# Patient Record
Sex: Female | Born: 1950 | Race: White | Hispanic: No | Marital: Married
Health system: Midwestern US, Community
[De-identification: ages and names within clinical notes are randomized; demographics above are authoritative.]

## PROBLEM LIST (undated history)

## (undated) DIAGNOSIS — N390 Urinary tract infection, site not specified: Secondary | ICD-10-CM

## (undated) DIAGNOSIS — I10 Essential (primary) hypertension: Secondary | ICD-10-CM

## (undated) DIAGNOSIS — E119 Type 2 diabetes mellitus without complications: Secondary | ICD-10-CM

## (undated) DIAGNOSIS — E1121 Type 2 diabetes mellitus with diabetic nephropathy: Secondary | ICD-10-CM

## (undated) DIAGNOSIS — F419 Anxiety disorder, unspecified: Secondary | ICD-10-CM

## (undated) DIAGNOSIS — E785 Hyperlipidemia, unspecified: Secondary | ICD-10-CM

## (undated) DIAGNOSIS — Z1211 Encounter for screening for malignant neoplasm of colon: Secondary | ICD-10-CM

## (undated) DIAGNOSIS — E079 Disorder of thyroid, unspecified: Secondary | ICD-10-CM

## (undated) DIAGNOSIS — H9203 Otalgia, bilateral: Secondary | ICD-10-CM

## (undated) LAB — HM MAMMOGRAPHY

## (undated) MED ORDER — HYDROCODONE 10 MG-CHLORPHENIRAMINE 8 MG/5 ML ORAL SUSP EXTEND.REL 12HR
10-8 mg/5 mL | Freq: Two times a day (BID) | ORAL | Status: DC | PRN
Start: ? — End: 2012-12-27

## (undated) MED ORDER — AMOXICILLIN CLAVULANATE 875 MG-125 MG TAB
875-125 mg | ORAL_TABLET | Freq: Two times a day (BID) | ORAL | Status: AC
Start: ? — End: 2013-01-11

## (undated) MED ORDER — HYDROCHLOROTHIAZIDE 25 MG TAB
25 mg | ORAL_TABLET | ORAL | Status: DC
Start: ? — End: 2013-11-10

## (undated) MED ORDER — CIPROFLOXACIN 500 MG TAB
500 mg | ORAL_TABLET | Freq: Two times a day (BID) | ORAL | Status: DC
Start: ? — End: 2013-01-01

## (undated) MED ORDER — METFORMIN SR 500 MG 24 HR TABLET
500 mg | ORAL_TABLET | Freq: Two times a day (BID) | ORAL | Status: DC
Start: ? — End: 2012-10-14

## (undated) MED ORDER — AZITHROMYCIN 250 MG TAB
250 mg | ORAL_TABLET | ORAL | Status: DC
Start: ? — End: 2012-10-21

## (undated) MED ORDER — LEVOTHYROXINE 50 MCG TAB
50 mcg | ORAL_TABLET | ORAL | Status: DC
Start: ? — End: 2013-11-10

## (undated) MED ORDER — ATORVASTATIN 40 MG TAB
40 mg | ORAL_TABLET | Freq: Every day | ORAL | Status: DC
Start: ? — End: 2012-10-21

## (undated) MED ORDER — AMLODIPINE 10 MG TAB
10 mg | ORAL_TABLET | ORAL | Status: DC
Start: ? — End: 2013-11-10

## (undated) MED ORDER — FLUOXETINE 20 MG CAP
20 mg | ORAL_CAPSULE | Freq: Two times a day (BID) | ORAL | Status: DC
Start: ? — End: 2013-11-10

## (undated) MED ORDER — AMLODIPINE 10 MG TAB
10 mg | ORAL_TABLET | Freq: Every day | ORAL | Status: DC
Start: ? — End: 2012-12-01

## (undated) MED ORDER — ALBUTEROL SULFATE HFA 90 MCG/ACTUATION AEROSOL INHALER
90 mcg/actuation | RESPIRATORY_TRACT | Status: DC | PRN
Start: ? — End: 2014-12-21

## (undated) MED ORDER — NITROFURANTOIN MACROCRYSTAL 100 MG CAP
100 mg | ORAL_CAPSULE | Freq: Every evening | ORAL | Status: DC
Start: ? — End: 2013-01-01

## (undated) MED ORDER — METHYLPREDNISOLONE 4 MG TABS IN A DOSE PACK
4 mg | PACK | ORAL | Status: DC
Start: ? — End: 2012-10-27

## (undated) MED ORDER — CEFTRIAXONE 1 GRAM SOLUTION FOR INJECTION
1 gram | Freq: Once | INTRAMUSCULAR | Status: AC
Start: ? — End: 2012-10-27

## (undated) MED ORDER — FLUTICASONE 50 MCG/ACTUATION NASAL SPRAY, SUSP
50 mcg/actuation | NASAL | Status: DC
Start: ? — End: 2014-12-21

## (undated) MED ORDER — LOVASTATIN 40 MG TAB
40 mg | ORAL_TABLET | Freq: Every evening | ORAL | Status: DC
Start: ? — End: 2012-10-07

## (undated) MED ORDER — LEVOFLOXACIN 750 MG TAB
750 mg | ORAL_TABLET | Freq: Every day | ORAL | Status: DC
Start: ? — End: 2012-10-31

## (undated) MED ORDER — HYDROCHLOROTHIAZIDE 25 MG TAB
25 mg | ORAL_TABLET | Freq: Every day | ORAL | Status: DC
Start: ? — End: 2012-12-01

## (undated) MED ORDER — METOPROLOL SUCCINATE SR 50 MG 24 HR TAB
50 mg | ORAL_TABLET | ORAL | Status: DC
Start: ? — End: 2013-11-10

## (undated) MED ORDER — LEVOFLOXACIN 750 MG TAB
750 mg | ORAL_TABLET | Freq: Every day | ORAL | Status: AC
Start: ? — End: 2012-10-26

## (undated) MED ORDER — PHENAZOPYRIDINE 200 MG TAB
200 mg | ORAL_TABLET | Freq: Three times a day (TID) | ORAL | Status: AC | PRN
Start: ? — End: 2013-01-04

## (undated) MED ORDER — METOPROLOL SUCCINATE SR 50 MG 24 HR TAB
50 mg | ORAL_TABLET | Freq: Every day | ORAL | Status: DC
Start: ? — End: 2012-12-01

## (undated) MED ORDER — PHENAZOPYRIDINE 200 MG TAB
200 mg | ORAL_TABLET | Freq: Three times a day (TID) | ORAL | Status: AC | PRN
Start: ? — End: 2012-12-30

## (undated) MED ORDER — METFORMIN SR 500 MG 24 HR TABLET
500 mg | ORAL_TABLET | Freq: Two times a day (BID) | ORAL | Status: DC
Start: ? — End: 2013-11-10

## (undated) MED ORDER — LEVOTHYROXINE 50 MCG TAB
50 mcg | ORAL_TABLET | Freq: Every day | ORAL | Status: DC
Start: ? — End: 2012-12-01

---

## 2012-09-24 NOTE — Telephone Encounter (Signed)
One year since visit, needs fasting labs.

## 2012-09-25 NOTE — Telephone Encounter (Signed)
Spoke with patient.

## 2012-10-06 LAB — AMB POC COMPLETE CBC,AUTOMATED ENTER
ABS. GRANS (POC): 4.5 10*3/uL (ref 2.2–4.8)
ABS. LYMPHS (POC): 2.6 10*3/uL (ref 1.3–2.9)
ABS. MONOS (POC): 0.5 10*3/uL (ref 0.3–0.8)
GRANULOCYTES (POC): 59.4 % (ref 43–65)
HCT (POC): 41.7 % (ref 37–47)
HGB (POC): 14.4 g/dL (ref 12–16)
LYMPHOCYTES (POC): 34.4 % (ref 20.5–45.5)
MCH (POC): 31.5 pg (ref 28–32)
MCHC (POC): 34.4 g/dL (ref 32–37)
MCV (POC): 91.5 fL (ref 81–99)
MONOCYTES (POC): 6.2 % (ref 5.5–11.7)
MPV (POC): 7.3 fL (ref 7–10.4)
PLATELET (POC): 232 10*3/uL (ref 150–450)
RBC (POC): 4.56 10*6/uL (ref 4.2–5.4)
RDW (POC): 12.4 % (ref 11.5–15.5)
WBC (POC): 7.5 10*3/uL (ref 4.5–10.5)

## 2012-10-06 NOTE — Progress Notes (Signed)
HISTORY OF PRESENT ILLNESS  Madison Peters is a 62 y.o. female.  HPIj Here for her annual exam. No chest pain, dyspnea, palpitations, edema, hypoglycemia. Compliant with meds, no side effects.    ROS as above  Past Medical History   Diagnosis Date   ??? Emphysema    ??? Hypertension    ??? Anxiety    ??? Thyroid disease      hypothryoidism   ??? Diabetes    ??? Headache    ??? Dyspepsia    ??? IGT (impaired glucose tolerance)    ??? Lipoma      Left calf   ??? Kidney stones      No current outpatient prescriptions on file prior to visit.     No current facility-administered medications on file prior to visit.     Allergies   Allergen Reactions   ??? Ace Inhibitors Cough   ??? Erythromycin Unknown (comments)   ??? Pcn (Penicillins) Unknown (comments)   ??? Sulfa (Sulfonamide Antibiotics) Nausea and Vomiting     BP 120/80   Pulse 64   Temp(Src) 99.1 ??F (37.3 ??C) (Oral)   Resp 12   Ht 5\' 5"  (1.651 m)   Wt 180 lb (81.647 kg)   BMI 29.95 kg/m2    Physical Exam   Vitals reviewed.  Constitutional: She appears well-developed and well-nourished. No distress.   HENT:   Head: Normocephalic and atraumatic.   Right Ear: External ear normal.   Left Ear: External ear normal.   Mouth/Throat: Oropharynx is clear and moist.   Nose clear mucus   Neck: No thyromegaly present.   Cardiovascular: Normal rate, regular rhythm, normal heart sounds and intact distal pulses.  Exam reveals no gallop.    Pulmonary/Chest: Effort normal and breath sounds normal. She has no wheezes. She has no rales.   Abdominal: Soft. Bowel sounds are normal.   Lymphadenopathy:     She has no cervical adenopathy.       ASSESSMENT and PLAN  Encounter Diagnoses   Name Primary?   ??? Diabetes Yes   ??? Thyroid disease    ??? Emphysema    ??? Hypertension    ??? Other and unspecified hyperlipidemia      Orders Placed This Encounter   ??? LIPID PANEL   ??? METABOLIC PANEL, COMPREHENSIVE   ??? HEMOGLOBIN A1C   ??? THYROID PANEL, FREE T4/TSH   ??? POC CBC w AUTO DIFF  (16109)   ??? lovastatin (MEVACOR) 40 mg tablet      Sig: Take 1 Tab by mouth nightly.     Dispense:  30 Tab     Refill:  11   ??? levothyroxine (SYNTHROID) 50 mcg tablet     Sig: Take 1 Tab by mouth Daily (before breakfast).     Dispense:  30 Tab     Refill:  11   ??? amLODIPine (NORVASC) 10 mg tablet     Sig: Take 1 Tab by mouth daily.     Dispense:  30 Tab     Refill:  11   ??? metoprolol succinate (TOPROL XL) 50 mg XL tablet     Sig: Take 1 Tab by mouth daily.     Dispense:  30 Tab     Refill:  11   ??? FLUoxetine (PROZAC) 20 mg capsule     Sig: Take 1 Cap by mouth two (2) times a day.     Dispense:  60 Cap     Refill:  11   ???  hydrochlorothiazide (HYDRODIURIL) 25 mg tablet     Sig: Take 1 Tab by mouth daily.     Dispense:  30 Tab     Refill:  11

## 2012-10-07 LAB — METABOLIC PANEL, COMPREHENSIVE
A-G Ratio: 2.5 (ref 1.1–2.5)
ALT (SGPT): 13 IU/L (ref 0–32)
AST (SGOT): 15 IU/L (ref 0–40)
Albumin: 4.9 g/dL — ABNORMAL HIGH (ref 3.6–4.8)
Alk. phosphatase: 65 IU/L (ref 39–117)
BUN/Creatinine ratio: 31 — ABNORMAL HIGH (ref 11–26)
BUN: 20 mg/dL (ref 8–27)
Bilirubin, total: 0.3 mg/dL (ref 0.0–1.2)
CO2: 18 mmol/L — ABNORMAL LOW (ref 19–28)
Calcium: 9.6 mg/dL (ref 8.6–10.2)
Chloride: 101 mmol/L (ref 97–108)
Creatinine: 0.64 mg/dL (ref 0.57–1.00)
GFR est AA: 112 mL/min/{1.73_m2} (ref >59)
GFR est non-AA: 97 mL/min/{1.73_m2} (ref >59)
GLOBULIN, TOTAL: 2 g/dL (ref 1.5–4.5)
Glucose: 219 mg/dL — ABNORMAL HIGH (ref 65–99)
Potassium: 4.1 mmol/L (ref 3.5–5.2)
Protein, total: 6.9 g/dL (ref 6.0–8.5)
Sodium: 139 mmol/L (ref 134–144)

## 2012-10-07 LAB — LIPID PANEL
Cholesterol, total: 265 mg/dL — ABNORMAL HIGH (ref 100–199)
HDL Cholesterol: 23 mg/dL — ABNORMAL LOW (ref >39)
Triglyceride: 1039 mg/dL — CR (ref 0–149)

## 2012-10-07 LAB — CVD REPORT: PDF IMAGE: 0

## 2012-10-07 LAB — HEMOGLOBIN A1C WITH EAG: Hemoglobin A1c: 8.9 % — ABNORMAL HIGH (ref 4.8–5.6)

## 2012-10-07 LAB — TSH AND FREE T4
T4, Free: 1.18 ng/dL (ref 0.82–1.77)
TSH: 1.85 u[IU]/mL (ref 0.450–4.500)

## 2012-10-07 NOTE — Telephone Encounter (Signed)
Sugars running high, will need to start metformin BID. Chol and Trig. VERY high, needs to switch from mevacor to atorvastatin and recheck in 3 months

## 2012-10-07 NOTE — Telephone Encounter (Signed)
LMFCB.

## 2012-10-08 NOTE — Telephone Encounter (Signed)
Left message for call back

## 2012-10-09 NOTE — Telephone Encounter (Signed)
Patient notified about lab results and needs appt to discuss medications and abnormal labs

## 2012-10-11 NOTE — Telephone Encounter (Signed)
Notified pt of chol results

## 2012-10-11 NOTE — Telephone Encounter (Signed)
CHol and triglycerides are very high, take the atorvastatin and recheck in 3 months

## 2012-10-14 NOTE — Progress Notes (Signed)
HISTORY OF PRESENT ILLNESS  Madison Peters is a 62 y.o. female.  HPI Several days of productive cough with green sputum, sinus congestion fever to 102, chills, and diarrhea. Here to discuss her test results.    ROS as Sheila Oats  Past Medical History   Diagnosis Date   ??? Emphysema    ??? Hypertension    ??? Anxiety    ??? Thyroid disease      hypothryoidism   ??? Diabetes    ??? Headache    ??? Dyspepsia    ??? IGT (impaired glucose tolerance)    ??? Lipoma      Left calf   ??? Kidney stones      Current Outpatient Prescriptions on File Prior to Visit   Medication Sig Dispense Refill   ??? levothyroxine (SYNTHROID) 50 mcg tablet Take 1 Tab by mouth Daily (before breakfast).  30 Tab  11   ??? amLODIPine (NORVASC) 10 mg tablet Take 1 Tab by mouth daily.  30 Tab  11   ??? metoprolol succinate (TOPROL XL) 50 mg XL tablet Take 1 Tab by mouth daily.  30 Tab  11   ??? FLUoxetine (PROZAC) 20 mg capsule Take 1 Cap by mouth two (2) times a day.  60 Cap  11   ??? hydrochlorothiazide (HYDRODIURIL) 25 mg tablet Take 1 Tab by mouth daily.  30 Tab  11     No current facility-administered medications on file prior to visit.     Allergies   Allergen Reactions   ??? Ace Inhibitors Cough   ??? Erythromycin Unknown (comments)   ??? Pcn (Penicillins) Unknown (comments)   ??? Sulfa (Sulfonamide Antibiotics) Nausea and Vomiting     BP 130/80   Pulse 82   Temp(Src) 97.8 ??F (36.6 ??C) (Oral)   Ht 5\' 5"  (1.651 m)   Wt 183 lb (83.008 kg)   BMI 30.45 kg/m2    Physical Exam   Constitutional: She appears well-developed and well-nourished. No distress.   Neck: No thyromegaly present.   Cardiovascular: Normal rate, regular rhythm and normal heart sounds.    Pulmonary/Chest: Effort normal. She has decreased breath sounds in the left middle field and the left lower field. She has wheezes in the left middle field and the left lower field.   Lymphadenopathy:     She has no cervical adenopathy.       ASSESSMENT and PLAN  Encounter Diagnoses   Name Primary?   ??? Pneumonia, organism unspecified  Yes   ??? Emphysema    ??? Diabetes    ??? Other and unspecified hyperlipidemia      Orders Placed This Encounter   ??? azithromycin (ZITHROMAX) 250 mg tablet     Sig: Take 2 tablets today, then take 1 tablet daily     Dispense:  6 Tab     Refill:  0   ??? atorvastatin (LIPITOR) 40 mg tablet     Sig: Take 1 Tab by mouth daily.     Dispense:  30 Tab     Refill:  11   ??? metFORMIN ER (GLUCOPHAGE XR) 500 mg tablet     Sig: Take 1 Tab by mouth two (2) times a day.     Dispense:  60 Tab     Refill:  11   Robitussin AC   6 oz   1 RF

## 2012-10-14 NOTE — Patient Instructions (Addendum)
MyChart Activation    Thank you for requesting access to MyChart. Please follow the instructions below to securely access and download your online medical record. MyChart allows you to send messages to your doctor, view your test results, renew your prescriptions, schedule appointments, and more.    How Do I Sign Up?    1. In your internet browser, go to [email or web address]  2. Click on the First Time User? Click Here link in the Sign In box. You will be redirect to the New Member Sign Up page.  3. Enter your MyChart Access Code exactly as it appears below. You will not need to use this code after you???ve completed the sign-up process. If you do not sign up before the expiration date, you must request a new code.    MyChart Access Code: [email or web address]  Expires: 01/12/2013  4:05 PM (This is the date your MyChart access code will expire)    4. Enter the last four digits of your Social Security Number (xxxx) and Date of Birth (mm/dd/yyyy) as indicated and click Submit. You will be taken to the next sign-up page.  5. Create a MyChart ID. This will be your MyChart login ID and cannot be changed, so think of one that is secure and easy to remember.  6. Create a MyChart password. You can change your password at any time.  7. Enter your Password Reset Question and Answer. This can be used at a later time if you forget your password.   8. Enter your e-mail address. You will receive e-mail notification when new information is available in MyChart.  9. Click Sign Up. You can now view and download portions of your medical record.  10. Click the Download Summary menu link to download a portable copy of your medical information.    Additional Information    If you have questions, please visit the Frequently Asked Questions section of the MyChart website at [email or web address] Remember, MyChart is NOT to be used for urgent needs. For medical emergencies, dial 911.

## 2012-10-21 LAB — AMB POC COMPLETE CBC,AUTOMATED ENTER
ABS. GRANS (POC): 7.1 10*3/uL — AB (ref 2.2–4.8)
ABS. LYMPHS (POC): 2.9 10*3/uL (ref 1.3–2.9)
ABS. MONOS (POC): 0.4 10*3/uL (ref 0.3–0.8)
GRANULOCYTES (POC): 68.5 % — AB (ref 43–65)
HCT (POC): 38.4 % (ref 37–47)
HGB (POC): 12.7 g/dL (ref 12–16)
LYMPHOCYTES (POC): 27.6 % (ref 20.5–45.5)
MCH (POC): 30.6 pg (ref 28–32)
MCHC (POC): 33 g/dL (ref 32–37)
MCV (POC): 92.7 fL (ref 81–99)
MONOCYTES (POC): 3.9 % — AB (ref 5.5–11.7)
MPV (POC): 7.1 fL (ref 7–10.4)
PLATELET (POC): 338 10*3/uL (ref 150–450)
RBC (POC): 4.14 10*6/uL — AB (ref 4.2–5.4)
RDW (POC): 12.5 % (ref 11.5–15.5)
WBC (POC): 10.4 10*3/uL (ref 4.5–10.5)

## 2012-10-21 NOTE — Patient Instructions (Addendum)
MyChart Activation    Thank you for requesting access to MyChart. Please follow the instructions below to securely access and download your online medical record. MyChart allows you to send messages to your doctor, view your test results, renew your prescriptions, schedule appointments, and more.    How Do I Sign Up?    1. In your internet browser, go to [email or web address]  2. Click on the First Time User? Click Here link in the Sign In box. You will be redirect to the New Member Sign Up page.  3. Enter your MyChart Access Code exactly as it appears below. You will not need to use this code after you???ve completed the sign-up process. If you do not sign up before the expiration date, you must request a new code.    MyChart Access Code: [email or web address]  Expires: 01/12/2013  4:05 PM (This is the date your MyChart access code will expire)    4. Enter the last four digits of your Social Security Number (xxxx) and Date of Birth (mm/dd/yyyy) as indicated and click Submit. You will be taken to the next sign-up page.  5. Create a MyChart ID. This will be your MyChart login ID and cannot be changed, so think of one that is secure and easy to remember.  6. Create a MyChart password. You can change your password at any time.  7. Enter your Password Reset Question and Answer. This can be used at a later time if you forget your password.   8. Enter your e-mail address. You will receive e-mail notification when new information is available in MyChart.  9. Click Sign Up. You can now view and download portions of your medical record.  10. Click the Download Summary menu link to download a portable copy of your medical information.    Additional Information    If you have questions, please visit the Frequently Asked Questions section of the MyChart website at [email or web address] Remember, MyChart is NOT to be used for urgent needs. For medical emergencies, dial 911.

## 2012-10-21 NOTE — Progress Notes (Signed)
HISTORY OF PRESENT ILLNESS  Madison Peters is a 62 y.o. female.  Chief Complaint   Patient presents with   ??? Pneumonia     has finished Z-pack and she is not better   ??? Diabetes     has not started metformin yet   ??? Cholesterol Problem     has been taking lovastatin and was changed to lipitor; wanted to know if she should change       HPI  Saw Dr. Grier Rocher, diagnosed her with Pneumonia, got Zpak and cough med Robitussin AC, but it burned her throat and she stopped it  Pt prefers Tussionex  Still with same symptoms, cough productive with dark phlegm, now a little lighter, yellow  Current smoker although has cut down    DM  Had BW recently, is changing her diet and will start Metformin    Hyperlipidemia  Did not take Lovastatin regularly in the past    Review of Systems   Constitutional: Positive for fever (101).   HENT: Positive for ear pain, congestion, sore throat and ear discharge.    Respiratory: Positive for cough, sputum production and wheezing. Negative for shortness of breath.    Gastrointestinal: Negative for nausea, vomiting and diarrhea.   Neurological: Negative for headaches.     Past Medical History   Diagnosis Date   ??? Emphysema    ??? Hypertension    ??? Anxiety    ??? Thyroid disease      hypothryoidism   ??? Diabetes    ??? Headache    ??? Dyspepsia    ??? IGT (impaired glucose tolerance)    ??? Lipoma      Left calf   ??? Kidney stones      Current Outpatient Prescriptions   Medication Sig Dispense Refill   ??? lovastatin (MEVACOR) 40 mg tablet Take 40 mg by mouth nightly.       ??? chlorpheniramine-HYDROcodone (TUSSIONEX) 8-10 mg/5 mL suspension Take 5 mL by mouth every twelve (12) hours as needed for Cough.  100 mL  0   ??? levofloxacin (LEVAQUIN) 750 mg tablet Take 1 Tab by mouth daily for 5 days.  5 Tab  0   ??? methylPREDNISolone (MEDROL DOSEPACK) 4 mg tablet Per dose pack instructions  1 Package  0   ??? albuterol (PROVENTIL HFA, VENTOLIN HFA) 90 mcg/actuation inhaler Take 1 Puff by inhalation every four (4) hours as  needed for Wheezing.  1 Inhaler  0   ??? metFORMIN ER (GLUCOPHAGE XR) 500 mg tablet Take 1 Tab by mouth two (2) times a day.  60 Tab  11   ??? levothyroxine (SYNTHROID) 50 mcg tablet Take 1 Tab by mouth Daily (before breakfast).  30 Tab  11   ??? amLODIPine (NORVASC) 10 mg tablet Take 1 Tab by mouth daily.  30 Tab  11   ??? metoprolol succinate (TOPROL XL) 50 mg XL tablet Take 1 Tab by mouth daily.  30 Tab  11   ??? FLUoxetine (PROZAC) 20 mg capsule Take 1 Cap by mouth two (2) times a day.  60 Cap  11   ??? hydrochlorothiazide (HYDRODIURIL) 25 mg tablet Take 1 Tab by mouth daily.  30 Tab  11     Allergies   Allergen Reactions   ??? Ace Inhibitors Cough   ??? Erythromycin Unknown (comments)   ??? Pcn (Penicillins) Unknown (comments)   ??? Sulfa (Sulfonamide Antibiotics) Nausea and Vomiting     BP 130/80   Pulse 80  Temp(Src) 98.9 ??F (37.2 ??C) (Oral)   Resp 20   Ht 5\' 5"  (1.651 m)   Wt 180 lb (81.647 kg)   BMI 29.95 kg/m2   SpO2 90%    Physical Exam   Constitutional: She is oriented to person, place, and time. She appears well-developed and well-nourished. No distress.   HENT:   Head: Normocephalic and atraumatic.   Nose: Nose normal.   Mouth/Throat: Oropharynx is clear and moist. No oropharyngeal exudate.   Cerumen bilat     Eyes: Conjunctivae and EOM are normal.   Cardiovascular: Normal rate, regular rhythm and normal heart sounds.    Pulmonary/Chest: Effort normal. She has wheezes. She has no rales.   Lymphadenopathy:     She has no cervical adenopathy.   Neurological: She is alert and oriented to person, place, and time.   Skin: Skin is warm and dry.   Psychiatric: She has a normal mood and affect.     Recent Results (from the past 12 hour(s))   AMB POC COMPLETE CBC,AUTOMATED ENTER    Collection Time     10/21/12 11:15 AM       Result Value Range    WBC (POC) 10.4  4.5 - 10.5 10^3/ul    RBC (POC) 4.14 (*) 4.2 - 5.4 10^6/ul    HGB (POC) 12.7  12 - 16 g/dL    HCT (POC) 16.1  37 - 47 %    MCV (POC) 92.7  81 - 99 fL    MCH (POC) 30.6   28 - 32 pg    MCHC (POC) 33.0  32 - 37 g/dL    RDW (POC) 09.6  04.5 - 15.5 %    PLATELET (POC) 338  150 - 450 10^3/ul    MPV (POC) 7.1  7 - 10.4 fL    LYMPHOCYTES (POC) 27.6  20.5 - 45.5 %    ABS. LYMPHS (POC) 2.9  1.3 - 2.9 10^3/ul    MONOCYTES (POC) 3.9 (*) 5.5 - 11.7 %    ABS. MONOS (POC) 0.4  0.3 - 0.8 10^3/ul    GRANULOCYTES (POC) 68.5 (*) 43 - 65 %    ABS. GRANS (POC) 7.1 (*) 2.2 - 4.8 10^3/ul       ASSESSMENT and PLAN  1. Pneumonia  AMB POC COMPLETE CBC,AUTOMATED ENTER, COLLECTION CAPILLARY BLOOD SPECIMEN, chlorpheniramine-HYDROcodone (TUSSIONEX) 8-10 mg/5 mL suspension, levofloxacin (LEVAQUIN) 750 mg tablet   2. RAD (reactive airway disease)  INHAL RX, AIRWAY OBST/DX SPUTUM INDUCT, methylPREDNISolone (MEDROL DOSEPACK) 4 mg tablet, albuterol (PROVENTIL HFA, VENTOLIN HFA) 90 mcg/actuation inhaler   3. Impacted cerumen of both ears  REMOVE IMPACTED EAR WAX   4. Diabetes  Start Metformin and cont with healthy diet and low Conc Sweets   5. Hyperlipidemia  Start Lipitor since more effective   6. Tobacco abuse  Urged to quit

## 2012-10-25 NOTE — Telephone Encounter (Signed)
Will need f/u visit to see if pneumonia cleared and to adjust diabetes medication

## 2012-10-27 LAB — AMB POC COMPLETE CBC,AUTOMATED ENTER
ABS. GRANS (POC): 7.6 10*3/uL — AB (ref 2.2–4.8)
ABS. LYMPHS (POC): 3.5 10*3/uL — AB (ref 1.3–2.9)
ABS. MONOS (POC): 0.4 10*3/uL (ref 0.3–0.8)
GRANULOCYTES (POC): 66 % — AB (ref 43–65)
HCT (POC): 35.9 % — AB (ref 37–47)
HGB (POC): 12 g/dL (ref 12–16)
LYMPHOCYTES (POC): 30.1 % (ref 20.5–45.5)
MCH (POC): 30.7 pg (ref 28–32)
MCHC (POC): 33.5 g/dL (ref 32–37)
MCV (POC): 91.5 fL (ref 81–99)
MONOCYTES (POC): 3.9 % — AB (ref 5.5–11.7)
MPV (POC): 7 fL (ref 7–10.4)
PLATELET (POC): 239 10*3/uL (ref 150–450)
RBC (POC): 3.92 10*6/uL — AB (ref 4.2–5.4)
RDW (POC): 12.5 % (ref 11.5–15.5)
WBC (POC): 11.5 10*3/uL — AB (ref 4.5–10.5)

## 2012-10-27 NOTE — Progress Notes (Signed)
Rocephin 1gram IM rt glute patient remained in office x ten min to observe for any reactions

## 2012-10-27 NOTE — Progress Notes (Signed)
HISTORY OF PRESENT ILLNESS  Madison Peters is a 62 y.o. female.  Chief Complaint   Patient presents with   ??? Pneumonia     f/u       HPI  Here for F/U of Pneumonia  Had Zpak and then Levaquin that she finished 2 d ago  Finished steroids yesterday  Felling better  Coughing less and no SOB now  Cutting down on smoking  Has not had CXR    Review of Systems   Constitutional: Positive for fever.   HENT: Negative for congestion.    Respiratory: Positive for cough (but better) and sputum production. Negative for shortness of breath and wheezing.    Gastrointestinal: Negative for nausea, vomiting and diarrhea.     Past Medical History   Diagnosis Date   ??? Emphysema    ??? Hypertension    ??? Anxiety    ??? Thyroid disease      hypothryoidism   ??? Diabetes    ??? Headache    ??? Dyspepsia    ??? IGT (impaired glucose tolerance)    ??? Lipoma      Left calf   ??? Kidney stones      Current Outpatient Prescriptions   Medication Sig Dispense Refill   ??? cefTRIAXone (ROCEPHIN) 1 gram injection 1 g by IntraMUSCular route once for 1 dose.  1 Vial  0   ??? levofloxacin (LEVAQUIN) 750 mg tablet Take 1 Tab by mouth daily for 5 days.  5 Tab  0   ??? chlorpheniramine-HYDROcodone (TUSSIONEX) 8-10 mg/5 mL suspension Take 5 mL by mouth every twelve (12) hours as needed for Cough.  100 mL  0   ??? albuterol (PROVENTIL HFA, VENTOLIN HFA) 90 mcg/actuation inhaler Take 1 Puff by inhalation every four (4) hours as needed for Wheezing.  1 Inhaler  0   ??? levothyroxine (SYNTHROID) 50 mcg tablet Take 1 Tab by mouth Daily (before breakfast).  30 Tab  11   ??? amLODIPine (NORVASC) 10 mg tablet Take 1 Tab by mouth daily.  30 Tab  11   ??? metoprolol succinate (TOPROL XL) 50 mg XL tablet Take 1 Tab by mouth daily.  30 Tab  11   ??? FLUoxetine (PROZAC) 20 mg capsule Take 1 Cap by mouth two (2) times a day.  60 Cap  11   ??? hydrochlorothiazide (HYDRODIURIL) 25 mg tablet Take 1 Tab by mouth daily.  30 Tab  11   ??? lovastatin (MEVACOR) 40 mg tablet Take 40 mg by mouth nightly.       ???  [EXPIRED] levofloxacin (LEVAQUIN) 750 mg tablet Take 1 Tab by mouth daily for 5 days.  5 Tab  0   ??? metFORMIN ER (GLUCOPHAGE XR) 500 mg tablet Take 1 Tab by mouth two (2) times a day.  60 Tab  11     Allergies   Allergen Reactions   ??? Ace Inhibitors Cough   ??? Erythromycin Unknown (comments)   ??? Pcn (Penicillins) Unknown (comments)   ??? Sulfa (Sulfonamide Antibiotics) Nausea and Vomiting     BP 110/72   Pulse 72   Temp(Src) 99.5 ??F (37.5 ??C) (Oral)   Resp 16   Ht 5\' 5"  (1.651 m)   Wt 182 lb (82.555 kg)   BMI 30.29 kg/m2    Physical Exam   Constitutional: She is oriented to person, place, and time. She appears well-developed and well-nourished. No distress.   HENT:   Head: Normocephalic and atraumatic.   Mouth/Throat:  Oropharynx is clear and moist. No oropharyngeal exudate.   Eyes: Conjunctivae and EOM are normal.   Cardiovascular: Normal rate, regular rhythm and normal heart sounds.    No murmur heard.  Pulmonary/Chest: Effort normal and breath sounds normal. No respiratory distress. She has no wheezes. She has no rales.   Lymphadenopathy:     She has no cervical adenopathy.   Neurological: She is alert and oriented to person, place, and time. No cranial nerve deficit.   Skin: Skin is warm and dry.   Psychiatric: She has a normal mood and affect.     Recent Results (from the past 12 hour(s))   AMB POC COMPLETE CBC,AUTOMATED ENTER    Collection Time     10/27/12 11:00 AM       Result Value Range    WBC (POC) 11.5 (*) 4.5 - 10.5 10^3/ul    RBC (POC) 3.92 (*) 4.2 - 5.4 10^6/ul    HGB (POC) 12.0  12 - 16 g/dL    HCT (POC) 16.1 (*) 37 - 47 %    MCV (POC) 91.5  81 - 99 fL    MCH (POC) 30.7  28 - 32 pg    MCHC (POC) 33.5  32 - 37 g/dL    RDW (POC) 09.6  04.5 - 15.5 %    PLATELET (POC) 239  150 - 450 10^3/ul    MPV (POC) 7.0  7 - 10.4 fL    LYMPHOCYTES (POC) 30.1  20.5 - 45.5 %    ABS. LYMPHS (POC) 3.5 (*) 1.3 - 2.9 10^3/ul    MONOCYTES (POC) 3.9 (*) 5.5 - 11.7 %    ABS. MONOS (POC) 0.4  0.3 - 0.8 10^3/ul    GRANULOCYTES  (POC) 66.0 (*) 43 - 65 %    ABS. GRANS (POC) 7.6 (*) 2.2 - 4.8 10^3/ul       ASSESSMENT and PLAN  1. Pneumonia -  WBC higher than last visit AMB POC COMPLETE CBC,AUTOMATED ENTER, COLLECTION CAPILLARY BLOOD SPECIMEN, XR CHEST PA LAT, cefTRIAXone (ROCEPHIN) 1 gram injection, CEFTRIAXONE SODIUM INJECTION PER 250 MG, PR THER/PROPH/DIAG INJECTION, SUBCUT/IM, levofloxacin (LEVAQUIN) 750 mg tablet  F/U in 4 d, RTC sooner if worse

## 2012-10-27 NOTE — Patient Instructions (Addendum)
MyChart Activation    Thank you for requesting access to MyChart. Please follow the instructions below to securely access and download your online medical record. MyChart allows you to send messages to your doctor, view your test results, renew your prescriptions, schedule appointments, and more.    How Do I Sign Up?    1. In your internet browser, go to [email or web address]  2. Click on the First Time User? Click Here link in the Sign In box. You will be redirect to the New Member Sign Up page.  3. Enter your MyChart Access Code exactly as it appears below. You will not need to use this code after you???ve completed the sign-up process. If you do not sign up before the expiration date, you must request a new code.    MyChart Access Code: [email or web address]  Expires: 01/12/2013  4:05 PM (This is the date your MyChart access code will expire)    4. Enter the last four digits of your Social Security Number (xxxx) and Date of Birth (mm/dd/yyyy) as indicated and click Submit. You will be taken to the next sign-up page.  5. Create a MyChart ID. This will be your MyChart login ID and cannot be changed, so think of one that is secure and easy to remember.  6. Create a MyChart password. You can change your password at any time.  7. Enter your Password Reset Question and Answer. This can be used at a later time if you forget your password.   8. Enter your e-mail address. You will receive e-mail notification when new information is available in MyChart.  9. Click Sign Up. You can now view and download portions of your medical record.  10. Click the Download Summary menu link to download a portable copy of your medical information.    Additional Information    If you have questions, please visit the Frequently Asked Questions section of the MyChart website at [email or web address] Remember, MyChart is NOT to be used for urgent needs. For medical emergencies, dial 911.

## 2012-10-31 LAB — AMB POC COMPLETE CBC,AUTOMATED ENTER
ABS. GRANS (POC): 10.5 10*3/uL — AB (ref 2.2–6.5)
ABS. LYMPHS (POC): 3 10*3/uL (ref 1.2–3.4)
ABS. MONOS (POC): 0.8 10*3/uL (ref 0.3–0.8)
GRANULOCYTES (POC): 73.4 % (ref 43–75.2)
HCT (POC): 41.8 % (ref 37–47)
HGB (POC): 14.1 g/dL (ref 12–16)
LYMPHOCYTES (POC): 21.3 % (ref 20.5–45.5)
MCH (POC): 30.9 pg (ref 28–32)
MCHC (POC): 33.7 g/dL (ref 32–37)
MCV (POC): 91.5 fL (ref 81–99)
MONOCYTES (POC): 5.3 % (ref 1.7–9.3)
MPV (POC): 6.9 fL — AB (ref 7.8–11.0)
PLATELET (POC): 241 10*3/uL (ref 150–450)
RBC (POC): 4.57 10*6/uL (ref 4.2–5.4)
RDW (POC): 12.4 % (ref 11.5–15.5)
WBC (POC): 14.3 10*3/uL — AB (ref 4.5–10.5)

## 2012-10-31 NOTE — Patient Instructions (Addendum)
MyChart Activation    Thank you for requesting access to MyChart. Please follow the instructions below to securely access and download your online medical record. MyChart allows you to send messages to your doctor, view your test results, renew your prescriptions, schedule appointments, and more.    How Do I Sign Up?    1. In your internet browser, go to [email or web address]  2. Click on the First Time User? Click Here link in the Sign In box. You will be redirect to the New Member Sign Up page.  3. Enter your MyChart Access Code exactly as it appears below. You will not need to use this code after you???ve completed the sign-up process. If you do not sign up before the expiration date, you must request a new code.    MyChart Access Code: [email or web address]  Expires: 01/12/2013  4:05 PM (This is the date your MyChart access code will expire)    4. Enter the last four digits of your Social Security Number (xxxx) and Date of Birth (mm/dd/yyyy) as indicated and click Submit. You will be taken to the next sign-up page.  5. Create a MyChart ID. This will be your MyChart login ID and cannot be changed, so think of one that is secure and easy to remember.  6. Create a MyChart password. You can change your password at any time.  7. Enter your Password Reset Question and Answer. This can be used at a later time if you forget your password.   8. Enter your e-mail address. You will receive e-mail notification when new information is available in MyChart.  9. Click Sign Up. You can now view and download portions of your medical record.  10. Click the Download Summary menu link to download a portable copy of your medical information.    Additional Information    If you have questions, please visit the Frequently Asked Questions section of the MyChart website at [email or web address] Remember, MyChart is NOT to be used for urgent needs. For medical emergencies, dial 911.

## 2012-10-31 NOTE — Progress Notes (Signed)
HISTORY OF PRESENT ILLNESS  Madison Peters is a 62 y.o. female.  Chief Complaint   Patient presents with   ??? Pneumonia     follow up       HPI  Finished last Levaquin this AM  Off the steroids since last visit  Felling better  CXR was negaive  Still feels tired  Uses inhaler twice a day and helping  Is trying to quit Tob      Review of Systems   Constitutional: Negative for fever and malaise/fatigue.   HENT: Negative for congestion.    Respiratory: Positive for cough (nl smokers cough) and sputum production (clear phlegm now). Negative for shortness of breath and wheezing.    Gastrointestinal: Negative for nausea, vomiting and abdominal pain.   Genitourinary: Negative.      Past Medical History   Diagnosis Date   ??? Emphysema    ??? Hypertension    ??? Anxiety    ??? Thyroid disease      hypothryoidism   ??? Diabetes    ??? Headache    ??? Dyspepsia    ??? IGT (impaired glucose tolerance)    ??? Lipoma      Left calf   ??? Kidney stones      Current Outpatient Prescriptions   Medication Sig Dispense Refill   ??? lovastatin (MEVACOR) 40 mg tablet Take 40 mg by mouth nightly.       ??? chlorpheniramine-HYDROcodone (TUSSIONEX) 8-10 mg/5 mL suspension Take 5 mL by mouth every twelve (12) hours as needed for Cough.  100 mL  0   ??? albuterol (PROVENTIL HFA, VENTOLIN HFA) 90 mcg/actuation inhaler Take 1 Puff by inhalation every four (4) hours as needed for Wheezing.  1 Inhaler  0   ??? metFORMIN ER (GLUCOPHAGE XR) 500 mg tablet Take 1 Tab by mouth two (2) times a day.  60 Tab  11   ??? levothyroxine (SYNTHROID) 50 mcg tablet Take 1 Tab by mouth Daily (before breakfast).  30 Tab  11   ??? amLODIPine (NORVASC) 10 mg tablet Take 1 Tab by mouth daily.  30 Tab  11   ??? metoprolol succinate (TOPROL XL) 50 mg XL tablet Take 1 Tab by mouth daily.  30 Tab  11   ??? FLUoxetine (PROZAC) 20 mg capsule Take 1 Cap by mouth two (2) times a day.  60 Cap  11   ??? hydrochlorothiazide (HYDRODIURIL) 25 mg tablet Take 1 Tab by mouth daily.  30 Tab  11     Allergies    Allergen Reactions   ??? Ace Inhibitors Cough   ??? Erythromycin Unknown (comments)   ??? Pcn (Penicillins) Unknown (comments)   ??? Sulfa (Sulfonamide Antibiotics) Nausea and Vomiting     BP 150/88   Pulse 70   Temp(Src) 98.1 ??F (36.7 ??C) (Oral)   Resp 18   Ht 5\' 5"  (1.651 m)   Wt 181 lb (82.101 kg)   BMI 30.12 kg/m2   SpO2 97%    Physical Exam   Constitutional: She is oriented to person, place, and time. She appears well-developed and well-nourished. No distress.   HENT:   Head: Normocephalic and atraumatic.   Mouth/Throat: No oropharyngeal exudate.   Eyes: Conjunctivae and EOM are normal.   Cardiovascular: Normal rate, regular rhythm and normal heart sounds.    No murmur heard.  Pulmonary/Chest: Effort normal and breath sounds normal. No respiratory distress. She has no wheezes. She has no rales.   Lymphadenopathy:  She has no cervical adenopathy.   Neurological: She is oriented to person, place, and time.   Skin: Skin is warm and dry.   Psychiatric: She has a normal mood and affect.     Recent Results (from the past 12 hour(s))   AMB POC COMPLETE CBC,AUTOMATED ENTER    Collection Time     10/31/12 10:15 AM       Result Value Range    WBC (POC) 14.3 (*) 4.5 - 10.5 10^3/ul    RBC (POC) 4.57  4.2 - 5.4 10^6/ul    HGB (POC) 14.1  12 - 16 g/dL    HCT (POC) 16.1  37 - 47 %    MCV (POC) 91.5  81 - 99 fL    MCH (POC) 30.9  28 - 32 pg    MCHC (POC) 33.7  32 - 37 g/dL    RDW (POC) 09.6  04.5 - 15.5 %    PLATELET (POC) 241  150 - 450 10^3/ul    MPV (POC) 6.9 (*) 7.8 - 11.0 fL    LYMPHOCYTES (POC) 21.3  20.5 - 45.5 %    ABS. LYMPHS (POC) 3.0  1.2 - 3.4 10^3/ul    MONOCYTES (POC) 5.3  1.7 - 9.3 %    ABS. MONOS (POC) 0.8  0.3 - 0.8 10^3/ul    GRANULOCYTES (POC) 73.4  43 - 75.2 %    ABS. GRANS (POC) 10.5 (*) 2.2 - 6.5 10^3/ul       ASSESSMENT and PLAN  1. Pneumonia  AMB POC COMPLETE CBC,AUTOMATED ENTER, COLLECTION CAPILLARY BLOOD SPECIMEN   2. Leukocytosis  PATHOLOGIST REVIEW SMEARS, CANCELED: PATHOLOGIST REVIEW SMEARS

## 2012-11-08 LAB — PATHOLOGIST REVIEW SMEARS
Platelets: NORMAL
RBC: NORMAL

## 2012-11-08 NOTE — Telephone Encounter (Signed)
Spoke with patient.

## 2012-11-08 NOTE — Telephone Encounter (Signed)
LMFCB.

## 2012-11-08 NOTE — Telephone Encounter (Signed)
Message copied by Lance Morin on Sat Nov 08, 2012 11:29 AM  ------       Message from: Philemon Kingdom       Created: Sat Nov 08, 2012  6:53 AM         Call pt, the blood smear  Shows changes d/t infection  ------

## 2012-11-08 NOTE — Progress Notes (Signed)
Quick Note:    Call pt, the blood smear Shows changes d/t infection  ______

## 2012-12-01 NOTE — Telephone Encounter (Signed)
Please get refills at visits

## 2012-12-27 LAB — AMB POC URINALYSIS DIP STICK MANUAL W/O MICRO
Bilirubin (UA POC): NEGATIVE
Glucose (UA POC): NEGATIVE
Ketones (UA POC): NEGATIVE
Nitrites (UA POC): POSITIVE
Specific gravity (UA POC): 1.02 (ref 1.001–1.035)
Urobilinogen (UA POC): 0.2 (ref 0.2–1)
pH (UA POC): 6 (ref 4.6–8.0)

## 2012-12-27 NOTE — Progress Notes (Signed)
HISTORY OF PRESENT ILLNESS  Madison Peters is a 62 y.o. female.   Chief Complaint   Patient presents with   ??? Urinary Pain       HPI  Started to have urinary Sy3-4 d ago  Tried home remedies and cranberry juice and water  To point of diarrhea  Decreased sleep  Has been cutting down on smoking    Review of Systems   Constitutional: Negative for fever.   Respiratory: Positive for cough (from smoking).    Gastrointestinal: Positive for abdominal pain and diarrhea.   Genitourinary: Positive for dysuria, urgency and frequency. Negative for hematuria and flank pain.   Musculoskeletal: Positive for back pain.     Past Medical History   Diagnosis Date   ??? Emphysema    ??? Hypertension    ??? Anxiety    ??? Thyroid disease      hypothryoidism   ??? Diabetes    ??? Headache    ??? Dyspepsia    ??? IGT (impaired glucose tolerance)    ??? Lipoma      Left calf   ??? Kidney stones      Current Outpatient Prescriptions   Medication Sig Dispense Refill   ??? ciprofloxacin (CIPRO) 500 mg tablet Take 1 Tab by mouth two (2) times a day for 5 days.  10 Tab  0   ??? phenazopyridine (PYRIDIUM) 200 mg tablet Take 1 Tab by mouth three (3) times daily as needed for Pain for 3 days.  9 Tab  0   ??? amLODIPine (NORVASC) 10 mg tablet TAKE 1 TABLET BY MOUTH EVERY DAY  30 Tab  10   ??? levothyroxine (SYNTHROID) 50 mcg tablet TAKE 1 TABLET BY MOUTH EVERY DAY  30 Tab  10   ??? hydrochlorothiazide (HYDRODIURIL) 25 mg tablet TAKE 1 TABLET BY MOUTH EVERY DAY  30 Tab  10   ??? metoprolol succinate (TOPROL-XL) 50 mg XL tablet TAKE 1 TABLET BY MOUTH EVERY DAY  30 Tab  10   ??? lovastatin (MEVACOR) 40 mg tablet Take 40 mg by mouth nightly.       ??? albuterol (PROVENTIL HFA, VENTOLIN HFA) 90 mcg/actuation inhaler Take 1 Puff by inhalation every four (4) hours as needed for Wheezing.  1 Inhaler  0   ??? metFORMIN ER (GLUCOPHAGE XR) 500 mg tablet Take 1 Tab by mouth two (2) times a day.  60 Tab  11   ??? FLUoxetine (PROZAC) 20 mg capsule Take 1 Cap by mouth two (2) times a day.  60 Cap  11      Allergies   Allergen Reactions   ??? Ace Inhibitors Cough   ??? Erythromycin Unknown (comments)   ??? Sulfa (Sulfonamide Antibiotics) Nausea and Vomiting     BP 140/80   Pulse 100   Temp(Src) 98.4 ??F (36.9 ??C) (Oral)   Ht 5\' 5"  (1.651 m)   Wt 181 lb (82.101 kg)   BMI 30.12 kg/m2    Physical Exam   Nursing note and vitals reviewed.  Constitutional: She is oriented to person, place, and time. She appears well-developed and well-nourished.   HENT:   Head: Normocephalic and atraumatic.   Eyes: Conjunctivae and EOM are normal. Pupils are equal, round, and reactive to light.   Cardiovascular: Normal rate, regular rhythm and normal heart sounds.    Pulmonary/Chest: Effort normal and breath sounds normal. No respiratory distress. She has no wheezes. She has no rales.   Abdominal: Soft. She exhibits no distension  and no mass. There is tenderness (suprapubic, no CVA tenderness). There is no guarding.   Neurological: She is alert and oriented to person, place, and time.     Recent Results (from the past 12 hour(s))   AMB POC URINALYSIS DIP STICK MANUAL W/O MICRO    Collection Time     12/27/12  9:35 AM       Result Value Range    Color (UA POC) Yellow      Clarity (UA POC) Cloudy      Glucose (UA POC) Negative  Negative    Bilirubin (UA POC) Negative  Negative    Ketones (UA POC) Negative  Negative    Specific gravity (UA POC) 1.020  1.001 - 1.035    Blood (UA POC) 3+  Negative    pH (UA POC) 6.0  4.6 - 8.0    Protein (UA POC) 4+  Negative mg/dL    Urobilinogen (UA POC) 0.2 mg/dL  0.2 - 1    Nitrites (UA POC) Positive  Negative    Leukocyte esterase (UA POC) 3+  Negative       ASSESSMENT and PLAN    ICD-9-CM    1. UTI (lower urinary tract infection) 599.0 CULTURE, URINE     ciprofloxacin (CIPRO) 500 mg tablet   2. Dysuria 788.1 AMB POC URINALYSIS DIP STICK MANUAL W/O MICRO     phenazopyridine (PYRIDIUM) 200 mg tablet

## 2012-12-30 LAB — CULTURE, URINE

## 2012-12-30 NOTE — Telephone Encounter (Signed)
LMFCB to give cx results

## 2012-12-30 NOTE — Telephone Encounter (Signed)
Notified pt of results

## 2012-12-30 NOTE — Progress Notes (Signed)
Quick Note:    Call pt, the Cx is positive, the ABX should help  ______

## 2013-01-01 LAB — AMB POC URINALYSIS DIP STICK MANUAL W/O MICRO
Bilirubin (UA POC): NEGATIVE
Blood (UA POC): NEGATIVE
Ketones (UA POC): NEGATIVE
Leukocyte esterase (UA POC): NEGATIVE
Nitrites (UA POC): NEGATIVE
Specific gravity (UA POC): 1.03 (ref 1.001–1.035)
Urobilinogen (UA POC): 0.2 (ref 0.2–1)
pH (UA POC): 6 (ref 4.6–8.0)

## 2013-01-01 LAB — AMB POC COMPLETE CBC,AUTOMATED ENTER
ABS. GRANS (POC): 5.9 10*3/uL — AB (ref 2.2–4.8)
ABS. LYMPHS (POC): 2.5 10*3/uL (ref 1.3–2.9)
ABS. MONOS (POC): 0.5 10*3/uL (ref 0.3–0.8)
GRANULOCYTES (POC): 65.7 % — AB (ref 43–65)
HCT (POC): 40.5 % (ref 37–47)
HGB (POC): 13.4 g/dL (ref 12–16)
LYMPHOCYTES (POC): 28.3 % (ref 20.5–45.5)
MCH (POC): 30.4 pg (ref 28–32)
MCHC (POC): 33.1 g/dL (ref 32–37)
MCV (POC): 92 fL (ref 81–99)
MONOCYTES (POC): 6 % (ref 5.5–11.7)
MPV (POC): 7.3 fL (ref 7–10.4)
PLATELET (POC): 274 10*3/uL (ref 150–450)
RBC (POC): 4.4 10*6/uL (ref 4.2–5.4)
RDW (POC): 12.8 % (ref 11.5–15.5)
WBC (POC): 9 10*3/uL (ref 4.5–10.5)

## 2013-01-01 LAB — AMB POC GLUCOSE BLOOD, BY GLUCOSE MONITORING DEVICE: Glucose POC: 263 mg/dL

## 2013-01-01 NOTE — Progress Notes (Signed)
HISTORY OF PRESENT ILLNESS  Madison Peters is a 62 y.o. female.  Chief Complaint   Patient presents with   ??? Bladder Infection       HPI  Burning with urination  Was better after Cipro, but worse again 2 d ago, fever yesterday 101  Was on Cipro, Cx showed sensitivity  No blood in urine    Also cough worse  Trying to decrease smoking    Hx of DM  No BS checks  Taking Metformin qd only    Review of Systems   Constitutional: Positive for fever.   Respiratory: Positive for cough and sputum production.    Gastrointestinal: Positive for abdominal pain. Negative for nausea and vomiting.   Genitourinary: Positive for dysuria, urgency and frequency. Negative for hematuria.        No vag itching or D/C   Musculoskeletal: Positive for back pain.     Past Medical History   Diagnosis Date   ??? Emphysema    ??? Hypertension    ??? Anxiety    ??? Thyroid disease      hypothryoidism   ??? Diabetes    ??? Headache    ??? Dyspepsia    ??? IGT (impaired glucose tolerance)    ??? Lipoma      Left calf   ??? Kidney stones      Current Outpatient Prescriptions   Medication Sig Dispense Refill   ??? amoxicillin-clavulanate (AUGMENTIN) 875-125 mg per tablet Take 1 Tab by mouth two (2) times a day for 10 days.  20 Tab  0   ??? phenazopyridine (PYRIDIUM) 200 mg tablet Take 1 Tab by mouth three (3) times daily as needed for Pain for 3 days.  9 Tab  0   ??? levothyroxine (SYNTHROID) 50 mcg tablet TAKE 1 TABLET BY MOUTH EVERY DAY  30 Tab  10   ??? hydrochlorothiazide (HYDRODIURIL) 25 mg tablet TAKE 1 TABLET BY MOUTH EVERY DAY  30 Tab  10   ??? metoprolol succinate (TOPROL-XL) 50 mg XL tablet TAKE 1 TABLET BY MOUTH EVERY DAY  30 Tab  10   ??? lovastatin (MEVACOR) 40 mg tablet Take 40 mg by mouth nightly.       ??? albuterol (PROVENTIL HFA, VENTOLIN HFA) 90 mcg/actuation inhaler Take 1 Puff by inhalation every four (4) hours as needed for Wheezing.  1 Inhaler  0   ??? metFORMIN ER (GLUCOPHAGE XR) 500 mg tablet Take 1 Tab by mouth two (2) times a day.  60 Tab  11   ??? FLUoxetine  (PROZAC) 20 mg capsule Take 1 Cap by mouth two (2) times a day.  60 Cap  11   ??? amLODIPine (NORVASC) 10 mg tablet TAKE 1 TABLET BY MOUTH EVERY DAY  30 Tab  10     Allergies   Allergen Reactions   ??? Ace Inhibitors Cough   ??? Erythromycin Unknown (comments)   ??? Sulfa (Sulfonamide Antibiotics) Nausea and Vomiting     BP 120/60   Pulse 88   Temp(Src) 98.1 ??F (36.7 ??C) (Oral)   Resp 16   Ht 5' 6.5" (1.689 m)   Wt 184 lb (83.462 kg)   BMI 29.26 kg/m2    Physical Exam   Constitutional: She is oriented to person, place, and time. She appears well-developed and well-nourished. No distress.   HENT:   Head: Normocephalic and atraumatic.   Eyes: Conjunctivae and EOM are normal.   Cardiovascular: Normal rate, regular rhythm and normal heart sounds.  Pulmonary/Chest: Effort normal and breath sounds normal. No respiratory distress. She has no wheezes. She has no rales.   Abdominal: Soft. There is tenderness (suprapubic).   Musculoskeletal: She exhibits no edema.   Neurological: She is alert and oriented to person, place, and time.   Skin: Skin is warm and dry.     Recent Results (from the past 12 hour(s))   AMB POC URINALYSIS DIP STICK MANUAL W/O MICRO    Collection Time     01/01/13  1:50 PM       Result Value Range    Color (UA POC) Yellow      Clarity (UA POC) Cloudy      Glucose (UA POC) 4+  Negative    Bilirubin (UA POC) Negative  Negative    Ketones (UA POC) Negative  Negative    Specific gravity (UA POC) 1.030  1.001 - 1.035    Blood (UA POC) Negative  Negative    pH (UA POC) 6.0  4.6 - 8.0    Protein (UA POC) Trace  Negative mg/dL    Urobilinogen (UA POC) 0.2 mg/dL  0.2 - 1    Nitrites (UA POC) Negative  Negative    Leukocyte esterase (UA POC) Negative  Negative   AMB POC GLUCOSE BLOOD, BY GLUCOSE MONITORING DEVICE    Collection Time     01/01/13  2:14 PM       Result Value Range    Glucose POC 263     AMB POC COMPLETE CBC,AUTOMATED ENTER    Collection Time     01/01/13  2:28 PM       Result Value Range    WBC (POC) 9.0   4.5 - 10.5 10^3/ul    RBC (POC) 4.40  4.2 - 5.4 10^6/ul    HGB (POC) 13.4  12 - 16 g/dL    HCT (POC) 70.6  37 - 47 %    MCV (POC) 92.0  81 - 99 fL    MCH (POC) 30.4  28 - 32 pg    MCHC (POC) 33.1  32 - 37 g/dL    RDW (POC) 23.7  62.8 - 15.5 %    PLATELET (POC) 274  150 - 450 10^3/ul    MPV (POC) 7.3  7 - 10.4 fL    LYMPHOCYTES (POC) 28.3  20.5 - 45.5 %    ABS. LYMPHS (POC) 2.5  1.3 - 2.9 10^3/ul    MONOCYTES (POC) 6.0  5.5 - 11.7 %    ABS. MONOS (POC) 0.5  0.3 - 0.8 10^3/ul    GRANULOCYTES (POC) 65.7 (*) 43 - 65 %    ABS. GRANS (POC) 5.9 (*) 2.2 - 4.8 10^3/ul       ASSESSMENT and PLAN    ICD-9-CM    1. UTI (lower urinary tract infection) 599.0 AMB POC URINALYSIS DIP STICK MANUAL W/O MICRO     URINALYSIS W/MICROSCOPIC     CULTURE, URINE     amoxicillin-clavulanate (AUGMENTIN) 875-125 mg per tablet     phenazopyridine (PYRIDIUM) 200 mg tablet     AMB POC COMPLETE CBC,AUTOMATED ENTER   2. Diabetes 250.00 AMB POC GLUCOSE BLOOD, BY GLUCOSE MONITORING DEVICE  Monitor sugar, Glucometer and Strips Rx given and pt taught, increase Glucophage to bid and F/U in 2 w     HEMOGLOBIN A1C   3. Hyperlipidemia 272.4 METABOLIC PANEL, COMPREHENSIVE     LIPID PANEL WITH LDL/HDL RATIO fasting soon   4. Bronchitis 490 amoxicillin-clavulanate (AUGMENTIN)  875-125 mg per tablet   F/U in 2 w

## 2013-01-02 LAB — METABOLIC PANEL, COMPREHENSIVE
A-G Ratio: 2.4 (ref 1.1–2.5)
ALT (SGPT): 28 IU/L (ref 0–32)
AST (SGOT): 18 IU/L (ref 0–40)
Albumin: 4.5 g/dL (ref 3.6–4.8)
Alk. phosphatase: 65 IU/L (ref 39–117)
BUN/Creatinine ratio: 30 — ABNORMAL HIGH (ref 11–26)
BUN: 17 mg/dL (ref 8–27)
Bilirubin, total: 0.2 mg/dL (ref 0.0–1.2)
CO2: 22 mmol/L (ref 19–28)
Calcium: 9.4 mg/dL (ref 8.6–10.2)
Chloride: 101 mmol/L (ref 97–108)
Creatinine: 0.57 mg/dL (ref 0.57–1.00)
GFR est AA: 117 mL/min/{1.73_m2} (ref >59)
GFR est non-AA: 101 mL/min/{1.73_m2} (ref >59)
GLOBULIN, TOTAL: 1.9 g/dL (ref 1.5–4.5)
Glucose: 236 mg/dL — ABNORMAL HIGH (ref 65–99)
Potassium: 3.7 mmol/L (ref 3.5–5.2)
Protein, total: 6.4 g/dL (ref 6.0–8.5)
Sodium: 139 mmol/L (ref 134–144)

## 2013-01-02 LAB — HEMOGLOBIN A1C WITH EAG: Hemoglobin A1c: 8.3 % — ABNORMAL HIGH (ref 4.8–5.6)

## 2013-01-03 LAB — URINALYSIS W/MICROSCOPIC
Bilirubin: NEGATIVE
Blood: NEGATIVE
Ketone: NEGATIVE
Leukocyte Esterase: NEGATIVE
Nitrites: NEGATIVE
Protein: NEGATIVE
Specific Gravity: >=1.03 — AB (ref 1.005–1.030)
Urobilinogen: 0.2 mg/dL (ref 0.0–1.9)
pH (UA): 5.5 (ref 5.0–7.5)

## 2013-01-03 LAB — MICROSCOPIC EXAMINATION
Epithelial cells: NONE SEEN /hpf (ref 0–10)
RBC: NONE SEEN /hpf (ref 0–?)
WBC: NONE SEEN /hpf (ref 0–?)

## 2013-01-03 LAB — CULTURE, URINE
Urine Culture, Routine: NO GROWTH
Urine Culture, Routine: NO GROWTH

## 2013-01-03 NOTE — Progress Notes (Signed)
Quick Note:    Call pt, the thyroid test does shoe an overactive thyroid, recheck in 2 mo  The chol, sugar, kidney and liver tests are normal  The Calcium is elevated, RTC and recheck Ca and PTH please  ______

## 2013-01-03 NOTE — Telephone Encounter (Signed)
Call pt, the HBA1C is 8.3, which is too high, increase the med as discussed, monitor sugar and F/U in 2 w  The liver, kidney tests and electrolytes are normal

## 2013-01-03 NOTE — Telephone Encounter (Signed)
Message copied by Villa Herb on Sat Jan 03, 2013  3:21 PM  ------       Message from: Philemon Kingdom       Created: Sat Jan 03, 2013  8:59 AM         Call pt, the thyroid test does shoe an overactive thyroid, recheck in 2 mo       The chol, sugar, kidney and liver tests are normal       The Calcium is elevated, RTC and recheck Ca and PTH please  ------

## 2013-01-03 NOTE — Telephone Encounter (Signed)
Patient advised

## 2013-11-10 LAB — AMB POC URINALYSIS DIP STICK MANUAL W/O MICRO
Bilirubin (UA POC): NEGATIVE
Blood (UA POC): NEGATIVE
Glucose (UA POC): NEGATIVE
Ketones (UA POC): NEGATIVE
Leukocyte esterase (UA POC): NEGATIVE
Nitrites (UA POC): NEGATIVE
Protein (UA POC): NEGATIVE mg/dL
Specific gravity (UA POC): 1.02 (ref 1.001–1.035)
Urobilinogen (UA POC): 0.2 (ref 0.2–1)
pH (UA POC): 6 (ref 4.6–8.0)

## 2013-11-10 MED ORDER — FLUOXETINE 20 MG CAP
20 mg | ORAL_CAPSULE | Freq: Two times a day (BID) | ORAL | Status: DC
Start: 2013-11-10 — End: 2014-12-21

## 2013-11-10 MED ORDER — METOPROLOL SUCCINATE SR 50 MG 24 HR TAB
50 mg | ORAL_TABLET | ORAL | Status: DC
Start: 2013-11-10 — End: 2014-12-21

## 2013-11-10 MED ORDER — ATORVASTATIN 40 MG TAB
40 mg | ORAL_TABLET | Freq: Every day | ORAL | Status: DC
Start: 2013-11-10 — End: 2014-12-21

## 2013-11-10 MED ORDER — AMLODIPINE 10 MG TAB
10 mg | ORAL_TABLET | ORAL | Status: DC
Start: 2013-11-10 — End: 2014-12-21

## 2013-11-10 MED ORDER — LEVOTHYROXINE 50 MCG TAB
50 mcg | ORAL_TABLET | ORAL | Status: DC
Start: 2013-11-10 — End: 2014-12-21

## 2013-11-10 MED ORDER — METFORMIN SR 500 MG 24 HR TABLET
500 mg | ORAL_TABLET | Freq: Two times a day (BID) | ORAL | Status: DC
Start: 2013-11-10 — End: 2014-12-21

## 2013-11-10 MED ORDER — HYDROCHLOROTHIAZIDE 25 MG TAB
25 mg | ORAL_TABLET | ORAL | Status: DC
Start: 2013-11-10 — End: 2014-12-21

## 2013-11-10 NOTE — Progress Notes (Signed)
HISTORY OF PRESENT ILLNESS  Madison Peters is a 63 y.o. female.  Medication Refill     Known DM, HTN, hyperlipidemia and continued smoker in for med refills. Denies HA, CP, SOB or TIA S&S's.Denies hyerglycemic or hypoglycemic S&S's. Not doing HBS's.No GI or GU S&S's.    ROS See HPI    Physical Exam   Constitutional:   Mildly obese WF NAD.   HENT:   Head: Normocephalic and atraumatic.   Right Ear: External ear normal.   Left Ear: External ear normal.   Nose: Nose normal.   Mouth/Throat: Oropharynx is clear and moist.   Eyes: EOM are normal. Pupils are equal, round, and reactive to light.   Neck: Normal range of motion. Neck supple. No thyromegaly present.   Cardiovascular: Normal rate, regular rhythm, normal heart sounds and intact distal pulses.    No murmur heard.  Pulmonary/Chest: Effort normal and breath sounds normal.   Musculoskeletal: She exhibits no edema.   Lymphadenopathy:     She has no cervical adenopathy.   Nursing note and vitals reviewed.      ASSESSMENT and PLAN  1. Smoker  2. DM  3. HTN  4. Obesity  5. Hyperlipidemia  Plan:  1. STOP SMOKING!  2. Labs pending  3. Meds refilled  4. Diet and weight loss discussed.  5. F/U 3 months, earlier prn.

## 2013-11-12 LAB — CBC WITH AUTOMATED DIFF
ABS. BASOPHILS: 0.2 10*3/uL (ref 0.0–0.2)
ABS. EOSINOPHILS: 0.4 10*3/uL (ref 0.0–0.4)
ABS. IMM. GRANS.: 0 10*3/uL (ref 0.0–0.1)
ABS. MONOCYTES: 0.5 10*3/uL (ref 0.1–0.9)
ABS. NEUTROPHILS: 4.5 10*3/uL (ref 1.4–7.0)
Abs Lymphocytes: 2.8 10*3/uL (ref 0.7–3.1)
BASOPHILS: 2 %
EOSINOPHILS: 5 %
HCT: 39.4 % (ref 34.0–46.6)
HGB: 13.8 g/dL (ref 11.1–15.9)
IMMATURE GRANULOCYTES: 0 %
Lymphocytes: 34 %
MCH: 32.5 pg (ref 26.6–33.0)
MCHC: 35 g/dL (ref 31.5–35.7)
MCV: 93 fL (ref 79–97)
MONOCYTES: 6 %
NEUTROPHILS: 53 %
PLATELET: 286 10*3/uL (ref 150–379)
RBC: 4.25 x10E6/uL (ref 3.77–5.28)
RDW: 13.5 % (ref 12.3–15.4)
WBC: 8.4 10*3/uL (ref 3.4–10.8)

## 2013-11-12 LAB — METABOLIC PANEL, COMPREHENSIVE

## 2013-11-12 LAB — TSH 3RD GENERATION: TSH: 3.79 u[IU]/mL (ref 0.450–4.500)

## 2013-11-12 LAB — HEMOGLOBIN A1C WITH EAG: Hemoglobin A1c: 8.1 % — ABNORMAL HIGH (ref 4.8–5.6)

## 2013-11-12 LAB — MICROALBUMIN, UR, RAND W/ MICROALB/CREAT RATIO: Microalbumin, urine: 13.7 ug/mL (ref 0.0–17.0)

## 2013-11-18 NOTE — Progress Notes (Signed)
Quick Note:    Get CMP. Increase metformin to 1,000 mg QAM and 500 mg QPM. Repeat A1-C 3 months.  ______

## 2013-11-19 NOTE — Progress Notes (Signed)
Patient advised

## 2013-11-19 NOTE — Progress Notes (Signed)
LMFCB

## 2014-04-15 MED ORDER — PHENAZOPYRIDINE 200 MG TAB
200 mg | ORAL_TABLET | Freq: Three times a day (TID) | ORAL | Status: AC | PRN
Start: 2014-04-15 — End: 2014-04-18

## 2014-04-15 MED ORDER — NITROFURANTOIN (25% MACROCRYSTAL FORM) 100 MG CAP
100 mg | ORAL_CAPSULE | Freq: Two times a day (BID) | ORAL | Status: AC
Start: 2014-04-15 — End: 2014-04-22

## 2014-04-15 NOTE — Progress Notes (Signed)
HISTORY OF PRESENT ILLNESS  Madison Peters is a 63 y.o. female.  HPI C/O urinary frequency and dysuria x 5 days. Afebrile. No back or abdm pain.     ROS See HPI    Physical Exam   Constitutional:   OW WF NAD.   HENT:   Head: Normocephalic and atraumatic.   Mouth/Throat: Oropharynx is clear and moist.   Eyes: Pupils are equal, round, and reactive to light.   Neck: Normal range of motion. Neck supple. No thyromegaly present.   Cardiovascular: Normal rate, regular rhythm and normal heart sounds.    Pulmonary/Chest: Effort normal and breath sounds normal.   Abdominal: Soft. Bowel sounds are normal. She exhibits no mass. There is no tenderness. There is no rebound and no guarding.   Musculoskeletal:   Back - no CVA tenderness.   Lymphadenopathy:     She has no cervical adenopathy.   Skin: No rash noted.   Nursing note and vitals reviewed.      ASSESSMENT and PLAN  1. UTI  Plan:  1. UA, UA C&S pending  2. Macrobid 100 mg bid x 7 days  3. Pyridium 200mg  po tid x 3 days  4. Encourage po fluids  5. F/U prn.

## 2014-04-17 LAB — CULTURE, URINE

## 2014-12-21 ENCOUNTER — Ambulatory Visit: Admit: 2014-12-21 | Payer: PRIVATE HEALTH INSURANCE | Attending: Physician Assistant | Primary: Physician Assistant

## 2014-12-21 DIAGNOSIS — I10 Essential (primary) hypertension: Secondary | ICD-10-CM

## 2014-12-21 MED ORDER — METOPROLOL SUCCINATE SR 50 MG 24 HR TAB
50 mg | ORAL_TABLET | ORAL | Status: DC
Start: 2014-12-21 — End: 2014-12-22

## 2014-12-21 MED ORDER — LEVOTHYROXINE 50 MCG TAB
50 mcg | ORAL_TABLET | ORAL | Status: DC
Start: 2014-12-21 — End: 2014-12-22

## 2014-12-21 MED ORDER — AMLODIPINE 10 MG TAB
10 mg | ORAL_TABLET | ORAL | Status: DC
Start: 2014-12-21 — End: 2014-12-22

## 2014-12-21 MED ORDER — ATORVASTATIN 40 MG TAB
40 mg | ORAL_TABLET | Freq: Every day | ORAL | Status: DC
Start: 2014-12-21 — End: 2014-12-22

## 2014-12-21 MED ORDER — METFORMIN SR 500 MG 24 HR TABLET
500 mg | ORAL_TABLET | Freq: Two times a day (BID) | ORAL | Status: DC
Start: 2014-12-21 — End: 2014-12-22

## 2014-12-21 MED ORDER — HYDROCHLOROTHIAZIDE 25 MG TAB
25 mg | ORAL_TABLET | ORAL | Status: DC
Start: 2014-12-21 — End: 2014-12-22

## 2014-12-21 MED ORDER — FLUOXETINE 20 MG CAP
20 mg | ORAL_CAPSULE | Freq: Two times a day (BID) | ORAL | Status: DC
Start: 2014-12-21 — End: 2014-12-22

## 2014-12-21 NOTE — Progress Notes (Signed)
HISTORY OF PRESENT ILLNESS  Madison Peters is a 64 y.o. female.  HPI Known DM, HTN and dyslipidemia in for med refills. Doing well w/o acute concerns. Does not monitor HBS's. Denies hyperglycemic or hypoglycemic S&S's. No GI or GU S&S's. Denies HA, CP, SOB or TIA S&S's.    ROS See HPI    Physical Exam   Constitutional:   OW WF NAD.   HENT:   Head: Normocephalic and atraumatic.   Right Ear: External ear normal.   Left Ear: External ear normal.   Nose: Nose normal.   Mouth/Throat: Oropharynx is clear and moist.   Eyes: Pupils are equal, round, and reactive to light.   Neck: Normal range of motion. Neck supple. No thyromegaly present.   Cardiovascular: Normal rate, regular rhythm, normal heart sounds and intact distal pulses.    Pulmonary/Chest: Effort normal and breath sounds normal.   Musculoskeletal: She exhibits no edema.   Lymphadenopathy:     She has no cervical adenopathy.   Skin: No rash noted.   Nursing note and vitals reviewed.      ASSESSMENT and PLAN  1. DM  2. HTN  3. Dyslipidemia  4. Thyroid Dz  Plan:  1. Labs pending  2. Meds refilled  3. Diet and weight loss discussed.  4. Compliance discussed  5. F/U 3 months, earlier prn.

## 2014-12-21 NOTE — Addendum Note (Signed)
Addended by: Glendale ChardSCOTT, DARLENE on: 12/21/2014 04:50 PM      Modules accepted: Orders

## 2014-12-22 ENCOUNTER — Encounter

## 2014-12-22 LAB — METABOLIC PANEL, COMPREHENSIVE
A-G Ratio: 2.4 (ref 1.1–2.5)
ALT (SGPT): 17 IU/L (ref 0–32)
AST (SGOT): 13 IU/L (ref 0–40)
Albumin: 4.8 g/dL (ref 3.6–4.8)
Alk. phosphatase: 77 IU/L (ref 39–117)
BUN/Creatinine ratio: 33 — ABNORMAL HIGH (ref 11–26)
BUN: 18 mg/dL (ref 8–27)
Bilirubin, total: 0.3 mg/dL (ref 0.0–1.2)
CO2: 20 mmol/L (ref 18–29)
Calcium: 9.7 mg/dL (ref 8.7–10.3)
Chloride: 97 mmol/L (ref 97–108)
Creatinine: 0.55 mg/dL — ABNORMAL LOW (ref 0.57–1.00)
GFR est AA: 116 mL/min/{1.73_m2} (ref >59)
GFR est non-AA: 101 mL/min/{1.73_m2} (ref >59)
GLOBULIN, TOTAL: 2 g/dL (ref 1.5–4.5)
Glucose: 157 mg/dL — ABNORMAL HIGH (ref 65–99)
Potassium: 3.8 mmol/L (ref 3.5–5.2)
Protein, total: 6.8 g/dL (ref 6.0–8.5)
Sodium: 137 mmol/L (ref 134–144)

## 2014-12-22 LAB — CBC WITH AUTOMATED DIFF
ABS. BASOPHILS: 0.1 10*3/uL (ref 0.0–0.2)
ABS. EOSINOPHILS: 0.2 10*3/uL (ref 0.0–0.4)
ABS. IMM. GRANS.: 0 10*3/uL (ref 0.0–0.1)
ABS. MONOCYTES: 0.8 10*3/uL (ref 0.1–0.9)
ABS. NEUTROPHILS: 5.8 10*3/uL (ref 1.4–7.0)
Abs Lymphocytes: 2.3 10*3/uL (ref 0.7–3.1)
BASOPHILS: 1 %
EOSINOPHILS: 3 %
HCT: 39.4 % (ref 34.0–46.6)
HGB: 13.5 g/dL (ref 11.1–15.9)
IMMATURE GRANULOCYTES: 0 %
Lymphocytes: 25 %
MCH: 30.5 pg (ref 26.6–33.0)
MCHC: 34.3 g/dL (ref 31.5–35.7)
MCV: 89 fL (ref 79–97)
MONOCYTES: 8 %
NEUTROPHILS: 63 %
PLATELET: 268 10*3/uL (ref 150–379)
RBC: 4.43 x10E6/uL (ref 3.77–5.28)
RDW: 13.7 % (ref 12.3–15.4)
WBC: 9.2 10*3/uL (ref 3.4–10.8)

## 2014-12-22 LAB — HEMOGLOBIN A1C WITH EAG
Estimated average glucose: 197 mg/dL
Hemoglobin A1c: 8.5 % — ABNORMAL HIGH (ref 4.8–5.6)

## 2014-12-22 LAB — MICROALBUMIN, UR, RAND W/ MICROALB/CREAT RATIO: Microalbumin, urine: 5.5 ug/mL (ref 0.0–17.0)

## 2014-12-22 LAB — TSH 3RD GENERATION: TSH: 2.18 u[IU]/mL (ref 0.450–4.500)

## 2014-12-22 MED ORDER — AMLODIPINE 10 MG TAB
10 mg | ORAL_TABLET | ORAL | Status: DC
Start: 2014-12-22 — End: 2015-11-01

## 2014-12-22 MED ORDER — LEVOTHYROXINE 50 MCG TAB
50 mcg | ORAL_TABLET | ORAL | Status: DC
Start: 2014-12-22 — End: 2015-11-01

## 2014-12-22 MED ORDER — METFORMIN SR 500 MG 24 HR TABLET
500 mg | ORAL_TABLET | Freq: Two times a day (BID) | ORAL | Status: DC
Start: 2014-12-22 — End: 2015-04-08

## 2014-12-22 MED ORDER — METOPROLOL SUCCINATE SR 50 MG 24 HR TAB
50 mg | ORAL_TABLET | ORAL | Status: DC
Start: 2014-12-22 — End: 2015-11-01

## 2014-12-22 MED ORDER — FLUOXETINE 20 MG CAP
20 mg | ORAL_CAPSULE | Freq: Two times a day (BID) | ORAL | Status: DC
Start: 2014-12-22 — End: 2015-11-01

## 2014-12-22 MED ORDER — HYDROCHLOROTHIAZIDE 25 MG TAB
25 mg | ORAL_TABLET | ORAL | Status: DC
Start: 2014-12-22 — End: 2015-11-01

## 2014-12-22 MED ORDER — ATORVASTATIN 40 MG TAB
40 mg | ORAL_TABLET | Freq: Every day | ORAL | Status: DC
Start: 2014-12-22 — End: 2015-11-01

## 2014-12-22 NOTE — Telephone Encounter (Signed)
Patient called saying all seven of her medications refilled on April 19th, 2016 should [facility].  Can you please re[facility] to [facility]  Thank you!

## 2014-12-22 NOTE — Telephone Encounter (Signed)
Prescriptions have [facility]

## 2014-12-23 NOTE — Progress Notes (Signed)
Quick Note:        Advise results. Increase metformin to 1,000 mg QAM and 500 mg qPM. Repeat labs 3 months.    ______

## 2014-12-24 NOTE — Progress Notes (Signed)
Quick Note:        LMFCB to give lab results.    ______

## 2015-04-05 ENCOUNTER — Ambulatory Visit
Admit: 2015-04-05 | Discharge: 2015-04-05 | Payer: PRIVATE HEALTH INSURANCE | Attending: Physician Assistant | Primary: Physician Assistant

## 2015-04-05 DIAGNOSIS — R309 Painful micturition, unspecified: Secondary | ICD-10-CM

## 2015-04-05 LAB — AMB POC URINALYSIS DIP STICK MANUAL W/O MICRO
Ketones (UA POC): NEGATIVE
Nitrites (UA POC): POSITIVE
Specific gravity (UA POC): 1.025 (ref 1.001–1.035)
Urobilinogen (UA POC): 1 (ref 0.2–1)
pH (UA POC): 5.5 (ref 4.6–8.0)

## 2015-04-05 MED ORDER — PHENAZOPYRIDINE 200 MG TAB
200 mg | ORAL_TABLET | Freq: Three times a day (TID) | ORAL | Status: AC | PRN
Start: 2015-04-05 — End: 2015-04-08

## 2015-04-05 MED ORDER — NITROFURANTOIN (25% MACROCRYSTAL FORM) 100 MG CAP
100 mg | ORAL_CAPSULE | Freq: Two times a day (BID) | ORAL | Status: AC
Start: 2015-04-05 — End: 2015-04-12

## 2015-04-05 NOTE — Patient Instructions (Signed)
Urinary Tract Infection in Women: Care Instructions  Your Care Instructions     A urinary tract infection, or UTI, is a general term for an infection anywhere between the kidneys and the urethra (where urine comes out). Most UTIs are bladder infections. They often cause pain or burning when you urinate.  UTIs are caused by bacteria and can be cured with antibiotics. Be sure to complete your treatment so that the infection goes away.  Follow-up care is a key part of your treatment and safety. Be sure to make and go to all appointments, and call your doctor if you are having problems. It's also a good idea to know your test results and keep a list of the medicines you take.  How can you care for yourself at home?  ?? Take your antibiotics as directed. Do not stop taking them just because you feel better. You need to take the full course of antibiotics.  ?? Drink extra water and other fluids for the next day or two. This may help wash out the bacteria that are causing the infection. (If you have kidney, heart, or liver disease and have to limit fluids, talk with your doctor before you increase your fluid intake.)  ?? Avoid drinks that are carbonated or have caffeine. They can irritate the bladder.  ?? Urinate often. Try to empty your bladder each time.  ?? To relieve pain, take a hot bath or lay a heating pad set on low over your lower belly or genital area. Never go to sleep with a heating pad in place.  To prevent UTIs  ?? Drink plenty of water each day. This helps you urinate often, which clears bacteria from your system. (If you have kidney, heart, or liver disease and have to limit fluids, talk with your doctor before you increase your fluid intake.)  ?? Consider adding cranberry juice to your diet.  ?? Urinate when you need to.  ?? Urinate right after you have sex.  ?? Change sanitary pads often.  ?? Avoid douches, bubble baths, feminine hygiene sprays, and other feminine hygiene products that have deodorants.   ?? After going to the bathroom, wipe from front to back.  When should you call for help?  Call your doctor now or seek immediate medical care if:  ?? Symptoms such as fever, chills, nausea, or vomiting get worse or appear for the first time.  ?? You have new pain in your back just below your rib cage. This is called flank pain.  ?? There is new blood or pus in your urine.  ?? You have any problems with your antibiotic medicine.  Watch closely for changes in your health, and be sure to contact your doctor if:  ?? You are not getting better after taking an antibiotic for 2 days.  ?? Your symptoms go away but then come back.  Where can you learn more?  Go to http://www.healthwise.net/GoodHelpConnections  Enter K848 in the search box to learn more about "Urinary Tract Infection in Women: Care Instructions."  ?? 2006-2016 [employer]. Care instructions adapted under license by [employer] (which disclaims liability or warranty for this information). This care instruction is for use with your licensed healthcare professional. If you have questions about a medical condition or this instruction, always ask your healthcare professional. Healthwise, Incorporated disclaims any warranty or liability for your use of this information.  Content Version: 10.9.538570; Current as of: July 23, 2014        Learning   About Benefits From Quitting Smoking  How does quitting smoking make you healthier?  If you're thinking about quitting smoking, you may have a few reasons to be smoke-free. Your health may be one of them.  ?? When you quit smoking, you lower your risks for cancer, lung disease, heart attack, stroke, blood vessel disease, and blindness from macular degeneration.  ?? When you're smoke-free, you get sick less often, and you heal faster. You are less likely to get colds, flu, bronchitis, and pneumonia.  ?? As a nonsmoker, you may find that your mood is better and you are less stressed.   When and how will you feel healthier?  Quitting has real health benefits that start from day 1 of being smoke-free. And the longer you stay smoke-free, the healthier you get and the better you feel.  The first hours  ?? After just 20 minutes, your blood pressure and heart rate go down. That means there's less stress on your heart and blood vessels.  ?? Within 12 hours, the level of carbon monoxide in your blood drops back to normal. That makes room for more oxygen. With more oxygen in your body, you may notice that you have more energy than when you smoked.  After 2 weeks  ?? Your lungs start to work better.  ?? Your risk of heart attack starts to drop.  After 1 month  ?? When your lungs are clear, you cough less and breathe deeper, so it's easier to be active.  ?? Your sense of taste and smell return. That means you can enjoy food more than you have since you started smoking.  Over the years  ?? After 1 year, your risk of heart disease is half what it would be if you kept smoking.  ?? After 5 years, your risk of stroke starts to shrink. Within a few years after that, it's about the same as if you'd never smoked.  ?? After 10 years, your risk of dying from lung cancer is cut by about half. And your risk for many other types of cancer is lower too.  How would quitting help others in your life?  When you quit smoking, you improve the health of everyone who now breathes in your smoke.  ?? Their heart, lung, and cancer risks drop, much like yours.  ?? They are sick less. For babies and small children, living smoke-free means they're less likely to have ear infections, pneumonia, and bronchitis.  ?? If you're a woman who is or will be pregnant someday, quitting smoking means a healthier newborn.  ?? Children who are close to you are less likely to become adult smokers.  Where can you learn more?  Go to http://www.healthwise.net/GoodHelpConnections  Enter O319 in the search box to learn more about "Learning About Benefits  From Quitting Smoking."  ?? 2006-2016 [employer]. Care instructions adapted under license by [employer] (which disclaims liability or warranty for this information). This care instruction is for use with your licensed healthcare professional. If you have questions about a medical condition or this instruction, always ask your healthcare professional. [employer] disclaims any warranty or liability for your use of this information.  Content Version: 10.9.538570; Current as of: April 23, 2014

## 2015-04-05 NOTE — Progress Notes (Signed)
Madison Peters is a 64 y.o. female who presents to the office today with the following:  Chief Complaint   Patient presents with   ??? Bladder Infection     s/s since Sunday, taking AZO       HPI  Pt presents with urinary pain/burning, urgency, and frequency x 2 days.  Has been taking AZO and cranberry pills otc for sxs which has helped her sxs, but would like "something stronger." Had Pyridium last year that worked well.  Last UTI last year.   Afebrile, no fever/chills, back pain, abd pain, current n/v/d.  Has to get cranberry pills bc the juice makes her vomit.  Had few episodes of diarrhea on Sunday, but resolved.    Also thinks ears feels stopped up.   Wants to have looked at.  May make apt to return to have them cleaned out.    Review of Systems   Constitutional: Negative for fever, chills and malaise/fatigue.   HENT: Negative for sore throat.    Respiratory: Positive for cough (not new, says bc she is a smoker and is having mucus drainage from sinus/allergies). Negative for shortness of breath.    Cardiovascular: Negative for chest pain.   Gastrointestinal: Positive for vomiting (once after cranberry juice 2 d ago) and diarrhea (twice sunday, resovled). Negative for nausea and abdominal pain.   Genitourinary: Positive for dysuria, urgency and frequency. Negative for hematuria and flank pain.   Musculoskeletal: Negative for myalgias.   Skin: Negative for rash.   Neurological: Negative for headaches.       Allergies   Allergen Reactions   ??? Ace Inhibitors Cough   ??? Erythromycin Unknown (comments)   ??? Sulfa (Sulfonamide Antibiotics) Nausea and Vomiting       Current Outpatient Prescriptions   Medication Sig   ??? amLODIPine (NORVASC) 10 mg tablet TAKE 1 TABLET BY MOUTH EVERY DAY   ??? levothyroxine (SYNTHROID) 50 mcg tablet TAKE 1 TABLET BY MOUTH EVERY DAY   ??? hydrochlorothiazide (HYDRODIURIL) 25 mg tablet TAKE 1 TABLET BY MOUTH EVERY DAY   ??? metoprolol succinate (TOPROL-XL) 50 mg XL tablet TAKE 1 TABLET BY MOUTH  EVERY DAY   ??? metFORMIN ER (GLUCOPHAGE XR) 500 mg tablet Take 1 Tab by mouth two (2) times a day.   ??? FLUoxetine (PROZAC) 20 mg capsule Take 1 Cap by mouth two (2) times a day.   ??? atorvastatin (LIPITOR) 40 mg tablet Take 1 Tab by mouth daily.   ??? vitamin E (AQUA GEMS) 400 unit capsule Take  by mouth daily.   ??? vit C-rutin-hesper-bioflv-rose 1,000-50-50 mg TbER Take  by mouth.   ??? acetaminophen (TYLENOL) 500 mg tablet Take  by mouth every six (6) hours as needed for Pain.   ??? SODIUM CHLORIDE (SALINE NASAL NA) by Nasal route. 2 sprays each nostril every 12 hours.   ??? aspirin (ASPIRIN) 325 mg tablet Take 325 mg by mouth daily.   ??? multivitamin with iron (DAILY MULTI-VITAMINS/IRON) tablet Take 1 Tab by mouth daily.   ??? Cholecalciferol, Vitamin D3, (VITAMIN D3) 1,000 unit cap Take  by mouth.   ??? cyanocobalamin 1,000 mcg tablet Take 1,000 mcg by mouth two (2) times a day.   ??? omega-3 fatty acids-vitamin e (FISH OIL) 1,000 mg cap Take 1 Cap by mouth four (4) times daily.   ??? cetirizine (ZYRTEC) 10 mg tablet Take  by mouth.     No current facility-administered medications for this visit.  Past Medical History   Diagnosis Date   ??? Emphysema    ??? Hypertension    ??? Anxiety    ??? Thyroid disease      hypothryoidism   ??? Diabetes (HCC)    ??? Headache(784.0)    ??? Dyspepsia    ??? IGT (impaired glucose tolerance)    ??? Lipoma      Left calf   ??? Kidney stones        Past Surgical History   Procedure Laterality Date   ??? Hx heent       Tonsillectomy at age 39       History     Social History   ??? Marital Status: MARRIED     Spouse Name: N/A   ??? Number of Children: N/A   ??? Years of Education: N/A     Social History Main Topics   ??? Smoking status: Current Every Day Smoker   ??? Smokeless tobacco: Never Used   ??? Alcohol Use: No   ??? Drug Use: No   ??? Sexual Activity:     Partners: Male     Other Topics Concern   ??? None     Social History Narrative       No family history on file.      Physical Exam:   BP 136/77 mmHg   Pulse 90   Temp(Src) 98.4 ??F (36.9 ??C) (Oral)   Resp 16   Ht 5\' 6"  (1.676 m)   Wt 173 lb (78.472 kg)   BMI 27.94 kg/m2   SpO2 92%  Physical Exam   Constitutional: She is oriented to person, place, and time and well-developed, well-nourished, and in no distress.   HENT:   Head: Normocephalic and atraumatic.   Right Ear: External ear normal.   Left Ear: External ear normal.   Mouth/Throat: Uvula is midline, oropharynx is clear and moist and mucous membranes are normal.   Bilateral cerumen impaction obstructing views of TMs. Otherwise ears are wnl, nonttp.   Eyes: Conjunctivae and EOM are normal.   Neck: Normal range of motion. Neck supple.   Cardiovascular: Normal rate, regular rhythm and normal heart sounds.    Pulmonary/Chest: Effort normal and breath sounds normal. No respiratory distress.   Abdominal: Soft. Bowel sounds are normal. There is no hepatosplenomegaly. There is no tenderness. There is no rigidity, no guarding, no CVA tenderness, no tenderness at McBurney's point and negative Murphy's sign.   Lymphadenopathy:     She has no cervical adenopathy.   Neurological: She is alert and oriented to person, place, and time. Gait normal.   Skin: Skin is warm and dry.   Psychiatric: Mood and affect normal.       Results for orders placed or performed in visit on 04/05/15   AMB POC URINALYSIS DIP STICK MANUAL W/O MICRO   Result Value Ref Range    Color (UA POC) Orange     Clarity (UA POC) Slightly Cloudy     Glucose (UA POC) Trace Negative    Bilirubin (UA POC) 1+ Negative    Ketones (UA POC) Negative Negative    Specific gravity (UA POC) 1.025 1.001 - 1.035    Blood (UA POC) 2+ Negative    pH (UA POC) 5.5 4.6 - 8.0    Protein (UA POC) 2+ Negative mg/dL    Urobilinogen (UA POC) 1 mg/dL 0.2 - 1    Nitrites (UA POC) Positive Negative    Leukocyte esterase (UA POC) 3+ Negative  Assessment/Plan:    ICD-10-CM ICD-9-CM    1. Urinary pain R30.9 788.1 AMB POC URINALYSIS DIP STICK MANUAL W/O MICRO       nitrofurantoin, macrocrystal-monohydrate, (MACROBID) 100 mg capsule      phenazopyridine (PYRIDIUM) 200 mg tablet   2. Acute cystitis with hematuria N30.01 595.0 AMB POC URINALYSIS DIP STICK MANUAL W/O MICRO      nitrofurantoin, macrocrystal-monohydrate, (MACROBID) 100 mg capsule      phenazopyridine (PYRIDIUM) 200 mg tablet      CULTURE, URINE   3. Urinary frequency R35.0 788.41        Counseled pt on potential medication AEs/interactions.  Push fluids. Don't hold urine. Urinate after intercourse.   Avoid caffeine/carbonated beverages.     Pt will make another apt for ears/HM.    Follow-up Disposition:  Return if symptoms worsen or fail to improve.    Marlan Palau, PA-C

## 2015-04-07 LAB — CULTURE, URINE
Urine Culture, Routine: NO GROWTH
Urine Culture, Routine: NO GROWTH

## 2015-04-07 NOTE — Progress Notes (Signed)
Quick Note:        Please notify pt her urine culture was negative. If she is still having urinary issues, please have her return for further evaluation- this time without taking AZO or the pyridium prior to coming in (so it does not result in any false positives on the urinalysis).    ______

## 2015-04-08 ENCOUNTER — Ambulatory Visit
Admit: 2015-04-08 | Discharge: 2015-04-08 | Payer: PRIVATE HEALTH INSURANCE | Attending: Physician Assistant | Primary: Physician Assistant

## 2015-04-08 DIAGNOSIS — H6123 Impacted cerumen, bilateral: Secondary | ICD-10-CM

## 2015-04-08 NOTE — Progress Notes (Signed)
Quick Note:        Patient was in to see provider to get her results.    ______

## 2015-04-08 NOTE — Progress Notes (Signed)
Madison Peters is a 64 y.o. female who presents to the office today with the following:  Chief Complaint   Patient presents with   ??? Ear Fullness       HPI  Pt presents with ear wax in both ears.  Has decreased hearing loss in left ear due to wax.  Would like to have ears cleaned out today.  Otherwise feels well with no complaints.   Denies any ear pain/drainage/tinnitus.     Plans to make f/u next for FBW.      Review of Systems   Constitutional: Negative for fever and chills.   HENT: Positive for congestion and hearing loss. Negative for ear discharge, ear pain, sore throat and tinnitus.    Eyes: Negative.    Respiratory: Positive for cough (chronic, smoker). Negative for shortness of breath and wheezing.    Cardiovascular: Negative for chest pain and leg swelling.   Gastrointestinal: Negative for nausea, vomiting, abdominal pain and diarrhea.   Skin: Negative for rash.   Neurological: Negative for dizziness and headaches.       Allergies   Allergen Reactions   ??? Ace Inhibitors Cough   ??? Erythromycin Unknown (comments)   ??? Sulfa (Sulfonamide Antibiotics) Nausea and Vomiting       Current Outpatient Prescriptions   Medication Sig   ??? metFORMIN (GLUCOPHAGE) 500 mg tablet Take 500 mg by mouth two (2) times daily (with meals). Takes 2 in AM and 1 at NIGHT   ??? nitrofurantoin, macrocrystal-monohydrate, (MACROBID) 100 mg capsule Take 1 Cap by mouth two (2) times a day for 7 days.   ??? phenazopyridine (PYRIDIUM) 200 mg tablet Take 1 Tab by mouth three (3) times daily as needed for Pain for up to 3 days.   ??? amLODIPine (NORVASC) 10 mg tablet TAKE 1 TABLET BY MOUTH EVERY DAY   ??? levothyroxine (SYNTHROID) 50 mcg tablet TAKE 1 TABLET BY MOUTH EVERY DAY   ??? hydrochlorothiazide (HYDRODIURIL) 25 mg tablet TAKE 1 TABLET BY MOUTH EVERY DAY   ??? metoprolol succinate (TOPROL-XL) 50 mg XL tablet TAKE 1 TABLET BY MOUTH EVERY DAY   ??? FLUoxetine (PROZAC) 20 mg capsule Take 1 Cap by mouth two (2) times a day.    ??? atorvastatin (LIPITOR) 40 mg tablet Take 1 Tab by mouth daily.   ??? vitamin E (AQUA GEMS) 400 unit capsule Take  by mouth daily.   ??? acetaminophen (TYLENOL) 500 mg tablet Take  by mouth every six (6) hours as needed for Pain.   ??? SODIUM CHLORIDE (SALINE NASAL NA) by Nasal route. 2 sprays each nostril every 12 hours.   ??? aspirin (ASPIRIN) 325 mg tablet Take 325 mg by mouth daily.   ??? multivitamin with iron (DAILY MULTI-VITAMINS/IRON) tablet Take 1 Tab by mouth daily.   ??? Cholecalciferol, Vitamin D3, (VITAMIN D3) 1,000 unit cap Take  by mouth.   ??? cyanocobalamin 1,000 mcg tablet Take 1,000 mcg by mouth two (2) times a day.   ??? omega-3 fatty acids-vitamin e (FISH OIL) 1,000 mg cap Take 1 Cap by mouth four (4) times daily.     No current facility-administered medications for this visit.       Past Medical History   Diagnosis Date   ??? Emphysema    ??? Hypertension    ??? Anxiety    ??? Thyroid disease      hypothryoidism   ??? Diabetes (HCC)    ??? Headache(784.0)    ??? Dyspepsia    ???  IGT (impaired glucose tolerance)    ??? Lipoma      Left calf   ??? Kidney stones        Past Surgical History   Procedure Laterality Date   ??? Hx heent       Tonsillectomy at age 57       History     Social History   ??? Marital Status: MARRIED     Spouse Name: N/A   ??? Number of Children: N/A   ??? Years of Education: N/A     Social History Main Topics   ??? Smoking status: Current Every Day Smoker   ??? Smokeless tobacco: Never Used   ??? Alcohol Use: No   ??? Drug Use: No   ??? Sexual Activity:     Partners: Male     Other Topics Concern   ??? None     Social History Narrative       No family history on file.      Physical Exam:  BP 119/80 mmHg   Pulse 91   Temp(Src) 97.2 ??F (36.2 ??C) (Temporal)   Resp 12   Ht 5\' 6"  (1.676 m)   Wt 174 lb (78.926 kg)   BMI 28.10 kg/m2   SpO2 93%  Physical Exam   Constitutional: She is oriented to person, place, and time and well-developed, well-nourished, and in no distress.   HENT:   Head: Normocephalic and atraumatic.    Nose: Mucosal edema present. No rhinorrhea.   Mouth/Throat: Uvula is midline, oropharynx is clear and moist and mucous membranes are normal.   Prior to irrigation- Bilateral cerumen impaction. Otherwise no external ear abnls/pain/erythema/swelling.   Hearing loss in L ear to finger rub test, intact to right.   Eyes: Conjunctivae and EOM are normal.   Neck: Normal range of motion. Neck supple.   Cardiovascular: Normal rate, regular rhythm and normal heart sounds.    Pulmonary/Chest: Effort normal. No respiratory distress.   Lymphadenopathy:     She has no cervical adenopathy.   Neurological: She is alert and oriented to person, place, and time. Gait normal.   Skin: Skin is warm and dry. No rash noted. No erythema.   Psychiatric: Mood and affect normal.       Assessment/Plan:    ICD-10-CM ICD-9-CM    1. Bilateral impacted cerumen H61.23 380.4 REMOVAL IMPACTED CERUMEN IRRIGATION/LVG UNILAT   2. Hearing loss due to cerumen impaction, left H61.22 389.8 REMOVAL IMPACTED CERUMEN IRRIGATION/LVG UNILAT     380.4      Pt tolerated irrigation well and feels much better.  S/P- ear exam wnl. Canals clear. TMs intact, nl.   Hearing now intact bilat w/ finger rub test.    Will f/u soon for routine labs and HM due.    Follow-up Disposition:  Return if symptoms worsen or fail to improve.    Marlan Palau, PA-C

## 2015-04-08 NOTE — Patient Instructions (Signed)
Earwax Blockage: Care Instructions  Your Care Instructions     Earwax is a natural substance that protects the ear canal. Normally, earwax drains from the ears and does not cause problems. Sometimes earwax builds up and hardens. Earwax blockage (also called cerumen impaction) can cause some loss of hearing and pain. When wax is tightly packed, you will need to have your doctor remove it.  Follow-up care is a key part of your treatment and safety. Be sure to make and go to all appointments, and call your doctor if you are having problems. It???s also a good idea to know your test results and keep a list of the medicines you take.  How can you care for yourself at home?  ?? Do not try to remove earwax with cotton swabs, fingers, or other objects. This can make the blockage worse and damage the eardrum.  ?? If your doctor recommends that you try to remove earwax at home:  ?? Soften and loosen the earwax with warm mineral oil. You also can try hydrogen peroxide mixed with an equal amount of room temperature water. Place 2 drops of the fluid, warmed to body temperature, in the ear two times a day for up to 5 days.  ?? Once the wax is loose and soft, all that is usually needed to remove it from the ear canal is a gentle, warm shower. Direct the water into the ear, then tip your head to let the earwax drain out. Dry your ear thoroughly with a hair dryer set on low. Hold the dryer several inches from your ear.  ?? If the warm mineral oil and shower do not work, use an over-the-counter wax softener followed by gentle flushing with an ear syringe each night for a week or two. Make sure the flushing solution is body temperature. Cool or hot fluids in the ear can cause dizziness.  When should you call for help?  Call your doctor now or seek immediate medical care if:  ?? Pus or blood drains from your ear.  ?? Your ears are ringing or feel full.  ?? You have a loss of hearing.   Watch closely for changes in your health, and be sure to contact your doctor if:  ?? You have pain or reduced hearing after 1 week of home treatment.  ?? You have any new symptoms, such as nausea or balance problems.  Where can you learn more?  Go to http://www.healthwise.net/[facility]  Enter Q495 in the search box to learn more about "Earwax Blockage: Care Instructions."  ?? 2006-2016 [employer]. Care instructions adapted under license by [employer] (which disclaims liability or warranty for this information). This care instruction is for use with your licensed healthcare professional. If you have questions about a medical condition or this instruction, always ask your healthcare professional. [employer]wise, Inc[employer] disclaims any warranty or liability for your use of this information.  Content Version: 10.9.538570; Current as of: July 23, 2014

## 2015-04-13 ENCOUNTER — Ambulatory Visit
Admit: 2015-04-13 | Discharge: 2015-04-13 | Payer: PRIVATE HEALTH INSURANCE | Attending: Physician Assistant | Primary: Physician Assistant

## 2015-04-13 DIAGNOSIS — E119 Type 2 diabetes mellitus without complications: Secondary | ICD-10-CM

## 2015-04-13 LAB — AMB POC URINALYSIS DIP STICK MANUAL W/ MICRO
Bilirubin (UA POC): NEGATIVE
Blood (UA POC): NEGATIVE
Glucose (UA POC): NEGATIVE
Ketones (UA POC): NEGATIVE
Leukocyte esterase (UA POC): NEGATIVE
Nitrites (UA POC): NEGATIVE
Specific gravity (UA POC): 1.01 (ref 1.001–1.035)
Urobilinogen (UA POC): 0.2 (ref 0.2–1)
pH (UA POC): 5.5 (ref 4.6–8.0)

## 2015-04-13 NOTE — Progress Notes (Signed)
Madison Peters is a 64 y.o. female who presents to the office today with the following:  Chief Complaint   Patient presents with   ??? Follow-up     on DM, is fasting       HPI  Pt doing well.  Known T2DM and HTN.  BPs have been good.  Has some questions about DM diet.  Does use some breading on meats.  Has chocolate candy on occasion, reports cravings.  Here for diabetes f/u with repeat labs.  HgA1c's in 8's for past cple yrs, last 8.5 in April.  No complaints today.  Denies any HA, CP, SOB, and TIA sxs.   No GI or GU problems.    Recently had ears irrigated and right one "feels weird" and would like it to be looked at.  Has no pain, drainage, hearing loss, or tinnitus.     Does not recall last eye exam.  Has been many years.  No complaints.    ROS  *See HPI.    Allergies   Allergen Reactions   ??? Ace Inhibitors Cough   ??? Erythromycin Unknown (comments)   ??? Sulfa (Sulfonamide Antibiotics) Nausea and Vomiting       Current Outpatient Prescriptions   Medication Sig   ??? metFORMIN (GLUCOPHAGE) 500 mg tablet Take 500 mg by mouth two (2) times daily (with meals). Takes 2 in AM and 1 at NIGHT   ??? amLODIPine (NORVASC) 10 mg tablet TAKE 1 TABLET BY MOUTH EVERY DAY   ??? levothyroxine (SYNTHROID) 50 mcg tablet TAKE 1 TABLET BY MOUTH EVERY DAY   ??? hydrochlorothiazide (HYDRODIURIL) 25 mg tablet TAKE 1 TABLET BY MOUTH EVERY DAY   ??? metoprolol succinate (TOPROL-XL) 50 mg XL tablet TAKE 1 TABLET BY MOUTH EVERY DAY   ??? FLUoxetine (PROZAC) 20 mg capsule Take 1 Cap by mouth two (2) times a day.   ??? atorvastatin (LIPITOR) 40 mg tablet Take 1 Tab by mouth daily.   ??? vitamin E (AQUA GEMS) 400 unit capsule Take  by mouth daily.   ??? acetaminophen (TYLENOL) 500 mg tablet Take  by mouth every six (6) hours as needed for Pain.   ??? SODIUM CHLORIDE (SALINE NASAL NA) by Nasal route. 2 sprays each nostril every 12 hours.   ??? aspirin (ASPIRIN) 325 mg tablet Take 325 mg by mouth daily.    ??? multivitamin with iron (DAILY MULTI-VITAMINS/IRON) tablet Take 1 Tab by mouth daily.   ??? Cholecalciferol, Vitamin D3, (VITAMIN D3) 1,000 unit cap Take  by mouth.   ??? cyanocobalamin 1,000 mcg tablet Take 1,000 mcg by mouth two (2) times a day.   ??? omega-3 fatty acids-vitamin e (FISH OIL) 1,000 mg cap Take 1 Cap by mouth four (4) times daily.     No current facility-administered medications for this visit.       Past Medical History   Diagnosis Date   ??? Emphysema    ??? Hypertension    ??? Anxiety    ??? Thyroid disease      hypothryoidism   ??? Diabetes (HCC)    ??? Headache(784.0)    ??? Dyspepsia    ??? IGT (impaired glucose tolerance)    ??? Lipoma      Left calf   ??? Kidney stones        Past Surgical History   Procedure Laterality Date   ??? Hx heent       Tonsillectomy at age 68       History  Social History   ??? Marital Status: MARRIED     Spouse Name: N/A   ??? Number of Children: N/A   ??? Years of Education: N/A     Social History Main Topics   ??? Smoking status: Current Every Day Smoker   ??? Smokeless tobacco: Never Used   ??? Alcohol Use: No   ??? Drug Use: No   ??? Sexual Activity:     Partners: Male     Other Topics Concern   ??? None     Social History Narrative       No family history on file.      Physical Exam:  BP 114/69 mmHg   Pulse 87   Temp(Src) 98 ??F (36.7 ??C) (Oral)   Resp 14   Ht  (1.676 m)   Wt 173 lb (78.472 kg)   BMI 27.94 kg/m2   SpO2 94%  Physical Exam   Constitutional: She is oriented to person, place, and time and well-developed, well-nourished, and in no distress.   HENT:   Head: Normocephalic and atraumatic.   Right Ear: External ear normal.   Left Ear: External ear normal.   Nose: Nose normal.   Mouth/Throat: Oropharynx is clear and moist.   Eyes: Conjunctivae and EOM are normal.   Neck: Normal range of motion. Neck supple.   Cardiovascular: Normal rate and regular rhythm.    Pulmonary/Chest: Effort normal and breath sounds normal.   Musculoskeletal:        Right foot: Normal.        Left foot: Normal.    FOOT EXAM wnl. Skin a little dry, but otherwise intact, no wounds/ulcers/cracking. CSM intact bilat. Two-point discrimination and proprioception intact.   Lymphadenopathy:     She has no cervical adenopathy.   Neurological: She is alert and oriented to person, place, and time. She has normal motor skills, normal sensation, normal strength and normal reflexes. Gait normal.   Skin: Skin is warm and dry.   Psychiatric: Mood and affect normal.       Assessment/Plan:  1. Type 2 diabetes mellitus without complication (HCC)  2. Essential hypertension with goal blood pressure less than 130/85  - AMB POC URINALYSIS DIP STICK MANUAL W/ MICRO  - METABOLIC PANEL, COMPREHENSIVE  - HEMOGLOBIN A1C WITH EAG  - REFERRAL TO DIETICIAN  - REFERRAL TO OPHTHALMOLOGY  - COLLECTION VENOUS BLOOD,VENIPUNCTURE  - LIPID PANEL    - Counseled on diabetes diet/exercise. Discussed previous labs.   - Rec dietician to assist with DM management with diet. Pt agrees.  - Also counseled on HM due.  - Will return in fall for vaccinations and report to pharm or HD for shingles.  - Referrals sent.    3. Screening  - HEPATITIS C AB    Total time spent with the patient today was 25 minutes of which more than 50% was spent face to face with the patient discussing diabetes diet/management, HM due, screenings/recommendations/vaccines.      Follow-up Disposition:  Return if symptoms worsen or fail to improve.    Marlan Palau, PA-C

## 2015-04-13 NOTE — Patient Instructions (Signed)
Diabetes Foot Health: Care Instructions  Your Care Instructions     When you have diabetes, your feet need extra care and attention. Diabetes can damage the nerve endings and blood vessels in your feet, making you less likely to notice when your feet are injured. Diabetes also limits your body's ability to fight infection and get blood to areas that need it. If you get a minor foot injury, it could become an ulcer or a serious infection. With good foot care, you can prevent most of these problems.  Caring for your feet can be quick and easy. Most of the care can be done when you are bathing or getting ready for bed.  Follow-up care is a key part of your treatment and safety. Be sure to make and go to all appointments, and call your doctor if you are having problems. It???s also a good idea to know your test results and keep a list of the medicines you take.  How can you care for yourself at home?  ?? Keep your blood sugar close to normal by watching what and how much you eat, monitoring blood sugar, taking medicines if prescribed, and getting regular exercise.  ?? Do not smoke. Smoking affects blood flow and can make foot problems worse. If you need help quitting, talk to your doctor about stop-smoking programs and medicines. These can increase your chances of quitting for good.  ?? Eat a diet that is low in fats. High fat intake can cause fat to build up in your blood vessels and decrease blood flow.  ?? Inspect your feet daily for blisters, cuts, cracks, or sores. If you cannot see well, use a mirror or have someone help you.  ?? Take care of your feet:  ?? Wash your feet every day. Use warm (not hot) water. Check the water temperature with your wrists or other part of your body, not your feet.  ?? Dry your feet well. Pat them dry. Do not rub the skin on your feet too hard. Dry well between your toes. If the skin on your feet stays moist, bacteria or a fungus can grow, which can lead to infection.   ?? Keep your skin soft. Use moisturizing skin cream to keep the skin on your feet soft and prevent calluses and cracks. But do not put the cream between your toes, and stop using any cream that causes a rash.  ?? Clean underneath your toenails carefully. Do not use a sharp object to clean underneath your toenails. Use the blunt end of a nail file or other rounded tool.  ?? Trim and file your toenails straight across to prevent ingrown toenails. Use a nail clipper, not scissors. Use an emery board to smooth the edges.  ?? Change socks daily. Socks without seams are best, because seams often rub the feet. You can find socks for people with diabetes from specialty catalogs.  ?? Look inside your shoes every day for things like gravel or torn linings, which could cause blisters or sores.  ?? Buy shoes that fit well:  ?? Look for shoes that have plenty of space around the toes. This helps prevent bunions and blisters.  ?? Try on shoes while wearing the kind of socks you will usually wear with the shoes.  ?? Avoid plastic shoes. They may rub your feet and cause blisters. Good shoes should be made of materials that are flexible and breathable, such as leather or cloth.  ?? Break in new shoes slowly by   wearing them for no more than an hour a day for several days. Take extra time to check your feet for red areas, blisters, or other problems after you wear new shoes.  ?? Do not go barefoot. Do not wear sandals, and do not wear shoes with very thin soles. Thin soles are easy to puncture. They also do not protect your feet from hot pavement or cold weather.  ?? Have your doctor check your feet during each visit. If you have a foot problem, see your doctor. Do not try to treat an early foot problem at home. Home remedies or treatments that you can buy without a prescription (such as corn removers) can be harmful.  ?? Always get early treatment for foot problems. A minor irritation can lead to a major problem if not properly cared for early.   When should you call for help?  Call your doctor now or seek immediate medical care if:  ?? You have a foot sore, an ulcer or break in the skin that is not healing after 4 days, bleeding corns or calluses, or an ingrown toenail.  ?? You have blue or black areas, which can mean bruising or blood flow problems.  ?? You have peeling skin or tiny blisters between your toes or cracking or oozing of the skin.  ?? You have a fever for more than 24 hours and a foot sore.  ?? You have new numbness or tingling in your feet that does not go away after you move your feet or change positions.  ?? You have unexplained or unusual swelling of the foot or ankle.  Watch closely for changes in your health, and be sure to contact your doctor if:  ?? You cannot do proper foot care.  Where can you learn more?  Go to http://www.healthwise.net/[facility]  Enter A739 in the search box to learn more about "Diabetes Foot Health: Care Instructions."  ?? 2006-2016 [employer]. Care instructions adapted under license by [employer] (which disclaims liability or warranty for this information). This care instruction is for use with your licensed healthcare professional. If you have questions about a medical condition or this instruction, always ask your healthcare professional. [employer], [employer] disclaims any warranty or liability for your use of this information.  Content Version: 10.9.538570; Current as of: Jan 22, 2014        Nutrition Tips for Diabetes: After Your Visit  Your Care Instructions  A healthy diet is important to manage diabetes. It helps you lose weight (if you need to) and keep it off. It gives you the nutrition and energy your body needs and helps prevent heart disease. But a diet for diabetes does not mean that you have to eat special foods. You can eat what your family eats, including occasional sweets and other favorites. But you do  have to pay attention to how often you eat and how much you eat of certain foods. The right plan for you will give you meals that help you keep your blood sugar at healthy levels.  Try to eat a variety of foods and to spread carbohydrate throughout the day. Carbohydrate raises blood sugar higher and more quickly than any other nutrient does. Carbohydrate is found in sugar, breads and cereals, fruit, starchy vegetables such as potatoes and corn, and milk and yogurt.  You may want to work with a dietitian or diabetes educator to help you plan meals and snacks. A dietitian or diabetes educator also can help you lose   weight if that is one of your goals. The following tips can help you enjoy your meals and stay healthy.  Follow-up care is a key part of your treatment and safety. Be sure to make and go to all appointments, and call your doctor if you are having problems. It???s also a good idea to know your test results and keep a list of the medicines you take.  How can you care for yourself at home?  ?? Learn which foods have carbohydrate and how much carbohydrate to eat. A dietitian or diabetes educator can help you learn to keep track of how much carbohydrate you eat.  ?? Spread carbohydrate throughout the day. Eat some carbohydrate at all meals, but do not eat too much at any one time.  ?? Plan meals to include food from all the food groups. These are the food groups and some example portion sizes:  ?? Grains: 1 slice of bread (1 ounce), ?? cup of cooked cereal, and 1/3 cup of cooked pasta or rice. These have about 15 grams of carbohydrate in a serving. Choose whole grains such as whole wheat bread or crackers, oatmeal, and brown rice more often than refined grains.  ?? Fruit: 1 small fresh fruit, such as an apple or orange; ?? of a banana; ?? cup of chopped, cooked, or canned fruit; ?? cup of fruit juice; 1 cup of melon or raspberries; and 2 tablespoons of dried fruit. These have about 15 grams of carbohydrate in a serving.   ?? Dairy: 1 cup of nonfat or low-fat milk and 2/3 cup of plain yogurt. These have about 15 grams of carbohydrate in a serving.  ?? Protein foods: Beef, chicken, [place], fish, eggs, tofu, cheese, cottage cheese, and peanut butter. A serving size of meat is 3 ounces, which is about the size of a deck of cards. Examples of meat substitute serving sizes (equal to 1 ounce of meat) are 1/4 cup of cottage cheese, 1 egg, 1 tablespoon of peanut butter, and ?? cup of tofu. These have very little or no carbohydrate per serving.  ?? Vegetables: Starchy vegetables such as ?? cup of cooked dried beans, peas, potatoes, or corn have about 15 grams of carbohydrate. Nonstarchy vegetables have very little carbohydrate, such as 1 cup of raw leafy vegetables (such as spinach), ?? cup of other vegetables (cooked or chopped), and 3/4 cup of vegetable juice.  ?? Use the plate format to plan meals. It is a good, quick way to make sure that you have a balanced meal. It also helps you spread carbohydrate throughout the day. You divide your plate by types of foods. Put vegetables on half the plate, meat or meat substitutes on one-quarter of the plate, and a grain or starchy vegetable (such as brown rice or a potato) in the final quarter of the plate. To this you can add a small piece of fruit and 1 cup of milk or yogurt, depending on how much carbohydrate you are supposed to eat at a meal.  ?? Talk to your dietitian or diabetes educator about ways to add limited amounts of sweets into your meal plan. You can eat these foods now and then, as long as you include the amount of carbohydrate they have in your daily carbohydrate allowance.  ?? If you drink alcohol, limit it to no more than 1 drink a day for women and 2 drinks a day for men. If you are pregnant, no amount of alcohol is known to be safe.  ??  Protein, fat, and fiber do not raise blood sugar as much as carbohydrate does. If you eat a lot of these nutrients in a meal, your blood sugar will  rise more slowly than it would otherwise.  ?? Limit saturated fats, such as those from meat and dairy products. Try to replace it with monounsaturated fat, such as olive oil. This is a healthier choice because people who have diabetes are at higher-than-average risk of heart disease. But use a modest amount of olive oil. A tablespoon of olive oil has 14 grams of fat and 120 calories.  ?? Exercise lowers blood sugar. If you take insulin by shots or pump, you can use less than you would if you were not exercising. Keep in mind that timing matters. If you exercise within 1 hour after a meal, your body may need less insulin for that meal than it would if you exercised 3 hours after the meal. Test your blood sugar to find out how exercise affects your need for insulin.  ?? Exercise on most days of the week. Aim for at least 30 minutes. Exercise helps you stay at a healthy weight and helps your body use insulin. Walking is an easy way to get exercise. Gradually increase the amount you walk every day. You also may want to swim, bike, or do other activities.  When you eat out  ?? Learn to estimate the serving sizes of foods that have carbohydrate. If you measure food at home, it will be easier to estimate the amount in a serving of restaurant food.  ?? If the meal you order has too much carbohydrate (such as potatoes, corn, or baked beans), ask to have a low-carbohydrate food instead. Ask for a salad or green vegetables.  ?? If you use insulin, check your blood sugar before and after eating out to help you plan how much to eat in the future.  ?? If you eat more carbohydrate at a meal than you had planned, take a walk or do other exercise. This will help lower your blood sugar.   Where can you learn more?   Go to [employer]  Enter [phone] in the search box to learn more about "Nutrition Tips for Diabetes: After Your Visit."   ?? 2006-2014 [employer]. Care instructions adapted under  license by [employer] (which disclaims liability or warranty for this information). This care instruction is for use with your licensed healthcare professional. If you have questions about a medical condition or this instruction, always ask your healthcare professional. [employer], [employer] disclaims any warranty or liability for your use of this information.  Content Version: 10.2.346038; Current as of: February 04, 2013              Learning About Benefits From Quitting Smoking  How does quitting smoking make you healthier?  If you're thinking about quitting smoking, you may have a few reasons to be smoke-free. Your health may be one of them.  ?? When you quit smoking, you lower your risks for cancer, lung disease, heart attack, stroke, blood vessel disease, and blindness from macular degeneration.  ?? When you're smoke-free, you get sick less often, and you heal faster. You are less likely to get colds, flu, bronchitis, and pneumonia.  ?? As a nonsmoker, you may find that your mood is better and you are less stressed.  When and how will you feel healthier?  Quitting has real health benefits that start from day 1 of being smoke-free. And the longer  you stay smoke-free, the healthier you get and the better you feel.  The first hours  ?? After just 20 minutes, your blood pressure and heart rate go down. That means there's less stress on your heart and blood vessels.  ?? Within 12 hours, the level of carbon monoxide in your blood drops back to normal. That makes room for more oxygen. With more oxygen in your body, you may notice that you have more energy than when you smoked.  After 2 weeks  ?? Your lungs start to work better.  ?? Your risk of heart attack starts to drop.  After 1 month  ?? When your lungs are clear, you cough less and breathe deeper, so it's easier to be active.  ?? Your sense of taste and smell return. That means you can enjoy food more than you have since you started smoking.  Over the years   ?? After 1 year, your risk of heart disease is half what it would be if you kept smoking.  ?? After 5 years, your risk of stroke starts to shrink. Within a few years after that, it's about the same as if you'd never smoked.  ?? After 10 years, your risk of dying from lung cancer is cut by about half. And your risk for many other types of cancer is lower too.  How would quitting help others in your life?  When you quit smoking, you improve the health of everyone who now breathes in your smoke.  ?? Their heart, lung, and cancer risks drop, much like yours.  ?? They are sick less. For babies and small children, living smoke-free means they're less likely to have ear infections, pneumonia, and bronchitis.  ?? If you're a woman who is or will be pregnant someday, quitting smoking means a healthier newborn.  ?? Children who are close to you are less likely to become adult smokers.  Where can you learn more?  Go to [employer]  Enter O319 in the search box to learn more about "Learning About Benefits From Quitting Smoking."  ?? 2006-2016 [employer]. Care instructions adapted under license by [employer] (which disclaims liability or warranty for this information). This care instruction is for use with your licensed healthcare professional. If you have questions about a medical condition or this instruction, always ask your healthcare professional. [employer] disclaims any warranty or liability for your use of this information.  Content Version: 10.9.538570; Current as of: April 23, 2014        Hemoglobin A1c: About This Test  What is it?  Hemoglobin A1c is a blood test that checks your average blood sugar level over the past 2 to 3 months. This test also is called a glycohemoglobin test or an A1c test.  Why is this test done?  The A1c test is done to check how well your diabetes has been controlled  over the past 2 to 3 months. Your doctor can use this information to adjust your medicine and diabetes treatment, if needed.  How can you prepare for the test?  You do not need to stop eating before you have an A1c test. This test can be done at any time during the day, even after a meal.  What happens during the test?  The health professional taking a sample of your blood will:  ?? Wrap an elastic band around your upper arm. This makes the veins below the band larger so it is easier to put a needle into  the vein.  ?? Clean the needle site with alcohol.  ?? Put the needle into the vein.  ?? Attach a tube to the needle to fill it with blood.  ?? Remove the band from your arm when enough blood is collected.  ?? Put a gauze pad or cotton ball over the needle site as the needle is removed.  ?? Put pressure on the site and then put on a bandage.  What else should you know about the test?  The test result is usually given as a percentage. The normal A1c is less than 5.7%.  The A1c test result also can be used to find your estimated average glucose, or eAG. Your eAG and A1c show the same thing in two different ways. They both help you learn more about your average blood sugar range over the past 2 to 3 months.  Where can you learn more?  Go to [employer]  Enter U216 in the search box to learn more about "Hemoglobin A1c: About This Test."  ?? 2006-2016 [employer]. Care instructions adapted under license by [employer] (which disclaims liability or warranty for this information). This care instruction is for use with your licensed healthcare professional. If you have questions about a medical condition or this instruction, always ask your healthcare professional. [employer] disclaims any warranty or liability for your use of this information.  Content Version: 10.9.538570; Current as of: Jan 22, 2014

## 2015-04-14 LAB — METABOLIC PANEL, COMPREHENSIVE
A-G Ratio: 2.6 — ABNORMAL HIGH (ref 1.1–2.5)
ALT (SGPT): 19 IU/L (ref 0–32)
AST (SGOT): 15 IU/L (ref 0–40)
Albumin: 5.2 g/dL — ABNORMAL HIGH (ref 3.6–4.8)
Alk. phosphatase: 75 IU/L (ref 39–117)
BUN/Creatinine ratio: 33 — ABNORMAL HIGH (ref 11–26)
BUN: 21 mg/dL (ref 8–27)
Bilirubin, total: 0.4 mg/dL (ref 0.0–1.2)
CO2: 24 mmol/L (ref 18–29)
Calcium: 9.8 mg/dL (ref 8.7–10.3)
Chloride: 94 mmol/L — ABNORMAL LOW (ref 97–108)
Creatinine: 0.64 mg/dL (ref 0.57–1.00)
GFR est AA: 111 mL/min/{1.73_m2} (ref >59)
GFR est non-AA: 96 mL/min/{1.73_m2} (ref >59)
GLOBULIN, TOTAL: 2 g/dL (ref 1.5–4.5)
Glucose: 194 mg/dL — ABNORMAL HIGH (ref 65–99)
Potassium: 3.6 mmol/L (ref 3.5–5.2)
Protein, total: 7.2 g/dL (ref 6.0–8.5)
Sodium: 139 mmol/L (ref 134–144)

## 2015-04-14 LAB — HEPATITIS C AB: Hep C Virus Ab: <0.1 s/co ratio (ref 0.0–0.9)

## 2015-04-14 LAB — HEMOGLOBIN A1C WITH EAG
Estimated average glucose: 177 mg/dL
Hemoglobin A1c: 7.8 % — ABNORMAL HIGH (ref 4.8–5.6)

## 2015-04-14 LAB — HEPATITIS C ANTIBODY: HCV Ab: <0.1 s/co ratio (ref 0.0–0.9)

## 2015-04-14 NOTE — Progress Notes (Signed)
A1c is improving. Hep C negative. CBC unremarkable- may be a little dehydrated, make sure drinking plenty of water. F/U in 3 months or sooner if problems.

## 2015-04-14 NOTE — Progress Notes (Signed)
[facility] to give lab results.

## 2015-04-15 NOTE — Progress Notes (Signed)
Notified of lab results.

## 2015-11-01 ENCOUNTER — Ambulatory Visit
Admit: 2015-11-01 | Discharge: 2015-11-01 | Payer: PRIVATE HEALTH INSURANCE | Attending: Physician Assistant | Primary: Physician Assistant

## 2015-11-01 DIAGNOSIS — I10 Essential (primary) hypertension: Secondary | ICD-10-CM

## 2015-11-01 MED ORDER — HYDROCHLOROTHIAZIDE 25 MG TAB
25 mg | ORAL_TABLET | ORAL | 0 refills | Status: DC
Start: 2015-11-01 — End: 2016-02-07

## 2015-11-01 MED ORDER — LEVOTHYROXINE 50 MCG TAB
50 mcg | ORAL_TABLET | ORAL | 0 refills | Status: DC
Start: 2015-11-01 — End: 2016-02-07

## 2015-11-01 MED ORDER — AMLODIPINE 10 MG TAB
10 mg | ORAL_TABLET | ORAL | 0 refills | Status: DC
Start: 2015-11-01 — End: 2016-02-07

## 2015-11-01 MED ORDER — ATORVASTATIN 40 MG TAB
40 mg | ORAL_TABLET | Freq: Every day | ORAL | 0 refills | Status: AC
Start: 2015-11-01 — End: 2016-01-30

## 2015-11-01 MED ORDER — METOPROLOL SUCCINATE SR 50 MG 24 HR TAB
50 mg | ORAL_TABLET | ORAL | 0 refills | Status: DC
Start: 2015-11-01 — End: 2016-02-07

## 2015-11-01 MED ORDER — FLUOXETINE 20 MG CAP
20 mg | ORAL_CAPSULE | Freq: Two times a day (BID) | ORAL | 0 refills | Status: AC
Start: 2015-11-01 — End: 2016-01-30

## 2015-11-01 MED ORDER — METFORMIN 500 MG TAB
500 mg | ORAL_TABLET | ORAL | 0 refills | Status: DC
Start: 2015-11-01 — End: 2016-02-07

## 2015-11-01 NOTE — Progress Notes (Addendum)
Madison Peters is a 65 y.o. female who presents to the office today with the following:  Chief Complaint   Patient presents with   ??? Follow-up     3 month DM       HPI  Here for f/u of chronic conditions.  Also due for all med refills and FBW.  Is not fasting today and would like to return [place] for labs.  Reports tolerating all meds well w/o SEs.    HTN  Does not check at home.  Occasionally checks at [facility] and says it is always wnl, similar to reading today.  No recent HAs, CP, SOB, or TIA sxs.    Anxiety  Well-controlled with Prozac.  Denies any hx of depression/SI/HI.    T2DM, NID  Taking 2 Metformin AM, 1 in PM.  Doing well..  Lab Results   Component Value Date/Time    Hemoglobin A1c 7.8 04/13/2015 09:50 AM   Has felt lightheaded at times, like BS is low, eats some chocolate or PB and feels better.  But does not check BS at home so unsure.    Review of Systems   Constitutional: Negative for fever and malaise/fatigue.   Eyes: Negative.    Respiratory: Positive for cough (chronic, no new/worsening). Negative for shortness of breath and wheezing.    Cardiovascular: Negative for chest pain, palpitations and leg swelling.   Gastrointestinal: Negative.    Genitourinary: Negative.    Musculoskeletal: Negative for myalgias.   Skin: Negative for rash.   Neurological: Negative.  Negative for headaches.   Psychiatric/Behavioral: Negative for depression, hallucinations, memory loss, substance abuse and suicidal ideas. The patient is nervous/anxious (well-controlled). The patient does not have insomnia.        Allergies   Allergen Reactions   ??? Ace Inhibitors Cough   ??? Erythromycin Unknown (comments)   ??? Sulfa (Sulfonamide Antibiotics) Nausea and Vomiting       Current Outpatient Prescriptions   Medication Sig   ??? metFORMIN (GLUCOPHAGE) 500 mg tablet Takes 2 in AM and 1 at NIGHT   ??? amLODIPine (NORVASC) 10 mg tablet TAKE 1 TABLET BY MOUTH EVERY DAY    ??? levothyroxine (SYNTHROID) 50 mcg tablet TAKE 1 TABLET BY MOUTH EVERY DAY   ??? hydroCHLOROthiazide (HYDRODIURIL) 25 mg tablet TAKE 1 TABLET BY MOUTH EVERY DAY   ??? metoprolol succinate (TOPROL-XL) 50 mg XL tablet TAKE 1 TABLET BY MOUTH EVERY DAY   ??? FLUoxetine (PROZAC) 20 mg capsule Take 1 Cap by mouth two (2) times a day for 90 days.   ??? atorvastatin (LIPITOR) 40 mg tablet Take 1 Tab by mouth daily for 90 days.   ??? vitamin E (AQUA GEMS) 400 unit capsule Take 1,000 Units by mouth daily.   ??? acetaminophen (TYLENOL) 500 mg tablet Take  by mouth every six (6) hours as needed for Pain.   ??? SODIUM CHLORIDE (SALINE NASAL NA) by Nasal route. 2 sprays each nostril every 12 hours.   ??? aspirin (ASPIRIN) 325 mg tablet Take 325 mg by mouth daily.   ??? multivitamin with iron (DAILY MULTI-VITAMINS/IRON) tablet Take 1 Tab by mouth daily.   ??? Cholecalciferol, Vitamin D3, (VITAMIN D3) 1,000 unit cap Take 5,000 Units by mouth daily.   ??? cyanocobalamin 1,000 mcg tablet Take  by mouth daily.   ??? omega-3 fatty acids-vitamin e (FISH OIL) 1,000 mg cap Take 1 Cap by mouth four (4) times daily.     No current facility-administered medications for this visit.  Past Medical History:   Diagnosis Date   ??? Anxiety    ??? Diabetes (HCC)    ??? Dyspepsia    ??? Emphysema    ??? Headache(784.0)    ??? Hypertension    ??? IGT (impaired glucose tolerance)    ??? Kidney stones    ??? Lipoma     Left calf   ??? Thyroid disease     hypothryoidism       Past Surgical History:   Procedure Laterality Date   ??? HX HEENT      Tonsillectomy at age 6       Social History     Social History   ??? Marital status: MARRIED     Spouse name: N/A   ??? Number of children: N/A   ??? Years of education: N/A     Social History Main Topics   ??? Smoking status: Current Every Day Smoker   ??? Smokeless tobacco: Never Used   ??? Alcohol use No   ??? Drug use: No   ??? Sexual activity: Yes     Partners: Male     Other Topics Concern   ??? None     Social History Narrative       Family History    Problem Relation Age of Onset   ??? Heart Disease Mother    ??? Other Father      AAA         Physical Exam:  Visit Vitals   ??? BP 118/62 (BP 1 Location: Left arm, BP Patient Position: Sitting)   ??? Pulse 85   ??? Temp 97.9 ??F (36.6 ??C) (Oral)   ??? Resp 16   ??? Ht 5\' 6"  (1.676 m)   ??? Wt 171 lb 9.6 oz (77.8 kg)   ??? SpO2 93%   ??? BMI 27.7 kg/m2     Physical Exam   Constitutional: She is oriented to person, place, and time and well-developed, well-nourished, and in no distress.   HENT:   Head: Normocephalic and atraumatic.   Right Ear: External ear normal.   Left Ear: External ear normal.   Nose: Nose normal.   Mouth/Throat: Oropharynx is clear and moist.   Eyes: Conjunctivae and EOM are normal.   Neck: Normal range of motion. Neck supple.   Cardiovascular: Normal rate and regular rhythm.    Pulmonary/Chest: Effort normal and breath sounds normal.   Abdominal: Soft. There is no tenderness.   Musculoskeletal: Normal range of motion.   Lymphadenopathy:     She has no cervical adenopathy.   Neurological: She is alert and oriented to person, place, and time. Gait normal.   Skin: Skin is warm and dry.   Psychiatric: Mood and affect normal.   Nursing note and vitals reviewed.      Assessment/Plan:    ICD-10-CM ICD-9-CM    1. Essential hypertension I10 401.9 METABOLIC PANEL, COMPREHENSIVE      CBC WITH AUTOMATED DIFF      amLODIPine (NORVASC) 10 mg tablet      hydroCHLOROthiazide (HYDRODIURIL) 25 mg tablet      metoprolol succinate (TOPROL-XL) 50 mg XL tablet   2. Controlled type 2 diabetes mellitus without complication, unspecified long term insulin use status (HCC) E11.9 250.00 HEMOGLOBIN A1C WITH EAG      METABOLIC PANEL, COMPREHENSIVE      metFORMIN (GLUCOPHAGE) 500 mg tablet      AMB POC URINALYSIS DIP STICK MANUAL W/O MICRO      MICROALBUMIN, UR, RAND  W/ MICROALBUMIN/CREA RATIO   3. Thyroid disease E07.9 246.9 TSH 3RD GENERATION      levothyroxine (SYNTHROID) 50 mcg tablet    4. Hyperlipidemia, unspecified hyperlipidemia type E78.5 272.4 LIPID PANEL      atorvastatin (LIPITOR) 40 mg tablet   5. Anxiety F41.9 300.00 FLUoxetine (PROZAC) 20 mg capsule   6. Encounter for screening fecal occult blood testing Z12.11 V76.51 OCCULT BLOOD, IMMUNOASSAY (FIT)   7. Screening for cervical cancer Z12.4 V76.2    8. Encounter for immunization Z23 V03.89 INFLUENZA VIRUS VAC QUAD,SPLIT,PRESV FREE SYRINGE 3/> YRS IM   9. Smoker F17.200 305.1 - Counseled again on smoking cessation. Pt reports has succesfully cut back from 2.5 packs/day to 1ppd.  02 stable, at pt's baseline, does not feel sob.     Will call with lab results after pt returns fasting on Thur.    Follow-up Disposition:  Return in about 3 months (around 01/29/2016).    Marlan Palau, PA-C

## 2015-11-01 NOTE — Progress Notes (Signed)
HM Due reviewed with patient, she is over due for an eye exam, she will go to [facility] to have done.  Patient does want a Prevnar 14 but will come back in a few weeks to get, she will RTC for a tetanus vaccine when she needs one, she doesn't remember when her last PAP was, she wants a referral to see Jasiya Dehoux but wants to make her own appointment.  She had a mammogram done a [facility], I weill request records.  Patient refuses a Zoster vaccine, she will get a flu shot today as well as a HGB A1c today.  Benito Mccreedy, RN

## 2015-11-01 NOTE — Patient Instructions (Signed)
Noninsulin Medicines for Type 2 Diabetes: Care Instructions  Your Care Instructions  There are different types of noninsulin medicines for diabetes. Each works in a different way to help you control your blood sugar. Some types help your body make insulin to lower your blood sugar. Others lower how much insulin your body needs. Some can slow how quickly your body digests sugars. And some can remove extra glucose through your urine.  Some of these medicines are:  ?? Alpha-glucosidase inhibitors. These keep starches from breaking down. This means that they lower the amount of glucose absorbed when you eat. They do not help your body make more insulin. So they will not cause low blood sugar unless they are used with other medicines for diabetes. They include acarbose and miglitol.  ?? DPP-4 inhibitors. These help your body increase the level of insulin after eating. They also help your body make less of a hormone that raises blood sugar. They include linagliptin, saxagliptin, and sitagliptin.  ?? Incretin hormones (GLP-1 receptor agonists). Your body makes a protein that can raise your insulin level, lower your blood sugar, and make you less hungry. You can inject hormone medicines that work the same way as this protein. They include exenatide and liraglutide.  ?? Meglitinides. These help your body release insulin. They also help slow how your body digests sugars. In this way, they prevent your blood sugar from rising too fast after you eat. They include nateglinide and repaglinide.  ?? Metformin. This lowers how much glucose your liver makes. And it helps you respond better to insulin. It also lowers the amount of stored sugar that your liver releases when you are not eating.  ?? Sodium glucose co-transporter 2 inhibitors (SGLT2 inhibitors). These help to remove extra glucose through your urine. They may also help some people lose weight. They include canagliflozin, dapagliflozin, and empagliflozin.   ?? Sulfonylureas. These help your body release more insulin. Some work for many hours and can cause low blood sugar if you don't eat as you expected. They include glipizide and glyburide.  ?? Thiazolidinediones. These reduce the amount of blood glucose. They also help you respond better to insulin. They include pioglitazone and rosiglitazone.  You may need to take more than one medicine for diabetes. Two or more medicines may work better to lower your blood sugar level than just one medicine.  Follow-up care is a key part of your treatment and safety. Be sure to make and go to all appointments, and call your doctor if you are having problems. It's also a good idea to know your test results and keep a list of the medicines you take.  How can you care for yourself at home?  ?? Eat a healthy diet. Get some exercise each day. This may help you to reduce how much medicine you need.  ?? Do not take other prescription or over-the-counter medicines, vitamins, herbal products, or supplements without talking to your doctor first. Some medicines for type 2 diabetes can cause problems with other medicines or supplements.  ?? Tell your doctor if you plan to get pregnant. Some of these drugs are not safe for pregnant women.  ?? Be safe with medicines. Take your medicines exactly as prescribed. Meglitinides or sulfonylureas can cause your blood sugar to drop very low. Call your doctor if you think you are having a problem with your medicine.  ?? Check your blood sugar levels often. You can use a glucose monitor. Keeping track can help you know how   certain foods, activities, and medicines affect your blood sugar. And it can help you keep your blood sugar from getting so low that it's not safe.  When should you call for help?  Call 911 anytime you think you may need emergency care. For example, call if:  ?? You passed out (lost consciousness), or you suddenly become very sleepy or confused. (You may have very low blood sugar.)   ?? You have symptoms of high blood sugar, such as:  ?? Blurred vision.  ?? Trouble staying awake or being woken up.  ?? Fast, deep breathing.  ?? Breath that smells fruity.  ?? Belly pain, not feeling hungry, and vomiting.  ?? Feeling confused.  Call your doctor now or seek immediate medical care if:  ?? You are sick and cannot control your blood sugar.  ?? You have been vomiting or have had diarrhea for more than 6 hours.  ?? Your blood sugar stays higher than the level your doctor has set for you.  ?? You have symptoms of low blood sugar, such as:  ?? Sweating.  ?? Feeling nervous, shaky, and weak.  ?? Extreme hunger and slight nausea.  ?? Dizziness and headache.  ?? Blurred vision.  ?? Confusion.  Watch closely for changes in your health, and be sure to contact your doctor if:  ?? You have a hard time knowing when your blood sugar is low.  ?? You have trouble keeping your blood sugar in the target range.  ?? You often have problems controlling your blood sugar.  ?? You have symptoms of long-term diabetes problems, such as:  ?? New vision changes.  ?? New pain, numbness, or tingling in your hands or feet.  ?? Skin problems.  Where can you learn more?  Go to [employer].  Enter H153 in the search box to learn more about "Noninsulin Medicines for Type 2 Diabetes: Care Instructions."  Current as of: April 06, 2015  Content Version: 11.1  ?? 2006-2016 [employer]. Care instructions adapted under license by [employer] (which disclaims liability or warranty for this information). If you have questions about a medical condition or this instruction, always ask your healthcare professional. [place] any warranty or liability for your use of this information.       High Blood Pressure: Care Instructions  Your Care Instructions  If your blood pressure is usually above 140/90, you have high blood pressure, or hypertension. That means the top number is 140 or higher or  the bottom number is 90 or higher, or both.  Despite what a lot of people think, high blood pressure usually doesn't cause headaches or make you feel dizzy or lightheaded. It usually has no symptoms. But it does increase your risk for heart attack, stroke, and kidney or eye damage. The higher your blood pressure, the more your risk increases.  Your doctor will give you a goal for your blood pressure. Your goal will be based on your health and your age. An example of a goal is to keep your blood pressure below 140/90.  Lifestyle changes, such as eating healthy and being active, are always important to help lower blood pressure. You might also take medicine to reach your blood pressure goal.  Follow-up care is a key part of your treatment and safety. Be sure to make and go to all appointments, and call your doctor if you are having problems. It's also a good idea to know your test results and keep a list  of the medicines you take.  How can you care for yourself at home?  Medical treatment  ?? If you stop taking your medicine, your blood pressure will go back up. You may take one or more types of medicine to lower your blood pressure. Be safe with medicines. Take your medicine exactly as prescribed. Call your doctor if you think you are having a problem with your medicine.  ?? Talk to your doctor before you start taking aspirin every day. Aspirin can help certain people lower their risk of a heart attack or stroke. But taking aspirin isn't right for everyone, because it can cause serious bleeding.  ?? See your doctor regularly. You may need to see the doctor more often at first or until your blood pressure comes down.  ?? If you are taking blood pressure medicine, talk to your doctor before you take decongestants or anti-inflammatory medicine, such as ibuprofen. Some of these medicines can raise blood pressure.  ?? Learn how to check your blood pressure at home.  Lifestyle changes   ?? Stay at a healthy weight. This is especially important if you put on weight around the waist. Losing even 10 pounds can help you lower your blood pressure.  ?? If your doctor recommends it, get more exercise. Walking is a good choice. Bit by bit, increase the amount you walk every day. Try for at least 30 minutes on most days of the week. You also may want to swim, bike, or do other activities.  ?? Avoid or limit alcohol. Talk to your doctor about whether you can drink any alcohol.  ?? Try to limit how much sodium you eat to less than 2,300 milligrams (mg) a day. Your doctor may ask you to try to eat less than 1,500 mg a day.  ?? Eat plenty of fruits (such as bananas and oranges), vegetables, legumes, whole grains, and low-fat dairy products.  ?? Lower the amount of saturated fat in your diet. Saturated fat is found in animal products such as milk, cheese, and meat. Limiting these foods may help you lose weight and also lower your risk for heart disease.  ?? Do not smoke. Smoking increases your risk for heart attack and stroke. If you need help quitting, talk to your doctor about stop-smoking programs and medicines. These can increase your chances of quitting for good.  When should you call for help?  Call 911 anytime you think you may need emergency care. This may mean having symptoms that suggest that your blood pressure is causing a serious heart or blood vessel problem. Your blood pressure may be over 180/110.  For example, call 911 if:  ?? You have symptoms of a heart attack. These may include:  ?? Chest pain or pressure, or a strange feeling in the chest.  ?? Sweating.  ?? Shortness of breath.  ?? Nausea or vomiting.  ?? Pain, pressure, or a strange feeling in the back, neck, jaw, or upper belly or in one or both shoulders or arms.  ?? Lightheadedness or sudden weakness.  ?? A fast or irregular heartbeat.  ?? You have symptoms of a stroke. These may include:   ?? Sudden numbness, tingling, weakness, or loss of movement in your face, arm, or leg, especially on only one side of your body.  ?? Sudden vision changes.  ?? Sudden trouble speaking.  ?? Sudden confusion or trouble understanding simple statements.  ?? Sudden problems with walking or balance.  ?? A sudden, severe headache that  is different from past headaches.  ?? You have severe back or belly pain.  Do not wait until your blood pressure comes down on its own. Get help right away.  Call your doctor now or seek immediate care if:  ?? Your blood pressure is much higher than normal (such as 180/110 or higher), but you don't have symptoms.  ?? You think high blood pressure is causing symptoms, such as:  ?? Severe headache.  ?? Blurry vision.  Watch closely for changes in your health, and be sure to contact your doctor if:  ?? Your blood pressure measures 140/90 or higher at least 2 times. That means the top number is 140 or higher or the bottom number is 90 or higher, or both.  ?? You think you may be having side effects from your blood pressure medicine.  ?? Your blood pressure is usually normal, but it goes above normal at least 2 times.  Where can you learn more?  Go to [employer].  Enter [phone] in the search box to learn more about "High Blood Pressure: Care Instructions."  Current as of: April 11, 2015  Content Version: 11.1  ?? 2006-2016 [employer]. Care instructions adapted under license by [employer] (which disclaims liability or warranty for this information). If you have questions about a medical condition or this instruction, always ask your healthcare professional. [employer], [employer] disclaims any warranty or liability for your use of this information.       Stopping Smoking: Care Instructions  Your Care Instructions  Cigarette smokers crave the nicotine in cigarettes. Giving it up is much harder than simply changing a habit. Your body has to stop craving the  nicotine. It is hard to quit, but you can do it. There are many tools that people use to quit smoking. You may find that combining tools works best for you.  There are several steps to quitting. First you get ready to quit. Then you get support to help you. After that, you learn new skills and behaviors to become a nonsmoker. For many people, a necessary step is getting and using medicine.  Your doctor will help you set up the plan that best meets your needs. You may want to attend a smoking cessation program to help you quit smoking. When you choose a program, look for one that has proven success. Ask your doctor for ideas. You will greatly increase your chances of success if you take medicine as well as get counseling or join a cessation program.  Some of the changes you feel when you first quit tobacco are uncomfortable. Your body will miss the nicotine at first, and you may feel short-tempered and grumpy. You may have trouble sleeping or concentrating. Medicine can help you deal with these symptoms. You may struggle with changing your smoking habits and rituals. The last step is the tricky one: Be prepared for the smoking urge to continue for a time. This is a lot to deal with, but keep at it. You will feel better.  Follow-up care is a key part of your treatment and safety. Be sure to make and go to all appointments, and call your doctor if you are having problems. It???s also a good idea to know your test results and keep a list of the medicines you take.  How can you care for yourself at home?  ?? Ask your family, friends, and coworkers for support. You have a better chance of quitting if you have help and support.  ??  Join a support group, such as Nicotine Anonymous, for people who are trying to quit smoking.  ?? Consider signing up for a smoking cessation program, such as the American Lung Association's Freedom from Smoking program.  ?? Set a quit date. Pick your date carefully so that it is not right in the  middle of a big deadline or stressful time. Once you quit, do not even take a puff. Get rid of all ashtrays and lighters after your last cigarette. Clean your house and your clothes so that they do not smell of smoke.  ?? Learn how to be a nonsmoker. Think about ways you can avoid those things that make you reach for a cigarette.  ?? Avoid situations that put you at greatest risk for smoking. For some people, it is hard to have a drink with friends without smoking. For others, they might skip a coffee break with coworkers who smoke.  ?? Change your daily routine. Take a different route to work or eat a meal in a different place.  ?? Cut down on stress. Calm yourself or release tension by doing an activity you enjoy, such as reading a book, taking a hot bath, or gardening.  ?? Talk to your doctor or pharmacist about nicotine replacement therapy, which replaces the nicotine in your body. You still get nicotine but you do not use tobacco. Nicotine replacement products help you slowly reduce the amount of nicotine you need. These products come in several forms, many of them available over-the-counter:  ?? Nicotine patches  ?? Nicotine gum and lozenges  ?? Nicotine inhaler  ?? Ask your doctor about bupropion (Wellbutrin) or varenicline (Chantix), which are prescription medicines. They do not contain nicotine. They help you by reducing withdrawal symptoms, such as stress and anxiety.  ?? Some people find hypnosis, acupuncture, and massage helpful for ending the smoking habit.  ?? Eat a healthy diet and get regular exercise. Having healthy habits will help your body move past its craving for nicotine.  ?? Be prepared to keep trying. Most people are not successful the first few times they try to quit. Do not get mad at yourself if you smoke again. Make a list of things you learned and think about when you want to try again, such as next week, next month, or next year.  Where can you learn more?   Go to [employer].  Enter [phone] in the search box to learn more about "Stopping Smoking: Care Instructions."  Current as of: Jan 27, 2015  Content Version: 11.1  ?? 2006-2016 [employer], [employer]. Care instructions adapted under license by [employer] (which disclaims liability or warranty for this information). If you have questions about a medical condition or this instruction, always ask your healthcare professional. [employer] disclaims any warranty or liability for your use of this information.

## 2015-11-03 ENCOUNTER — Institutional Professional Consult (permissible substitution): Admit: 2015-11-03 | Payer: PRIVATE HEALTH INSURANCE | Primary: Physician Assistant

## 2015-11-03 DIAGNOSIS — Z1211 Encounter for screening for malignant neoplasm of colon: Secondary | ICD-10-CM

## 2015-11-03 LAB — AMB POC URINALYSIS DIP STICK MANUAL W/O MICRO
Glucose (UA POC): NEGATIVE
Ketones (UA POC): NEGATIVE
Nitrites (UA POC): NEGATIVE
Specific gravity (UA POC): 1.02 (ref 1.001–1.035)
Urobilinogen (UA POC): 0.2 (ref 0.2–1)
pH (UA POC): 5.5 (ref 4.6–8.0)

## 2015-11-03 NOTE — Progress Notes (Signed)
Able to add culture with reflex?  May notify pt with other labs (still pending), urine has trace blood/leuks/protein in urine.  Microalb pending also.

## 2015-11-03 NOTE — Progress Notes (Signed)
Patient notified about urine via voice mail

## 2015-11-03 NOTE — Progress Notes (Signed)
Patient in office for future labs only, venipuncture (L) AC, patient tolerated procedure well.  Greig Altergott C Maleni Seyer, RN

## 2015-11-03 NOTE — Progress Notes (Signed)
[email or web address]

## 2015-11-03 NOTE — Progress Notes (Signed)
Unable to to do culture with reflex needs to be in the proper containers and was just sent in a sterile container

## 2015-11-04 LAB — METABOLIC PANEL, COMPREHENSIVE
A-G Ratio: 2.3 (ref 1.1–2.5)
ALT (SGPT): 18 IU/L (ref 0–32)
AST (SGOT): 13 IU/L (ref 0–40)
Albumin: 4.9 g/dL — ABNORMAL HIGH (ref 3.6–4.8)
Alk. phosphatase: 75 IU/L (ref 39–117)
BUN/Creatinine ratio: 18 (ref 11–26)
BUN: 13 mg/dL (ref 8–27)
Bilirubin, total: 0.4 mg/dL (ref 0.0–1.2)
CO2: 21 mmol/L (ref 18–29)
Calcium: 9.5 mg/dL (ref 8.7–10.3)
Chloride: 96 mmol/L (ref 96–106)
Creatinine: 0.73 mg/dL (ref 0.57–1.00)
GFR est AA: 101 mL/min/{1.73_m2} (ref >59)
GFR est non-AA: 88 mL/min/{1.73_m2} (ref >59)
GLOBULIN, TOTAL: 2.1 g/dL (ref 1.5–4.5)
Glucose: 175 mg/dL — ABNORMAL HIGH (ref 65–99)
Potassium: 3.6 mmol/L (ref 3.5–5.2)
Protein, total: 7 g/dL (ref 6.0–8.5)
Sodium: 137 mmol/L (ref 134–144)

## 2015-11-04 LAB — LIPID PANEL
Cholesterol, total: 119 mg/dL (ref 100–199)
HDL Cholesterol: 30 mg/dL — ABNORMAL LOW (ref >39)
LDL, calculated: 40 mg/dL (ref 0–99)
Triglyceride: 244 mg/dL — ABNORMAL HIGH (ref 0–149)
VLDL, calculated: 49 mg/dL — ABNORMAL HIGH (ref 5–40)

## 2015-11-04 LAB — MICROALBUMIN, UR, RAND W/ MICROALB/CREAT RATIO
Creatinine, urine random: 153.8 mg/dL
Microalb/Creat ratio (ug/mg creat.): 142.3 mg/g creat — ABNORMAL HIGH (ref 0.0–30.0)
Microalbumin, urine: 218.8 ug/mL

## 2015-11-04 LAB — CBC WITH AUTOMATED DIFF
ABS. BASOPHILS: 0.2 10*3/uL (ref 0.0–0.2)
ABS. EOSINOPHILS: 0.2 10*3/uL (ref 0.0–0.4)
ABS. IMM. GRANS.: 0.1 10*3/uL (ref 0.0–0.1)
ABS. MONOCYTES: 0.9 10*3/uL (ref 0.1–0.9)
ABS. NEUTROPHILS: 3.5 10*3/uL (ref 1.4–7.0)
Abs Lymphocytes: 1.8 10*3/uL (ref 0.7–3.1)
BASOPHILS: 3 %
EOSINOPHILS: 3 %
HCT: 40 % (ref 34.0–46.6)
HGB: 14.3 g/dL (ref 11.1–15.9)
IMMATURE GRANULOCYTES: 2 %
Lymphocytes: 27 %
MCH: 32.1 pg (ref 26.6–33.0)
MCHC: 35.8 g/dL — ABNORMAL HIGH (ref 31.5–35.7)
MCV: 90 fL (ref 79–97)
MONOCYTES: 13 %
NEUTROPHILS: 52 %
PLATELET: 265 10*3/uL (ref 150–379)
RBC: 4.45 x10E6/uL (ref 3.77–5.28)
RDW: 13.7 % (ref 12.3–15.4)
WBC: 6.7 10*3/uL (ref 3.4–10.8)

## 2015-11-04 LAB — HEMOGLOBIN A1C WITH EAG
Estimated average glucose: 166 mg/dL
Hemoglobin A1c: 7.4 % — ABNORMAL HIGH (ref 4.8–5.6)

## 2015-11-04 LAB — CVD REPORT

## 2015-11-04 LAB — TSH 3RD GENERATION: TSH: 1.99 u[IU]/mL (ref 0.450–4.500)

## 2015-11-04 NOTE — Progress Notes (Signed)
Notify pt, her A1c has improved slightly but should cont to work on diet to get <7.   (If unable to achieve with diet may need to increase Metformin to max daily dosage.)   Some protein in urine, need strict control of DM and HTN.  Other kidney and liver tests okay. CBC and thyroid wnl.  Trig have improved significantly since last lab 10748yr ago, however, still elevated.  Rec avoid foods high in trans/sat fats and cont Omega 3 fish oil.  May discuss further at f/u.

## 2015-11-08 NOTE — Progress Notes (Signed)
Patient called by phone and given current lab results.  Safiatou Islam L Shakila Mak, LPN

## 2016-01-31 ENCOUNTER — Inpatient Hospital Stay: Admit: 2016-01-31 | Payer: BLUE CROSS/BLUE SHIELD | Primary: Physician Assistant

## 2016-01-31 ENCOUNTER — Ambulatory Visit
Admit: 2016-01-31 | Discharge: 2016-01-31 | Payer: PRIVATE HEALTH INSURANCE | Attending: Physician Assistant | Primary: Physician Assistant

## 2016-01-31 ENCOUNTER — Encounter: Attending: Physician Assistant | Primary: Physician Assistant

## 2016-01-31 DIAGNOSIS — M189 Osteoarthritis of first carpometacarpal joint, unspecified: Secondary | ICD-10-CM

## 2016-01-31 DIAGNOSIS — M79645 Pain in left finger(s): Secondary | ICD-10-CM

## 2016-01-31 MED ORDER — INDOMETHACIN 50 MG CAP
50 mg | ORAL_CAPSULE | Freq: Three times a day (TID) | ORAL | 0 refills | Status: DC
Start: 2016-01-31 — End: 2017-05-08

## 2016-01-31 NOTE — Progress Notes (Signed)
Madison Peters is a 65 y.o. female who presents to the office today with the following:  Chief Complaint   Patient presents with   ??? Joint Pain     left thumb       HPI  Left thumb started bothering her Friday night.  Has never had before but believes probably has arthritis.  Denies any injuries or precipitating activities.  Says she has been doing her usual ADls.  Pain worse with full flexion and full extension.  Most comfortable in flexion.  Does note intermittent swelling.  Now joint "popping" when goes from flex to ext.  Taking 2 Naproxen q10hrs.  Also applying heat.  Denies any n/t/w. No skin abnls.    Does not think she wants to continue PAP smears.  Denies any hx of abnls, says she was always compliant with screenings.   Last mammogram almost 2 years ago.   Last time with [facility]. Usually set up for her.  Would like [facility].  Will make f/u apt for next week, due for labs and needs refills.  Otherwise feeling well with no other complaints or acute concerns.    Review of Systems   Constitutional: Negative for chills, fever and malaise/fatigue.   Skin: Negative for rash.     See HPI.    Allergies   Allergen Reactions   ??? Ace Inhibitors Cough   ??? Erythromycin Unknown (comments)   ??? Sulfa (Sulfonamide Antibiotics) Nausea and Vomiting       Current Outpatient Prescriptions   Medication Sig   ??? FLUoxetine (PROZAC) 20 mg capsule Take 20 mg by mouth two (2) times a day.   ??? ascorbic acid, vitamin C, (VITAMIN C) 500 mg tablet Take  by mouth.   ??? atorvastatin (LIPITOR) 40 mg tablet Take  by mouth daily.   ??? indomethacin (INDOCIN) 50 mg capsule Take 1 Cap by mouth three (3) times daily.   ??? metFORMIN (GLUCOPHAGE) 500 mg tablet Takes 2 in AM and 1 at NIGHT   ??? amLODIPine (NORVASC) 10 mg tablet TAKE 1 TABLET BY MOUTH EVERY DAY   ??? levothyroxine (SYNTHROID) 50 mcg tablet TAKE 1 TABLET BY MOUTH EVERY DAY   ??? hydroCHLOROthiazide (HYDRODIURIL) 25 mg tablet TAKE 1 TABLET BY MOUTH EVERY DAY    ??? metoprolol succinate (TOPROL-XL) 50 mg XL tablet TAKE 1 TABLET BY MOUTH EVERY DAY   ??? vitamin E (AQUA GEMS) 400 unit capsule Take 1,000 Units by mouth daily.   ??? multivitamin with iron (DAILY MULTI-VITAMINS/IRON) tablet Take 1 Tab by mouth daily.   ??? Cholecalciferol, Vitamin D3, (VITAMIN D3) 1,000 unit cap Take 5,000 Units by mouth daily.   ??? omega-3 fatty acids-vitamin e (FISH OIL) 1,000 mg cap Take 1 Cap by mouth four (4) times daily.   ??? acetaminophen (TYLENOL) 500 mg tablet Take  by mouth every six (6) hours as needed for Pain.   ??? SODIUM CHLORIDE (SALINE NASAL NA) by Nasal route. 2 sprays each nostril every 12 hours.   ??? cyanocobalamin 1,000 mcg tablet Take  by mouth daily.     No current facility-administered medications for this visit.        Past Medical History:   Diagnosis Date   ??? Anxiety    ??? Diabetes (HCC)    ??? Dyspepsia    ??? Emphysema    ??? Headache    ??? Hypertension    ??? IGT (impaired glucose tolerance)    ??? Kidney stones    ??? Lipoma  Left calf   ??? Thyroid disease     hypothryoidism       Past Surgical History:   Procedure Laterality Date   ??? HX HEENT      Tonsillectomy at age 65       Social History     Social History   ??? Marital status: MARRIED     Spouse name: N/A   ??? Number of children: N/A   ??? Years of education: N/A     Social History Main Topics   ??? Smoking status: Current Every Day Smoker   ??? Smokeless tobacco: Never Used   ??? Alcohol use No   ??? Drug use: No   ??? Sexual activity: Yes     Partners: Male     Other Topics Concern   ??? None     Social History Narrative       Family History   Problem Relation Age of Onset   ??? Heart Disease Mother    ??? Other Father      AAA         Physical Exam:  Visit Vitals   ??? BP 108/60 (BP 1 Location: Right arm, BP Patient Position: Sitting)   ??? Pulse 88   ??? Temp 98.4 ??F (36.9 ??C) (Oral)   ??? Resp 16   ??? Ht 5\' 6"  (1.676 m)   ??? Wt 175 lb 9.6 oz (79.7 kg)   ??? SpO2 94%   ??? BMI 28.34 kg/m2     Physical Exam    Constitutional: She is oriented to person, place, and time and well-developed, well-nourished, and in no distress.   HENT:   Head: Normocephalic and atraumatic.   Eyes: Conjunctivae are normal.   Neck: Normal range of motion. Neck supple.   Cardiovascular: Normal rate and regular rhythm.    Pulmonary/Chest: Effort normal. She has wheezes (intermittent, hx COPD/smoker).   Abdominal: Soft. There is no tenderness.   Musculoskeletal:        Left hand: She exhibits tenderness and bony tenderness. She exhibits normal range of motion, normal capillary refill, no laceration and no swelling. Normal sensation noted. Normal strength noted.        Hands:  Lymphadenopathy:     She has no cervical adenopathy.   Neurological: She is alert and oriented to person, place, and time. Gait normal.   Skin: Skin is warm and dry.   Psychiatric: Mood and affect normal.   Nursing note and vitals reviewed.    Assessment/Plan:    ICD-10-CM ICD-9-CM    1. Thumb pain, left M79.645 729.5 XR THUMB LT MIN 2 V      indomethacin (INDOCIN) 50 mg capsule  Counseled pt on potential medication AEs/interactions. Take with meals. D/c other nsaids.   2. Thumb joint locking M24.849 718.94    3. Thumb joint stiffness M25.649 719.54    4. Encounter for screening mammogram for malignant neoplasm of breast Z12.31 V76.12 MAM MAMMO BI SCREENING INCL CAD     Pt will return next week for routine check-up and labs/refills.  In meantime counseled on medication, rec w/ Tylenol. Apply ice. Notify if any new/worsening sxs.  Pending XR- determines f/u. Discussed Ortho referral.    Follow-up Disposition:  Return in about 1 week (around 02/07/2016).    Marlan PalauSarah S. Jenkins, PA-C

## 2016-01-31 NOTE — Progress Notes (Signed)
LMFCB

## 2016-01-31 NOTE — Patient Instructions (Signed)
Hand Pain: Care Instructions  Your Care Instructions  Common causes of hand pain are overuse and injuries, such as might happen during sports or home repair projects. Everyday wear and tear, especially as you get older, also can cause hand pain.  Most minor hand injuries will heal on their own, and home treatment is usually all you need to do. If you have sudden and severe pain, you may need tests and treatment.  Follow-up care is a key part of your treatment and safety. Be sure to make and go to all appointments, and call your doctor if you are having problems. It???s also a good idea to know your test results and keep a list of the medicines you take.  How can you care for yourself at home?  ?? Take pain medicines exactly as directed.  ?? If the doctor gave you a prescription medicine for pain, take it as prescribed.  ?? If you are not taking a prescription pain medicine, ask your doctor if you can take an over-the-counter medicine.  ?? Rest and protect your hand. Take a break from any activity that may cause pain.  ?? Put ice or a cold pack on your hand for 10 to 20 minutes at a time. Put a thin cloth between the ice and your skin.  ?? Prop up the sore hand on a pillow when you ice it or anytime you sit or lie down during the next 3 days. Try to keep it above the level of your heart. This will help reduce swelling.  ?? If your doctor recommends a sling, splint, or elastic bandage to support your hand, wear it as directed.  When should you call for help?  Call 911 anytime you think you may need emergency care. For example, call if:  ?? Your hand turns cool or pale or changes color.  Call your doctor now or seek immediate medical care if:  ?? You cannot move your hand.  ?? Your hand pops, moves out of its normal position, and then returns to its normal position.  ?? You have signs of infection, such as:  ?? Increased pain, swelling, warmth, or redness.  ?? Red streaks leading from the sore area.   ?? Pus draining from a place on your hand.  ?? A fever.  ?? Your hand feels numb or tingly.  Watch closely for changes in your health, and be sure to contact your doctor if:  ?? Your hand feels unstable when you try to use it.  ?? You do not get better as expected.  ?? You have any new symptoms, such as swelling.  ?? Bruises from an injury to your hand last longer than 2 weeks.  Where can you learn more?  Go to http://www.healthwise.net/[employer].  Enter R273 in the search box to learn more about "Hand Pain: Care Instructions."  Current as of: Jan 28, 2015  Content Version: 11.2  ?? 2006-2017 [employer]. Care instructions adapted under license by [employer] (which disclaims liability or warranty for this information). If you have questions about a medical condition or this instruction, always ask your healthcare professional. [employer]wise[employer] Inc[employer] disclaims any warranty or liability for your use of this information.

## 2016-01-31 NOTE — Progress Notes (Signed)
Notified of xray results.

## 2016-01-31 NOTE — Progress Notes (Signed)
Notify pt imaging confirms moderate degenerative changes, consistent with arthritis. Rec continue tx as planned. Will see her at her f/u and to notify if any new/worsening sxs in meantime as instructed.

## 2016-01-31 NOTE — Progress Notes (Signed)
Patient notified about xrays

## 2016-02-07 ENCOUNTER — Ambulatory Visit
Admit: 2016-02-07 | Discharge: 2016-02-07 | Payer: PRIVATE HEALTH INSURANCE | Attending: Physician Assistant | Primary: Physician Assistant

## 2016-02-07 DIAGNOSIS — E119 Type 2 diabetes mellitus without complications: Secondary | ICD-10-CM

## 2016-02-07 LAB — AMB POC URINALYSIS DIP STICK MANUAL W/O MICRO
Glucose (UA POC): NEGATIVE
Ketones (UA POC): NEGATIVE
Leukocyte esterase (UA POC): NEGATIVE
Nitrites (UA POC): POSITIVE
Specific gravity (UA POC): 1.025 (ref 1.001–1.035)
Urobilinogen (UA POC): 0.2 (ref 0.2–1)
pH (UA POC): 5.5 (ref 4.6–8.0)

## 2016-02-07 MED ORDER — AMOXICILLIN CLAVULANATE 875 MG-125 MG TAB
875-125 mg | ORAL_TABLET | Freq: Two times a day (BID) | ORAL | 0 refills | Status: AC
Start: 2016-02-07 — End: 2016-02-14

## 2016-02-07 MED ORDER — ATORVASTATIN 40 MG TAB
40 mg | ORAL_TABLET | Freq: Every day | ORAL | 2 refills | Status: DC
Start: 2016-02-07 — End: 2017-02-09

## 2016-02-07 MED ORDER — HYDROCHLOROTHIAZIDE 25 MG TAB
25 mg | ORAL_TABLET | ORAL | 2 refills | Status: DC
Start: 2016-02-07 — End: 2017-02-09

## 2016-02-07 MED ORDER — ALBUTEROL SULFATE HFA 90 MCG/ACTUATION AEROSOL INHALER
90 mcg/actuation | Freq: Four times a day (QID) | RESPIRATORY_TRACT | 0 refills | Status: DC | PRN
Start: 2016-02-07 — End: 2016-12-10

## 2016-02-07 MED ORDER — LEVOTHYROXINE 50 MCG TAB
50 mcg | ORAL_TABLET | ORAL | 2 refills | Status: DC
Start: 2016-02-07 — End: 2017-02-09

## 2016-02-07 MED ORDER — FLUOXETINE 20 MG CAP
20 mg | ORAL_CAPSULE | Freq: Two times a day (BID) | ORAL | 2 refills | Status: DC
Start: 2016-02-07 — End: 2016-05-16

## 2016-02-07 MED ORDER — AMLODIPINE 10 MG TAB
10 mg | ORAL_TABLET | ORAL | 2 refills | Status: DC
Start: 2016-02-07 — End: 2017-02-09

## 2016-02-07 MED ORDER — METFORMIN 500 MG TAB
500 mg | ORAL_TABLET | ORAL | 2 refills | Status: DC
Start: 2016-02-07 — End: 2016-05-16

## 2016-02-07 MED ORDER — METOPROLOL SUCCINATE SR 50 MG 24 HR TAB
50 mg | ORAL_TABLET | ORAL | 2 refills | Status: DC
Start: 2016-02-07 — End: 2017-02-09

## 2016-02-07 NOTE — Progress Notes (Signed)
Madison Peters is a 65 y.o. female who presents to the office today with the following:  Chief Complaint   Patient presents with   ??? Follow-up     thumb arthritis       HPI  Pt originally had f/u today for thumb.  Says improved significantly, no longer concerned.  Not getting stuck as often, improving.    Wants to be seen for new URI sxs today instead.  Started feeling sick late last night/this morning.  She c/o sore throat since this morning.   Ears hurting, L>R.  Coughed all night, no sleep.  Bringing up yellow phlegm.  A lot of congestion, blowing nose a lot.  Still smoking. Hx emphysema, has not req inhalers in years.  No known sick contacts.  Has tried some otc cough syrup.    Also due for A1c and needs all medications refilled.  Known HTN, Hyperlipidemia, NID-DM, thyroid dz, and anxiety.  Reports doing well on all medications, tolerates w/o SEs.   No recent HAs, CP, (new) SOB, or TIA sxs. Also no myalgias, new jt pain.    Lab Results   Component Value Date/Time    Hemoglobin A1c 7.4 11/03/2015 08:02 AM   May consider increase in Metformin to max dosage pending today's reading.  Pt BP and anxiety has been well-controlled.  Lab Results   Component Value Date/Time    TSH 1.990 11/03/2015 08:02 AM   Also doing well.    Mammogram scheduled last visit.  Declines PAPs.    Review of Systems   Constitutional: Negative for chills, fever and malaise/fatigue.   HENT: Positive for congestion, ear pain and sore throat.    Respiratory: Positive for cough. Negative for shortness of breath (when coughs feels a little sob but says that is not new with her, is heavy smoker) and wheezing.    Cardiovascular: Negative for chest pain.   Gastrointestinal: Negative for abdominal pain, diarrhea, nausea and vomiting.   Genitourinary: Negative.    Musculoskeletal: Positive for myalgias.   Skin: Negative for rash.   Neurological: Positive for headaches.   Psychiatric/Behavioral: Negative for depression, hallucinations, memory  loss, substance abuse and suicidal ideas. The patient is not nervous/anxious (hx anxiety, well-controlled on med) and does not have insomnia.      See HPI.    Allergies   Allergen Reactions   ??? Ace Inhibitors Cough   ??? Erythromycin Unknown (comments)   ??? Sulfa (Sulfonamide Antibiotics) Nausea and Vomiting       Current Outpatient Prescriptions   Medication Sig   ??? FLUoxetine (PROZAC) 20 mg capsule Take 1 Cap by mouth two (2) times a day.   ??? atorvastatin (LIPITOR) 40 mg tablet Take 1 Tab by mouth daily.   ??? metFORMIN (GLUCOPHAGE) 500 mg tablet Takes 2 in AM and 1 at NIGHT   ??? amLODIPine (NORVASC) 10 mg tablet TAKE 1 TABLET BY MOUTH EVERY DAY   ??? levothyroxine (SYNTHROID) 50 mcg tablet TAKE 1 TABLET BY MOUTH EVERY DAY   ??? hydroCHLOROthiazide (HYDRODIURIL) 25 mg tablet TAKE 1 TABLET BY MOUTH EVERY DAY   ??? metoprolol succinate (TOPROL-XL) 50 mg XL tablet TAKE 1 TABLET BY MOUTH EVERY DAY   ??? albuterol (PROVENTIL HFA, VENTOLIN HFA, PROAIR HFA) 90 mcg/actuation inhaler Take 1 Puff by inhalation every six (6) hours as needed for Wheezing. Indications: BRONCHOSPASM PREVENTION   ??? amoxicillin-clavulanate (AUGMENTIN) 875-125 mg per tablet Take 1 Tab by mouth every twelve (12) hours for 7 days.   ???  ascorbic acid, vitamin C, (VITAMIN C) 500 mg tablet Take  by mouth.   ??? indomethacin (INDOCIN) 50 mg capsule Take 1 Cap by mouth three (3) times daily.   ??? vitamin E (AQUA GEMS) 400 unit capsule Take 1,000 Units by mouth daily.   ??? acetaminophen (TYLENOL) 500 mg tablet Take  by mouth every six (6) hours as needed for Pain.   ??? SODIUM CHLORIDE (SALINE NASAL NA) by Nasal route. 2 sprays each nostril every 12 hours.   ??? multivitamin with iron (DAILY MULTI-VITAMINS/IRON) tablet Take 1 Tab by mouth daily.   ??? Cholecalciferol, Vitamin D3, (VITAMIN D3) 1,000 unit cap Take 5,000 Units by mouth daily.   ??? cyanocobalamin 1,000 mcg tablet Take  by mouth daily.   ??? omega-3 fatty acids-vitamin e (FISH OIL) 1,000 mg cap Take 1 Cap by  mouth four (4) times daily.     No current facility-administered medications for this visit.        Past Medical History:   Diagnosis Date   ??? Anxiety    ??? Diabetes (HCC)    ??? Dyspepsia    ??? Emphysema    ??? Headache    ??? Hypertension    ??? IGT (impaired glucose tolerance)    ??? Kidney stones    ??? Lipoma     Left calf   ??? Thyroid disease     hypothryoidism       Past Surgical History:   Procedure Laterality Date   ??? HX HEENT      Tonsillectomy at age 65       Social History     Social History   ??? Marital status: MARRIED     Spouse name: N/A   ??? Number of children: N/A   ??? Years of education: N/A     Social History Main Topics   ??? Smoking status: Current Every Day Smoker   ??? Smokeless tobacco: Never Used   ??? Alcohol use No   ??? Drug use: No   ??? Sexual activity: Yes     Partners: Male     Other Topics Concern   ??? None     Social History Narrative       Family History   Problem Relation Age of Onset   ??? Heart Disease Mother    ??? Other Father      AAA         Physical Exam:  Visit Vitals   ??? BP 113/66 (BP 1 Location: Right arm, BP Patient Position: Sitting)   ??? Pulse 83   ??? Temp 97.3 ??F (36.3 ??C) (Oral)   ??? Ht 5\' 6"  (1.676 m)   ??? Wt 177 lb (80.3 kg)   ??? SpO2 93%   ??? BMI 28.57 kg/m2     Physical Exam   Constitutional: She is oriented to person, place, and time and well-developed, well-nourished, and in no distress.   HENT:   Head: Normocephalic and atraumatic.   Right Ear: Tympanic membrane, external ear and ear canal normal.   Left Ear: External ear normal.   Nose: Mucosal edema (mild, nares patent, trace yellow mucus) present. Right sinus exhibits no maxillary sinus tenderness and no frontal sinus tenderness. Left sinus exhibits no maxillary sinus tenderness and no frontal sinus tenderness.   Mouth/Throat: Uvula is midline and mucous membranes are normal. Posterior oropharyngeal erythema (mild injection noted along pillars, no diffuse erythema or other abnls) present. No oropharyngeal exudate, posterior  oropharyngeal edema or tonsillar abscesses.  Cerumen impaction left ear canal. S/p irrigation canal/TM unremarkable. Pt tolerated w/o difficulty, feels much better, sxs resolved and hearing is intact bilat.    Eyes: Conjunctivae and EOM are normal.   Neck: Normal range of motion. Neck supple.   Cardiovascular: Normal rate and regular rhythm.    Pulmonary/Chest: Effort normal. No respiratory distress. She has wheezes (diffuse, difficult exam due to pt unable to take breath w/o cough). She has no rhonchi. She has no rales.   Abdominal: Soft. There is no tenderness.   Musculoskeletal: She exhibits no edema.   Lymphadenopathy:     She has no cervical adenopathy.   Neurological: She is alert and oriented to person, place, and time. Gait normal.   Skin: Skin is warm and dry.   Psychiatric: Mood and affect normal.   Nursing note and vitals reviewed.      Assessment/Plan:    ICD-10-CM ICD-9-CM    1. Controlled type 2 diabetes mellitus without complication, without long-term current use of insulin (HCC) E11.9 250.00 HEMOGLOBIN A1C WITH EAG      AMB POC URINALYSIS DIP STICK MANUAL W/O MICRO      metFORMIN (GLUCOPHAGE) 500 mg tablet  - May need to increase pending A1c result. Will call pt.   2. Essential hypertension I10 401.9 Well-controlled. Cont current regimen.  AMB POC URINALYSIS DIP STICK MANUAL W/O MICRO      amLODIPine (NORVASC) 10 mg tablet      hydroCHLOROthiazide (HYDRODIURIL) 25 mg tablet      metoprolol succinate (TOPROL-XL) 50 mg XL tablet   3. Thyroid disease E07.9 246.9 levothyroxine (SYNTHROID) 50 mcg tablet   4. Hyperlipidemia, unspecified hyperlipidemia type E78.5 272.4 atorvastatin (LIPITOR) 40 mg tablet  Reiterated LMs.   5. Anxiety F41.9 300.00 FLUoxetine (PROZAC) 20 mg capsule   6. Impacted cerumen of left ear H61.22 380.4 REMOVAL IMPACTED CERUMEN IRRIGATION/LVG UNILAT   7. Wheeze R06.2 786.07 albuterol (PROVENTIL HFA, VENTOLIN HFA, PROAIR HFA) 90 mcg/actuation inhaler   - Declines tx in office. Agrees to inhaler at home.    8. Abnormal urine findings R82.90 791.9 URINALYSIS W/ RFLX MICROSCOPIC  Denies sxs. Need to f/u on trace blood.   9. URI with cough and congestion J06.9 465.9 amoxicillin-clavulanate (AUGMENTIN) 875-125 mg per tablet  - Explained still early, likely viral. Will take/rtc if sxs persist or worsen.  Encourage rest & fluids.  Discussed otc medications for symptomatic relief.  RTO if sxs persist/worsen or develops any additional sxs/concerns.  Seek immediate care/to ER for any "red flag" sxs.       Health Maintenance Due   Topic Date Due   ??? EYE EXAM RETINAL OR DILATED Q1  04/20/1962   ??? Pneumococcal 19-64 Medium Risk (1 of 1 - PPSV23) 04/21/1971   ??? BREAST CANCER SCRN MAMMOGRAM  04/20/2002   ??? FOBT Q 1 YEAR AGE 65-75  04/20/2002   **Need to discuss further with pt. Discuss with lab result.    Follow-up Disposition:  Return in about 3 months (around 05/09/2016), or sooner if symptoms worsen or fail to improve.    Marlan Palau, PA-C

## 2016-02-07 NOTE — Patient Instructions (Addendum)
Earwax Blockage: Care Instructions  Your Care Instructions    Earwax is a natural substance that protects the ear canal. Normally, earwax drains from the ears and does not cause problems. Sometimes earwax builds up and hardens. Earwax blockage (also called cerumen impaction) can cause some loss of hearing and pain. When wax is tightly packed, you will need to have your doctor remove it.  Follow-up care is a key part of your treatment and safety. Be sure to make and go to all appointments, and call your doctor if you are having problems. It???s also a good idea to know your test results and keep a list of the medicines you take.  How can you care for yourself at home?  ?? Do not try to remove earwax with cotton swabs, fingers, or other objects. This can make the blockage worse and damage the eardrum.  ?? If your doctor recommends that you try to remove earwax at home:  ?? Soften and loosen the earwax with warm mineral oil. You also can try hydrogen peroxide mixed with an equal amount of room temperature water. Place 2 drops of the fluid, warmed to body temperature, in the ear two times a day for up to 5 days.  ?? Once the wax is loose and soft, all that is usually needed to remove it from the ear canal is a gentle, warm shower. Direct the water into the ear, then tip your head to let the earwax drain out. Dry your ear thoroughly with a hair dryer set on low. Hold the dryer several inches from your ear.  ?? If the warm mineral oil and shower do not work, use an over-the-counter wax softener followed by gentle flushing with an ear syringe each night for a week or two. Make sure the flushing solution is body temperature. Cool or hot fluids in the ear can cause dizziness.  When should you call for help?  Call your doctor now or seek immediate medical care if:  ?? Pus or blood drains from your ear.  ?? Your ears are ringing or feel full.  ?? You have a loss of hearing.   Watch closely for changes in your health, and be sure to contact your doctor if:  ?? You have pain or reduced hearing after 1 week of home treatment.  ?? You have any new symptoms, such as nausea or balance problems.  Where can you learn more?  Go to [employer].  Enter [phone] in the search box to learn more about "Earwax Blockage: Care Instructions."  Current as of: Jan 28, 2015  Content Version: 11.2  ?? 2006-2017 [employer]. Care instructions adapted under license by [employer] (which disclaims liability or warranty for this information). If you have questions about a medical condition or this instruction, always ask your healthcare professional. [employer] disclaims any warranty or liability for your use of this information.       Wheezing or Bronchoconstriction: Care Instructions  Your Care Instructions  Wheezing is a whistling noise made during breathing. It occurs when the small airways, or bronchial tubes, that lead to your lungs swell or contract (spasm) and become narrow. This narrowing is called bronchoconstriction. When your airways constrict, it is hard for air to pass through and this makes it hard for you to breathe.  Wheezing and bronchoconstriction can be caused by many problems, including:  ?? An infection such as the flu or a cold.  ?? Allergies such as hay fever.  ?? Diseases  such as asthma or chronic obstructive pulmonary disease.  ?? Smoking.  Treatment for your wheezing depends on what is causing the problem. Your wheezing may get better without treatment. But you may need to pay attention to things that cause your wheezing and avoid them. Or you may need medicine to help treat the wheezing and to reduce the swelling or to relieve spasms in your lungs.  Follow-up care is a key part of your treatment and safety. Be sure to make and go to all appointments, and call your doctor if you are having  problems. It is also a good idea to know your test results and keep a list of the medicines you take.  How can you care for yourself at home?  ?? Take your medicine exactly as prescribed. Call your doctor if you think you are having a problem with your medicine. You will get more details on the specific medicine your doctor prescribes.  ?? If your doctor prescribed antibiotics, take them as directed. Do not stop taking them just because you feel better. You need to take the full course of antibiotics.  ?? Breathe moist air from a humidifier, hot shower, or sink filled with hot water. This may help ease your symptoms and make it easier for you to breathe.  ?? If you have congestion in your nose and throat, drinking plenty of fluids, especially hot fluids, may help relieve your symptoms. If you have kidney, heart, or liver disease and have to limit fluids, talk with your doctor before you increase the amount of fluids you drink.  ?? If you have mucus in your airways, it may help to breathe deeply and cough.  ?? Do not smoke or allow others to smoke around you. Smoking can make your wheezing worse. If you need help quitting, talk to your doctor about stop-smoking programs and medicines. These can increase your chances of quitting for good.  ?? Avoid things that may cause your wheezing. These may include colds, smoke, air pollution, dust, pollen, pets, cockroaches, stress, and cold air.  When should you call for help?  Call 911 anytime you think you may need emergency care. For example, call if:  ?? You have severe trouble breathing.  ?? You passed out (lost consciousness).  Call your doctor now or seek immediate medical care if:  ?? You cough up yellow, dark brown, or bloody mucus (sputum).  ?? You have new or worse shortness of breath.  ?? Your wheezing is not getting better or it gets worse after you start taking your medicine.  Watch closely for changes in your health, and be sure to contact your doctor if:   ?? You do not get better as expected.  Where can you learn more?  Go to [employer]www.[employer]wise.net[employer].  Enter V454 in the search box to learn more about "Wheezing or Bronchoconstriction: Care Instructions."  Current as of: Jan 24, 2015  Content Version: 11.2  ?? 2006-2017 [employer]. Care instructions adapted under license by [employer] (which disclaims liability or warranty for this information). If you have questions about a medical condition or this instruction, always ask your healthcare professional. [employer] disclaims any warranty or liability for your use of this information.       Upper Respiratory Infection (Cold): Care Instructions  Your Care Instructions    An upper respiratory infection, or URI, is an infection of the nose, sinuses, or throat. URIs are spread by coughs, sneezes, and direct contact. The common cold  is the most frequent kind of URI. The flu and sinus infections are other kinds of URIs.  Almost all URIs are caused by viruses. Antibiotics won't cure them. But you can treat most infections with home care. This may include drinking lots of fluids and taking over-the-counter pain medicine. You will probably feel better in 4 to 10 days.  The doctor has checked you carefully, but problems can develop later. If you notice any problems or new symptoms, get medical treatment right away.  Follow-up care is a key part of your treatment and safety. Be sure to make and go to all appointments, and call your doctor if you are having problems. It's also a good idea to know your test results and keep a list of the medicines you take.  How can you care for yourself at home?  ?? To prevent dehydration, drink plenty of fluids, enough so that your urine is light yellow or clear like water. Choose water and other caffeine-free clear liquids until you feel better. If you have kidney, heart, or liver disease and have to limit fluids, talk with your doctor  before you increase the amount of fluids you drink.  ?? Take an over-the-counter pain medicine, such as acetaminophen (Tylenol), ibuprofen (Advil, Motrin), or naproxen (Aleve). Read and follow all instructions on the label.  ?? Before you use cough and cold medicines, check the label. These medicines may not be safe for young children or for people with certain health problems.  ?? Be careful when taking over-the-counter cold or flu medicines and Tylenol at the same time. Many of these medicines have acetaminophen, which is Tylenol. Read the labels to make sure that you are not taking more than the recommended dose. Too much acetaminophen (Tylenol) can be harmful.  ?? Get plenty of rest.  ?? Do not smoke or allow others to smoke around you. If you need help quitting, talk to your doctor about stop-smoking programs and medicines. These can increase your chances of quitting for good.  When should you call for help?  Call 911 anytime you think you may need emergency care. For example, call if:  ?? You have severe trouble breathing.  Call your doctor now or seek immediate medical care if:  ?? You seem to be getting much sicker.  ?? You have new or worse trouble breathing.  ?? You have a new or higher fever.  ?? You have a new rash.  Watch closely for changes in your health, and be sure to contact your doctor if:  ?? You have a new symptom, such as a sore throat, an earache, or sinus pain.  ?? You cough more deeply or more often, especially if you notice more mucus or a change in the color of your mucus.  ?? You do not get better as expected.  Where can you learn more?  Go to [employer].  Enter [phone] in the search box to learn more about "Upper Respiratory Infection (Cold): Care Instructions."  Current as of: March 03, 2015  Content Version: 11.2  ?? 2006-2017 [employer]. Care instructions adapted under license by [employer] (which disclaims liability or warranty  for this information). If you have questions about a medical condition or this instruction, always ask your healthcare professional. [employer] disclaims any warranty or liability for your use of this information.

## 2016-02-07 NOTE — Progress Notes (Signed)
Discussed in visit. Admits to dehydration but no GU sxs. Will send for reflex.

## 2016-02-08 LAB — HEMOGLOBIN A1C WITH EAG
Estimated average glucose: 194 mg/dL
Hemoglobin A1c: 8.4 % — ABNORMAL HIGH (ref 4.8–5.6)

## 2016-02-08 LAB — URINALYSIS W/ RFLX MICROSCOPIC
Bilirubin: NEGATIVE
Blood: NEGATIVE
Glucose: NEGATIVE
Ketone: NEGATIVE
Nitrites: NEGATIVE
Specific Gravity: 1.026 (ref 1.005–1.030)
Urobilinogen: 0.2 mg/dL (ref 0.2–1.0)
pH (UA): 6 (ref 5.0–7.5)

## 2016-02-08 LAB — MICROSCOPIC EXAMINATION: Casts: NONE SEEN /lpf

## 2016-02-08 NOTE — Progress Notes (Signed)
Notify pt urine okay.

## 2016-02-08 NOTE — Progress Notes (Signed)
Attempted to contact. [facility].  Please notify pt her A1c has become much worse, now at 8.4 which is poorly controlled.  Recommend increase Metformin to 2 tabs in AM and 2 tabs in PM and really needs to work on diet, avoiding conc sweets, limit carbs/starches.  Repeat in 3 months.    *Also tried to call to discuss with her, I still do not see any medical records on colonoscopy and wanted to discuss with her, if she has not had or is passed due wanted her to come by to pick up a FOBT card (not sure if she may still have one from previous visit). Also if when she was feeling better if she wanted to come by for second pneum vaccine and still need MR for Opthalmology still as well.

## 2016-02-08 NOTE — Telephone Encounter (Signed)
Called and spoke to patient, advised of lab results per SJenkins, PA-C review.  Patient verbalizes understanding.  Madison MccreedyStacy C Jonie Burdell, RN

## 2016-02-08 NOTE — Progress Notes (Signed)
Called and spoke to patient, advised of lab results per SJenkins, PA-C's review.  Patient verbalizes understanding.  Gracelyn Coventry C Meridee Branum, RN

## 2016-02-08 NOTE — Progress Notes (Signed)
Called and spoke to patient, advised of lab results per SJenkins, PA-C's review.  Patient verbalizes understanding.  Gaynel Schaafsma C Mariluz Crespo, RN

## 2016-02-09 ENCOUNTER — Ambulatory Visit: Payer: BLUE CROSS/BLUE SHIELD | Primary: Physician Assistant

## 2016-05-09 ENCOUNTER — Encounter: Attending: Physician Assistant | Primary: Physician Assistant

## 2016-05-15 ENCOUNTER — Ambulatory Visit
Admit: 2016-05-15 | Discharge: 2016-05-15 | Payer: PRIVATE HEALTH INSURANCE | Attending: Physician Assistant | Primary: Physician Assistant

## 2016-05-15 ENCOUNTER — Encounter

## 2016-05-15 DIAGNOSIS — F419 Anxiety disorder, unspecified: Secondary | ICD-10-CM

## 2016-05-15 NOTE — Progress Notes (Signed)
A letter has been sent to patient requesting a return call to discuss this test and when she is bringing in the kit.

## 2016-05-15 NOTE — Patient Instructions (Signed)
Anxiety Disorder: Care Instructions  Your Care Instructions  Anxiety is a normal reaction to stress. Difficult situations can cause you to have symptoms such as sweaty palms and a nervous feeling.  In an anxiety disorder, the symptoms are far more severe. Constant worry, muscle tension, trouble sleeping, nausea and diarrhea, and other symptoms can make normal daily activities difficult or impossible. These symptoms may occur for no reason, and they can affect your work, school, or social life. Medicines, counseling, and self-care can all help.  Follow-up care is a key part of your treatment and safety. Be sure to make and go to all appointments, and call your doctor if you are having problems. It's also a good idea to know your test results and keep a list of the medicines you take.  How can you care for yourself at home?  ?? Take medicines exactly as directed. Call your doctor if you think you are having a problem with your medicine.  ?? Go to your counseling sessions and follow-up appointments.  ?? Recognize and accept your anxiety. Then, when you are in a situation that makes you anxious, say to yourself, "This is not an emergency. I feel uncomfortable, but I am not in danger. I can keep going even if I feel anxious."  ?? Be kind to your body:  ?? Relieve tension with exercise or a massage.  ?? Get enough rest.  ?? Avoid alcohol, caffeine, nicotine, and illegal drugs. They can increase your anxiety level and cause sleep problems.  ?? Learn and do relaxation techniques. See below for more about these techniques.  ?? Engage your mind. Get out and do something you enjoy. Go to a funny movie, or take a walk or hike. Plan your day. Having too much or too little to do can make you anxious.  ?? Keep a record of your symptoms. Discuss your fears with a good friend or family member, or join a support group for people with similar problems. Talking to others sometimes relieves stress.   ?? Get involved in social groups, or volunteer to help others. Being alone sometimes makes things seem worse than they are.  ?? Get at least 30 minutes of exercise on most days of the week to relieve stress. Walking is a good choice. You also may want to do other activities, such as running, swimming, cycling, or playing tennis or team sports.  Relaxation techniques  Do relaxation exercises 10 to 20 minutes a day. You can play soothing, relaxing music while you do them, if you wish.  ?? Tell others in your house that you are going to do your relaxation exercises. Ask them not to disturb you.  ?? Find a comfortable place, away from all distractions and noise.  ?? Lie down on your back, or sit with your back straight.  ?? Focus on your breathing. Make it slow and steady.  ?? Breathe in through your nose. Breathe out through either your nose or mouth.  ?? Breathe deeply, filling up the area between your navel and your rib cage. Breathe so that your belly goes up and down.  ?? Do not hold your breath.  ?? Breathe like this for 5 to 10 minutes. Notice the feeling of calmness throughout your whole body.  As you continue to breathe slowly and deeply, relax by doing the following for another 5 to 10 minutes:  ?? Tighten and relax each muscle group in your body. You can begin at your toes and work your   way up to your head.  ?? Imagine your muscle groups relaxing and becoming heavy.  ?? Empty your mind of all thoughts.  ?? Let yourself relax more and more deeply.  ?? Become aware of the state of calmness that surrounds you.  ?? When your relaxation time is over, you can bring yourself back to alertness by moving your fingers and toes and then your hands and feet and then stretching and moving your entire body. Sometimes people fall asleep during relaxation, but they usually wake up shortly afterward.  ?? Always give yourself time to return to full alertness before you drive a  car or do anything that might cause an accident if you are not fully alert. Never play a relaxation tape while you drive a car.  When should you call for help?  Call 911 anytime you think you may need emergency care. For example, call if:  ?? You feel you cannot stop from hurting yourself or someone else.  Keep the numbers for these national suicide hotlines: 1-800-273-TALK 424-822-7577) and 1-800-SUICIDE 787-123-9864). If you or someone you know talks about suicide or feeling hopeless, get help right away.  Watch closely for changes in your health, and be sure to contact your doctor if:  ?? You have anxiety or fear that affects your life.  ?? You have symptoms of anxiety that are new or different from those you had before.  Where can you learn more?  Go to [employer].  Enter P754 in the search box to learn more about "Anxiety Disorder: Care Instructions."  Current as of: March 29, 2015  Content Version: 11.3  ?? 2006-2017 [employer]. Care instructions adapted under license by [employer] (which disclaims liability or warranty for this information). If you have questions about a medical condition or this instruction, always ask your healthcare professional. [employer] disclaims any warranty or liability for your use of this information.       Noninsulin Medicines for Type 2 Diabetes: Care Instructions  Your Care Instructions  There are different types of noninsulin medicines for diabetes. Each works in a different way. But they all help you control your blood sugar. Some types help your body make insulin to lower your blood sugar. Others lower how much insulin your body needs. Some can slow how fast your body digests sugars. And some can remove extra glucose through your urine.  ?? Alpha-glucosidase inhibitors. These keep starches from breaking down. This means that they lower the amount of glucose absorbed when you eat.  They don't help your body make more insulin. So they will not cause low blood sugar unless you use them with other medicines for diabetes. They include acarbose and miglitol.  ?? DPP-4 inhibitors. These help your body raise the level of insulin after you eat. They also help your body make less of a hormone that raises blood sugar. They include linagliptin, saxagliptin, and sitagliptin.  ?? Incretin hormones (GLP-1 receptor agonists). Your body makes a protein that can raise your insulin level. It also can lower your blood sugar and make you less hungry. You can get shots of hormones that work the same way. They include exenatide and liraglutide.  ?? Meglitinides. These help your body release insulin. They also help slow how your body digests sugars. So they can keep your blood sugar from rising too fast after you eat. They include nateglinide and repaglinide.  ?? Metformin. This lowers how much glucose your liver makes. And it helps you respond better  to insulin. It also lowers the amount of stored sugar that your liver releases when you are not eating.  ?? SGLT2 inhibitors. These help to remove extra glucose through your urine. They may also help some people lose weight. They include canagliflozin, dapagliflozin, and empagliflozin.  ?? Sulfonylureas. These help your body release more insulin. Some work for many hours. They can cause low blood sugar if you don't eat as you planned. They include glipizide and glyburide.  ?? Thiazolidinediones. These reduce the amount of blood glucose. They also help you respond better to insulin. They include pioglitazone and rosiglitazone.  You may need to take more than one medicine for diabetes. Two or more medicines may work better to lower your blood sugar level than just one does.  Follow-up care is a key part of your treatment and safety. Be sure to make and go to all appointments, and call your doctor if you are having  problems. It's also a good idea to know your test results and keep a list of the medicines you take.  How can you care for yourself at home?  ?? Eat a healthy diet. Get some exercise each day. This may help you to reduce how much medicine you need.  ?? Do not take other prescription or over-the-counter medicines, vitamins, herbal products, or supplements without talking to your doctor first. Some medicines for type 2 diabetes can cause problems with other medicines or supplements.  ?? Tell your doctor if you plan to get pregnant. Some of these drugs are not safe for pregnant women.  ?? Be safe with medicines. Take your medicines exactly as prescribed. Meglitinides and sulfonylureas can cause your blood sugar to drop very low. Call your doctor if you think you are having a problem with your medicine.  ?? Check your blood sugar often. You can use a glucose monitor. Keeping track can help you know how certain foods, activities, and medicines affect your blood sugar. And it can help you keep your blood sugar from getting so low that it's not safe.  When should you call for help?  Call 911 anytime you think you may need emergency care. For example, call if:  ?? You passed out (lost consciousness).  ?? You are confused or cannot think clearly.  ?? Your blood sugar is very high or very low.  Watch closely for changes in your health, and be sure to contact your doctor if:  ?? Your blood sugar stays outside the level your doctor set for you.  ?? You have any problems.  Where can you learn more?  Go to [email or web address][email or web address][employer].  Enter H153 in the search box to learn more about "Noninsulin Medicines for Type 2 Diabetes: Care Instructions."  Current as of: November 14, 2015  Content Version: 11.3  ?? 2006-2017 [employer]. Care instructions adapted under license by [employer] (which disclaims liability or warranty for this information). If you have questions about a medical condition or  this instruction, always ask your healthcare professional. [employer] disclaims any warranty or liability for your use of this information.       Learning About COPD  What is COPD?    COPD is a lung disease that makes it hard to breathe. COPD stands for chronic obstructive pulmonary disease. It is caused by damage to the lungs over many years, usually from smoking. COPD is a mix of two diseases: chronic bronchitis and emphysema.  Other things that may put you at risk for COPD include  breathing chemical fumes, dust, or air pollution over a long period of time. Secondhand smoke is also bad.  In chronic bronchitis, the airways that carry air to the lungs (bronchial tubes) get inflamed and make a lot of mucus. This can narrow or block the airways, making it hard for you to breathe. In emphysema, the air sacs in your lungs are damaged and lose their stretch. Less air gets in and out of your lungs, which makes you feel short of breath.  What can you expect when you have COPD?  COPD gets worse over time. You cannot undo the damage to your lungs.  Over time, you may find that:  ?? You get short of breath even when you do simple things like get dressed or fix a meal.  ?? It is hard to eat or exercise.  ?? You lose weight and feel weaker.  But there are things you can do to prevent more damage and feel better.  What are the symptoms?  The main symptoms are:  ?? A cough that will not go away.  ?? Mucus that comes up when you cough.  ?? Shortness of breath that gets worse with activity.  At times, your symptoms may suddenly flare up and get much worse. This is a called a COPD exacerbation (say "egg-ZASS-er-BAY-shun"). When this happens, your usual symptoms quickly get worse and stay bad. This can be dangerous. You may have to go to the hospital.  How can you keep COPD from getting worse?  Don't smoke. That is the best way to keep COPD from getting worse. If you  already smoke, it is never too late to stop. If you need help quitting, talk to your doctor about stop-smoking programs and medicines. These can increase your chances of quitting for good.  You can do other things to keep COPD from getting worse:  ?? Avoid bad air. Air pollution, chemical fumes, and dust also can make COPD worse.  ?? Get a flu shot every year. A shot may keep the flu from turning into something more serious, like pneumonia. A flu shot also may lower your chances of having a COPD flare-up.  ?? Get a pneumococcal shot. Most people need only one shot to prevent pneumonia, but doctors sometimes recommend a second shot for some people who got their first shot before they turned 65. Talk with your doctor about whether you need a second shot.  How is COPD treated?  COPD is treated with medicines and oxygen. You also can take steps at home to stay healthy and keep your COPD from getting worse.  Medicines and oxygen therapy  ?? You may be taking medicines such as:  ?? Bronchodilators. These help open your airways and make breathing easier. Bronchodilators are either short-acting (work for 6 to 9 hours) or long-acting (work for 24 hours). You inhale most bronchodilators, so they start to act quickly. Always carry your quick-relief inhaler with you in case you need it while you are away from home.  ?? Corticosteroids. These reduce airway inflammation. They come in pill or inhaled form. You must take these medicines every day for them to work well.  ?? Take your medicines exactly as prescribed. Call your doctor if you think you are having a problem with your medicine.  ?? Oxygen therapy boosts the amount of oxygen in your blood and helps you breathe easier. Use the flow rate your doctor has recommended, and do not change it without talking to your doctor first.  Other care at home  ?? If your doctor recommends it, get more exercise. Walking is a good choice. Bit by bit, increase the amount you walk every day. Try for at  least 30 minutes on most days of the week.  ?? Learn breathing methods???such as breathing through pursed lips???to help you become less short of breath.  ?? If your doctor has not set you up with a pulmonary rehabilitation program, talk to him or her about whether rehab is right for you. Rehab includes exercise programs, education about your disease and how to manage it, help with diet and other changes, and emotional support.  ?? Eat regular, healthy meals. Use bronchodilators about 1 hour before you eat to make it easier to eat. Eat several small meals instead of three large ones. Drink beverages at the end of the meal. Avoid foods that are hard to chew.  Follow-up care is a key part of your treatment and safety. Be sure to make and go to all appointments, and call your doctor if you are having problems. It's also a good idea to know your test results and keep a list of the medicines you take.  Where can you learn more?  Go to [employer].  Enter V314 in the search box to learn more about "Learning About COPD."  Current as of: November 26, 2015  Content Version: 11.3  ?? 2006-2017 [employer]. Care instructions adapted under license by [employer] (which disclaims liability or warranty for this information). If you have questions about a medical condition or this instruction, always ask your healthcare professional. [employer] disclaims any warranty or liability for your use of this information.       Diabetes Foot Health: Care Instructions  Your Care Instructions    When you have diabetes, your feet need extra care and attention. Diabetes can damage the nerve endings and blood vessels in your feet, making you less likely to notice when your feet are injured. Diabetes also limits your body's ability to fight infection and get blood to areas that need it. If you get a minor foot injury, it could become an ulcer or a serious  infection. With good foot care, you can prevent most of these problems.  Caring for your feet can be quick and easy. Most of the care can be done when you are bathing or getting ready for bed.  Follow-up care is a key part of your treatment and safety. Be sure to make and go to all appointments, and call your doctor if you are having problems. It???s also a good idea to know your test results and keep a list of the medicines you take.  How can you care for yourself at home?  ?? Keep your blood sugar close to normal by watching what and how much you eat, monitoring blood sugar, taking medicines if prescribed, and getting regular exercise.  ?? Do not smoke. Smoking affects blood flow and can make foot problems worse. If you need help quitting, talk to your doctor about stop-smoking programs and medicines. These can increase your chances of quitting for good.  ?? Eat a diet that is low in fats. High fat intake can cause fat to build up in your blood vessels and decrease blood flow.  ?? Inspect your feet daily for blisters, cuts, cracks, or sores. If you cannot see well, use a mirror or have someone help you.  ?? Take care of your feet:  ?? Wash your feet every day.  Use warm (not hot) water. Check the water temperature with your wrists or other part of your body, not your feet.  ?? Dry your feet well. Pat them dry. Do not rub the skin on your feet too hard. Dry well between your toes. If the skin on your feet stays moist, bacteria or a fungus can grow, which can lead to infection.  ?? Keep your skin soft. Use moisturizing skin cream to keep the skin on your feet soft and prevent calluses and cracks. But do not put the cream between your toes, and stop using any cream that causes a rash.  ?? Clean underneath your toenails carefully. Do not use a sharp object to clean underneath your toenails. Use the blunt end of a nail file or other rounded tool.  ?? Trim and file your toenails straight across to prevent ingrown toenails.  Use a nail clipper, not scissors. Use an emery board to smooth the edges.  ?? Change socks daily. Socks without seams are best, because seams often rub the feet. You can find socks for people with diabetes from specialty catalogs.  ?? Look inside your shoes every day for things like gravel or torn linings, which could cause blisters or sores.  ?? Buy shoes that fit well:  ?? Look for shoes that have plenty of space around the toes. This helps prevent bunions and blisters.  ?? Try on shoes while wearing the kind of socks you will usually wear with the shoes.  ?? Avoid plastic shoes. They may rub your feet and cause blisters. Good shoes should be made of materials that are flexible and breathable, such as leather or cloth.  ?? Break in new shoes slowly by wearing them for no more than an hour a day for several days. Take extra time to check your feet for red areas, blisters, or other problems after you wear new shoes.  ?? Do not go barefoot. Do not wear sandals, and do not wear shoes with very thin soles. Thin soles are easy to puncture. They also do not protect your feet from hot pavement or cold weather.  ?? Have your doctor check your feet during each visit. If you have a foot problem, see your doctor. Do not try to treat an early foot problem at home. Home remedies or treatments that you can buy without a prescription (such as corn removers) can be harmful.  ?? Always get early treatment for foot problems. A minor irritation can lead to a major problem if not properly cared for early.  When should you call for help?  Call your doctor now or seek immediate medical care if:  ?? You have a foot sore, an ulcer or break in the skin that is not healing after 4 days, bleeding corns or calluses, or an ingrown toenail.  ?? You have blue or black areas, which can mean bruising or blood flow problems.  ?? You have peeling skin or tiny blisters between your toes or cracking or oozing of the skin.   ?? You have a fever for more than 24 hours and a foot sore.  ?? You have new numbness or tingling in your feet that does not go away after you move your feet or change positions.  ?? You have unexplained or unusual swelling of the foot or ankle.  Watch closely for changes in your health, and be sure to contact your doctor if:  ?? You cannot do proper foot care.  Where can you learn  more?  Go to [employer].  Enter A739 in the search box to learn more about "Diabetes Foot Health: Care Instructions."  Current as of: November 14, 2015  Content Version: 11.3  ?? 2006-2017 [employer]. Care instructions adapted under license by [employer] (which disclaims liability or warranty for this information). If you have questions about a medical condition or this instruction, always ask your healthcare professional. [employer] disclaims any warranty or liability for your use of this information.

## 2016-05-15 NOTE — Progress Notes (Signed)
Madison Peters is a 65 y.o. female who presents to the office today with the following:  Chief Complaint   Patient presents with   ??? Follow-up     med f/u       HPI  Here for routine f/u and medication refills.  Received letter that insurance will no longer cover Prozac.  Is not completely out yet but would like to switch to something similar her insurance will cover or find a way to get approved.  Is not ready to quit smoking, but has been cutting back.    HTN  Doing well, stable.  Tolerating medications well.    DM- NID  Due for A1c.  Foot exam today.  Has false teeth.  Opthalmology visit planned.    Hypothyroid.  Stable.   No complaints.    Emphysema  Doing well.  Using inhaler less.  Has only needed 3x since June.    Health Maintenance Due   Topic Date Due   ??? EYE EXAM RETINAL OR DILATED Q1  04/20/1962 has apt   ??? Pneumococcal 19-64 Medium Risk (1 of 1 - PPSV23) 04/21/1971 today   ??? BREAST CANCER SCRN MAMMOGRAM  04/20/2002 says they called and canceled her apt, she plans to call back   ??? FOBT Q 1 YEAR AGE 48-75  04/20/2002 today   ??? INFLUENZA AGE 76 TO ADULT  04/03/2016 will return   ??? FOOT EXAM Q1  04/12/2016 today     Review of Systems   Constitutional: Negative for fever, malaise/fatigue and weight loss.   HENT: Negative.    Eyes: Negative.    Respiratory: Negative for cough, shortness of breath and wheezing.         Hx emphysema- doing well no new or worsening sxs   Cardiovascular: Negative for chest pain, palpitations and leg swelling.   Gastrointestinal: Negative.    Genitourinary: Negative.    Musculoskeletal: Negative for myalgias.   Skin: Negative for rash.   Neurological: Negative.    Psychiatric/Behavioral: Negative for depression, hallucinations, memory loss, substance abuse and suicidal ideas. The patient does not have insomnia. Nervous/anxious: hx anxiety well controlled on medication.      See HPI.    Allergies   Allergen Reactions   ??? Ace Inhibitors Cough   ??? Erythromycin Unknown (comments)    ??? Sulfa (Sulfonamide Antibiotics) Nausea and Vomiting       Current Outpatient Prescriptions   Medication Sig   ??? aspirin (ASPIRIN) 325 mg tablet Take 325 mg by mouth daily.   ??? FLUoxetine (PROZAC) 20 mg capsule Take 1 Cap by mouth two (2) times a day.   ??? atorvastatin (LIPITOR) 40 mg tablet Take 1 Tab by mouth daily.   ??? metFORMIN (GLUCOPHAGE) 500 mg tablet Takes 2 in AM and 1 at NIGHT   ??? amLODIPine (NORVASC) 10 mg tablet TAKE 1 TABLET BY MOUTH EVERY DAY   ??? levothyroxine (SYNTHROID) 50 mcg tablet TAKE 1 TABLET BY MOUTH EVERY DAY   ??? hydroCHLOROthiazide (HYDRODIURIL) 25 mg tablet TAKE 1 TABLET BY MOUTH EVERY DAY   ??? metoprolol succinate (TOPROL-XL) 50 mg XL tablet TAKE 1 TABLET BY MOUTH EVERY DAY   ??? albuterol (PROVENTIL HFA, VENTOLIN HFA, PROAIR HFA) 90 mcg/actuation inhaler Take 1 Puff by inhalation every six (6) hours as needed for Wheezing. Indications: BRONCHOSPASM PREVENTION   ??? ascorbic acid, vitamin C, (VITAMIN C) 500 mg tablet Take  by mouth.   ??? vitamin E (AQUA GEMS) 400 unit capsule Take  1,000 Units by mouth daily.   ??? acetaminophen (TYLENOL) 500 mg tablet Take  by mouth every six (6) hours as needed for Pain.   ??? SODIUM CHLORIDE (SALINE NASAL NA) by Nasal route. 2 sprays each nostril every 12 hours.   ??? multivitamin with iron (DAILY MULTI-VITAMINS/IRON) tablet Take 1 Tab by mouth daily.   ??? Cholecalciferol, Vitamin D3, (VITAMIN D3) 1,000 unit cap Take 5,000 Units by mouth daily.   ??? cyanocobalamin 1,000 mcg tablet Take 2,500 mcg by mouth every Monday and Thursday.   ??? omega-3 fatty acids-vitamin e (FISH OIL) 1,000 mg cap Take 1 Cap by mouth four (4) times daily.   ??? indomethacin (INDOCIN) 50 mg capsule Take 1 Cap by mouth three (3) times daily.     No current facility-administered medications for this visit.        Past Medical History:   Diagnosis Date   ??? Anxiety    ??? Diabetes (HCC)    ??? Dyspepsia    ??? Emphysema    ??? Headache    ??? Hypertension    ??? IGT (impaired glucose tolerance)     ??? Kidney stones    ??? Lipoma     Left calf   ??? Thyroid disease     hypothryoidism       Past Surgical History:   Procedure Laterality Date   ??? HX HEENT      Tonsillectomy at age 21       Social History     Social History   ??? Marital status: MARRIED     Spouse name: N/A   ??? Number of children: N/A   ??? Years of education: N/A     Social History Main Topics   ??? Smoking status: Current Every Day Smoker     Packs/day: 1.00   ??? Smokeless tobacco: Never Used   ??? Alcohol use No   ??? Drug use: No   ??? Sexual activity: Yes     Partners: Male     Other Topics Concern   ??? None     Social History Narrative       Family History   Problem Relation Age of Onset   ??? Heart Disease Mother    ??? Other Father      AAA         Physical Exam:  Visit Vitals   ??? BP 112/61 (BP 1 Location: Right arm, BP Patient Position: Sitting)   ??? Pulse 82   ??? Temp 97.3 ??F (36.3 ??C) (Oral)   ??? Resp 18   ??? Wt 172 lb 3.2 oz (78.1 kg)   ??? SpO2 95%   ??? BMI 27.79 kg/m2     Physical Exam   Constitutional: She is oriented to person, place, and time and well-developed, well-nourished, and in no distress.   HENT:   Head: Normocephalic and atraumatic.   Right Ear: External ear normal.   Left Ear: External ear normal.   Nose: Nose normal.   Mouth/Throat: Oropharynx is clear and moist.   Eyes: Conjunctivae and EOM are normal.   Neck: Normal range of motion. Neck supple.   Cardiovascular: Normal rate, regular rhythm, normal heart sounds and intact distal pulses.    Pulmonary/Chest: Effort normal and breath sounds normal. No respiratory distress. She has no decreased breath sounds. She has no wheezes. She has no rhonchi. She has no rales.   Abdominal: Soft. There is no tenderness.   Musculoskeletal: Normal range of motion. She exhibits  no edema.   Nl foot exam.   Lymphadenopathy:     She has no cervical adenopathy.   Neurological: She is alert and oriented to person, place, and time. She has normal motor skills, normal sensation, normal strength and normal  reflexes. No cranial nerve deficit. Gait normal.   Skin: Skin is warm and dry.   Psychiatric: Mood and affect normal.   Nursing note and vitals reviewed.      Assessment/Plan:    ICD-10-CM ICD-9-CM    1. Anxiety F41.9 300.00    2. Essential hypertension I10 401.9 URINALYSIS W/ RFLX MICROSCOPIC      METABOLIC PANEL, BASIC   3. Type 2 diabetes mellitus without complication, without long-term current use of insulin (HCC) E11.9 250.00 HM DIABETES FOOT EXAM      HEMOGLOBIN A1C WITH EAG      URINALYSIS W/ RFLX MICROSCOPIC      METABOLIC PANEL, BASIC   4. Thyroid disease E07.9 246.9 TSH 3RD GENERATION   5. Hyperlipidemia, unspecified hyperlipidemia type E78.5 272.4    6. Tobacco abuse Z72.0 305.1    7. Encounter for immunization Z23 V03.89 PNEUMOCOCCAL POLYSACCHARIDE VACCINE, 23-VALENT, ADULT OR IMMUNOSUPPRESSED PT DOSE,   8. Colon cancer screening Z12.11 V76.51 OCCULT BLOOD, IMMUNOASSAY (FIT)     Plan to contact insurance to find out issues with coverage of Prozac.  Pt has done very well on this medication, so will try to get coverage.  If not, plan to find similar options that is on formulary.  Pt has 45 days currently, will call her back once we have more information.    Will call with lab results.  Pt plans to return in a few wks for flu shot.    Follow-up Disposition:  Return in about 3 months (around 08/14/2016), or sooner prn.    Marlan Palau, PA-C

## 2016-05-15 NOTE — Progress Notes (Signed)
Chief Complaint   Patient presents with   ??? Follow-up     med f/u     Health Maintenance Due   Topic Date Due   ??? EYE EXAM RETINAL OR DILATED Q1  04/20/1961 has appt in October in [place]   ??? Pneumococcal 19-64 Medium Risk (1 of 1 - PPSV23) 04/21/1971   ??? BREAST CANCER SCRN MAMMOGRAM  04/20/2002   ??? FOBT Q 1 YEAR AGE 64-75  04/20/2002   ??? INFLUENZA AGE 48 TO ADULT  04/03/2016   ??? FOOT EXAM Q1  04/12/2016     Visit Vitals   ??? BP 112/61 (BP 1 Location: Right arm, BP Patient Position: Sitting)   ??? Pulse 82   ??? Temp 97.3 ??F (36.3 ??C) (Oral)   ??? Resp 18   ??? Wt 172 lb 3.2 oz (78.1 kg)   ??? SpO2 95%   ??? BMI 27.79 kg/m2     Jerilee Field, LPN

## 2016-05-16 LAB — METABOLIC PANEL, BASIC
BUN/Creatinine ratio: 24 (ref 12–28)
BUN: 16 mg/dL (ref 8–27)
CO2: 24 mmol/L (ref 18–29)
Calcium: 9.3 mg/dL (ref 8.7–10.3)
Chloride: 96 mmol/L (ref 96–106)
Creatinine: 0.68 mg/dL (ref 0.57–1.00)
GFR est AA: 107 mL/min/{1.73_m2} (ref >59)
GFR est non-AA: 93 mL/min/{1.73_m2} (ref >59)
Glucose: 187 mg/dL — ABNORMAL HIGH (ref 65–99)
Potassium: 3.8 mmol/L (ref 3.5–5.2)
Sodium: 137 mmol/L (ref 134–144)

## 2016-05-16 LAB — MICROSCOPIC EXAMINATION
Casts: NONE SEEN /lpf
Epithelial cells: >10 /hpf — AB (ref 0–10)
RBC: NONE SEEN /hpf (ref 0–?)
WBC: >30 /hpf — AB (ref 0–?)

## 2016-05-16 LAB — URINALYSIS W/ RFLX MICROSCOPIC
Bilirubin: NEGATIVE
Blood: NEGATIVE
Glucose: NEGATIVE
Ketone: NEGATIVE
Nitrites: NEGATIVE
Specific Gravity: 1.018 (ref 1.005–1.030)
Urobilinogen: 0.2 mg/dL (ref 0.2–1.0)
pH (UA): 6 (ref 5.0–7.5)

## 2016-05-16 LAB — HEMOGLOBIN A1C WITH EAG
Estimated average glucose: 189 mg/dL
Hemoglobin A1c: 8.2 % — ABNORMAL HIGH (ref 4.8–5.6)

## 2016-05-16 LAB — TSH 3RD GENERATION: TSH: 1.87 u[IU]/mL (ref 0.450–4.500)

## 2016-05-16 MED ORDER — FLUOXETINE 20 MG CAP
20 mg | ORAL_CAPSULE | Freq: Two times a day (BID) | ORAL | 2 refills | Status: DC
Start: 2016-05-16 — End: 2016-11-13

## 2016-05-16 MED ORDER — METFORMIN 1,000 MG TAB
1000 mg | ORAL_TABLET | ORAL | 0 refills | Status: DC
Start: 2016-05-16 — End: 2016-11-15

## 2016-05-16 NOTE — Telephone Encounter (Signed)
Have attempted to call pt twice

## 2016-05-16 NOTE — Addendum Note (Signed)
Addended by: Marlan PalauJENKINS, Abrian Hanover S on: 05/16/2016 09:41 AM      Modules accepted: Orders

## 2016-05-16 NOTE — Addendum Note (Signed)
Addended by: Marlan Palau on: 05/16/2016 06:25 PM      Modules accepted: Orders

## 2016-05-16 NOTE — Telephone Encounter (Signed)
Please notify pt I have corrected the way the Rx was written for her Prozac so insurance should cover w/o issues.

## 2016-05-16 NOTE — Progress Notes (Signed)
Notify pt minimal improvement on A1c, still high. I now rec max dose of Metformin. Will send in new Rx of 1000 mg for pt to take 1.5 tabs in AM and 1 in PM. Would like her to f/u in 1 month, see if she is able to monitor her BS readings in the meantime and keep a log for that visit. Will see if she tolerates well and may consider adding second medication depending on BS readings (check FBS and 2 hr postprandial dinner readings). To cont to work on diet/ex low in conc sweets/carbs/starches.    Also, please confirm with pt still no urinary or vaginal sxs?? She does have a pretty significant amt of leukocytes/wbc in urine. I would like her to stop by to leave another specimen *CLEAN CATCH for [place] to retest and culture. If she dev any urinary or vaginal sxs to notify or rtc asap.    Thyroid test and other labs okay.

## 2016-05-17 NOTE — Progress Notes (Signed)
Left message to discuss labs

## 2016-05-17 NOTE — Progress Notes (Signed)
I have called and spoke to [place] at [facility], she advises me that the patient picked up and paid for the Fluoxetine that was denied by her [employer] company because of the way it had been written.  The new RX that was written correctly does go through, the pharmacy will hold the rx until she needs it.  Benito MccreedyStacy C Romaine Maciolek, RN

## 2016-05-17 NOTE — Telephone Encounter (Signed)
Have labs to discuss with patient also LMFCB

## 2016-05-17 NOTE — Telephone Encounter (Signed)
I have called and left a message for this patient to return my call to discuss this refill and lab results.  Benito Mccreedy, RN

## 2016-05-18 NOTE — Telephone Encounter (Signed)
Patient notified about rx

## 2016-05-18 NOTE — Progress Notes (Signed)
Patient notified about labs and dose increase

## 2016-08-02 ENCOUNTER — Encounter: Attending: Family Medicine | Primary: Physician Assistant

## 2016-08-02 ENCOUNTER — Encounter: Attending: Physician Assistant | Primary: Physician Assistant

## 2016-11-13 ENCOUNTER — Encounter

## 2016-11-13 MED ORDER — FLUOXETINE 20 MG CAP
20 mg | ORAL_CAPSULE | Freq: Two times a day (BID) | ORAL | 0 refills | Status: DC
Start: 2016-11-13 — End: 2017-03-03

## 2016-11-13 NOTE — Telephone Encounter (Signed)
Pt scheduled appt w/PSR to see [name] in 2 weeks to f/u with her med refills

## 2016-11-13 NOTE — Telephone Encounter (Signed)
Patient requesting 3 month refill of Prozac  Patient usually follows with [name]  Madison Peters did want to see her back in December for follow-up of her other medical issues  Refill okay ??1  Patient to be contacted and follow-up scheduled with [name]

## 2016-11-13 NOTE — Telephone Encounter (Signed)
Left message for pt to call back to remind pt to keep her f/u appt. Next month with Dr. Myrtis SerLiljeberg; also pt. Med refill is approved and sent to pharmacy for pick up

## 2016-11-13 NOTE — Telephone Encounter (Signed)
Requested Prescriptions     Pending Prescriptions Disp Refills   ??? FLUoxetine (PROZAC) 20 mg capsule 60 Cap 2     Sig: Take 1 Cap by mouth two (2) times a day.       Please send 90 day supply

## 2016-11-15 ENCOUNTER — Encounter

## 2016-11-16 MED ORDER — METFORMIN 1,000 MG TAB
1000 mg | ORAL_TABLET | ORAL | 0 refills | Status: DC
Start: 2016-11-16 — End: 2016-12-21

## 2016-11-29 ENCOUNTER — Encounter: Attending: Physician Assistant | Primary: Physician Assistant

## 2016-12-10 ENCOUNTER — Ambulatory Visit
Admit: 2016-12-10 | Discharge: 2016-12-10 | Payer: PRIVATE HEALTH INSURANCE | Attending: Physician Assistant | Primary: Physician Assistant

## 2016-12-10 DIAGNOSIS — E119 Type 2 diabetes mellitus without complications: Secondary | ICD-10-CM

## 2016-12-10 LAB — AMB POC HEMOGLOBIN A1C: Hemoglobin A1c (POC): 8.9 %

## 2016-12-10 MED ORDER — ALBUTEROL SULFATE HFA 90 MCG/ACTUATION AEROSOL INHALER
90 mcg/actuation | Freq: Four times a day (QID) | RESPIRATORY_TRACT | 2 refills | Status: DC | PRN
Start: 2016-12-10 — End: 2017-05-08

## 2016-12-10 MED ORDER — AMOXICILLIN 875 MG TAB
875 mg | ORAL_TABLET | Freq: Two times a day (BID) | ORAL | 0 refills | Status: AC
Start: 2016-12-10 — End: 2016-12-20

## 2016-12-10 NOTE — Progress Notes (Signed)
Chief Complaint   Patient presents with   ??? Diabetes     Health Maintenance Due   Topic Date Due   ??? BREAST CANCER SCRN MAMMOGRAM  04/20/2002   ??? FOBT Q 1 YEAR AGE 65-75  04/20/2002   ??? Influenza Age 50 to Adult  04/03/2016   ??? MICROALBUMIN Q1  11/02/2016   ??? LIPID PANEL Q1  11/02/2016   ??? HEMOGLOBIN A1C Q6M  11/12/2016     Visit Vitals   ??? BP 117/70 (BP 1 Location: Left arm, BP Patient Position: Sitting)   ??? Pulse 94   ??? Temp 98.2 ??F (36.8 ??C) (Oral)   ??? Resp 18   ??? Ht  (1.676 m)   ??? Wt 175 lb 9.6 oz (79.7 kg)   ??? SpO2 90%   ??? BMI 28.34 kg/m2     Jerilee Field, LPN

## 2016-12-10 NOTE — Patient Instructions (Signed)
Body Mass Index: Care Instructions  Your Care Instructions    Body mass index (BMI) can help you see if your weight is raising your risk for health problems. It uses a formula to compare how much you weigh with how tall you are.  ?? A BMI lower than 18.5 is considered underweight.  ?? A BMI between 18.5 and 24.9 is considered healthy.  ?? A BMI between 25 and 29.9 is considered overweight. A BMI of 30 or higher is considered obese.  If your BMI is in the normal range, it means that you have a lower risk for weight-related health problems. If your BMI is in the overweight or obese range, you may be at increased risk for weight-related health problems, such as high blood pressure, heart disease, stroke, arthritis or joint pain, and diabetes. If your BMI is in the underweight range, you may be at increased risk for health problems such as fatigue, lower protection (immunity) against illness, muscle loss, bone loss, hair loss, and hormone problems.  BMI is just one measure of your risk for weight-related health problems. You may be at higher risk for health problems if you are not active, you eat an unhealthy diet, or you drink too much alcohol or use tobacco products.  Follow-up care is a key part of your treatment and safety. Be sure to make and go to all appointments, and call your doctor if you are having problems. It's also a good idea to know your test results and keep a list of the medicines you take.  How can you care for yourself at home?  ?? Practice healthy eating habits. This includes eating plenty of fruits, vegetables, whole grains, lean protein, and low-fat dairy.  ?? If your doctor recommends it, get more exercise. Walking is a good choice. Bit by bit, increase the amount you walk every day. Try for at least 30 minutes on most days of the week.  ?? Do not smoke. Smoking can increase your risk for health problems. If you need help quitting, talk to your doctor about stop-smoking programs and  medicines. These can increase your chances of quitting for good.  ?? Limit alcohol to 2 drinks a day for men and 1 drink a day for women. Too much alcohol can cause health problems.  If you have a BMI higher than 25  ?? Your doctor may do other tests to check your risk for weight-related health problems. This may include measuring the distance around your waist. A waist measurement of more than 40 inches in men or 35 inches in women can increase the risk of weight-related health problems.  ?? Talk with your doctor about steps you can take to stay healthy or improve your health. You may need to make lifestyle changes to lose weight and stay healthy, such as changing your diet and getting regular exercise.  If you have a BMI lower than 18.5  ?? Your doctor may do other tests to check your risk for health problems.  ?? Talk with your doctor about steps you can take to stay healthy or improve your health. You may need to make lifestyle changes to gain or maintain weight and stay healthy, such as getting more healthy foods in your diet and doing exercises to build muscle.  Where can you learn more?  Go to http://www.healthwise.net/[employer].  Enter S176 in the search box to learn more about "Body Mass Index: Care Instructions."  Current as of: June 16, 2015  Content Version: 11.4  ??   2006-2017 [employer]. Care instructions adapted under license by [employer] (which disclaims liability or warranty for this information). If you have questions about a medical condition or this instruction, always ask your healthcare professional. [employer]wise, Inc[employer] disclaims any warranty or liability for your use of this information.

## 2016-12-10 NOTE — Progress Notes (Signed)
Madison Peters is a 66 y.o. female who presents to the office today with the following:  Chief Complaint   Patient presents with   ??? Diabetes       HPI  Here for routine f/u, BW, and refills.  Is not fasting but due for labs.    Feeling well overall, but does think has a sinus infection.   Head congestion, nasal congestion, a little ear pain, eyes, no gum pain x 4d.  Has hx and "knows its sinus," does not believe she has seasonal allergies to her knowledge, but has noticed requires inhaler at times around this time of year.   Has had no fever/chills, ST, neck pain, n/v/d. Cough unchanged from her usual "smokers/copd" cough.    Health Maintenance Due   Topic Date Due   ??? BREAST CANCER SCRN MAMMOGRAM  04/20/2002 was suppose to schedule previously, still plans   ??? FOBT Q 1 YEAR AGE 68-75  04/20/2002 provided last routine visit, she cannot find, needs another   ??? Influenza Age 70 to Adult  04/03/2016 will get today, no recnt fevers, will try to get in Oct next year   ??? MICROALBUMIN Q1  11/02/2016 today   ??? LIPID PANEL Q1  11/02/2016 will return fasting for these labs   ??? HEMOGLOBIN A1C Q6M  11/12/2016 today     Review of Systems   Constitutional: Negative for chills, fever, malaise/fatigue and weight loss.   HENT: Positive for congestion, ear pain and sinus pain. Negative for sore throat.    Eyes: Negative.    Respiratory: Positive for wheezing (occasional with COPD and changing weather, only req inhaler occasionally). Negative for cough and shortness of breath.    Cardiovascular: Negative for chest pain, palpitations and leg swelling.   Gastrointestinal: Negative.    Genitourinary: Negative.    Musculoskeletal: Negative for myalgias.   Neurological: Negative for dizziness, tingling, tremors, sensory change, speech change, focal weakness, seizures, loss of consciousness and headaches.   Psychiatric/Behavioral: Negative for depression, hallucinations, memory loss, substance abuse and suicidal ideas. The patient is not  nervous/anxious and does not have insomnia.      See HPI.    Past Medical History:   Diagnosis Status   ??? Anxiety    ??? Diabetes ([place]) Was unable to increase her Metformin as previously rec.  Has been taking 1000MG AM and 500MG PM.  Just received new Rx for 1000MG to take 1.5AM and 1 tab PM  Thinks A1c will probably be worse.     ??? Dyspepsia asxs   ??? Emphysema Occasionally needs inhaler right now due to weather changes, still smokes, no new or worsening cough/wheeze/sob   ??? Headache(784.0) No complaints today   ??? Hypertension Doing well on current regimen; amlodipine 31m, HCTZ 233m metoprolol 5023mL, No recent HAs, CP, SOB, or TIA sxs.     ??? IGT (impaired glucose tolerance) DM- a1c today   ??? Kidney stones No sxs   ??? Lipoma Pt unsure where this came from, does not have anymore to her knowledge- thinks maybe resolved on own.    Left calf   ??? Thyroid disease Due for labs. Asxs. On levothyroxine 67m40m   hypothryoidism   Also known hx Hyperlipidemia.  Takes atorvastatin- tolerates well no myalgias.   Will return fasting for updated labs.  Lab Results   Component Value Date/Time    Cholesterol, total 119 11/03/2015 08:02 AM    HDL Cholesterol 30 (L) 11/03/2015 08:02 AM  LDL, calculated 40 11/03/2015 08:02 AM    VLDL, calculated 49 (H) 11/03/2015 08:02 AM    Triglyceride 244 (H) 11/03/2015 08:02 AM     Lab Results   Component Value Date/Time    ALT (SGPT) 18 11/03/2015 08:02 AM    AST (SGOT) 13 11/03/2015 08:02 AM    Alk. phosphatase 75 11/03/2015 08:02 AM    Bilirubin, total 0.4 11/03/2015 08:02 AM       Also d/c aspirin 370m daily, unsure why, thinks maybe was sick and we agreed to d/c temporarily but she was confused and has not had.  Wants to resume baby ASA if rec. Based on recent VS and last lab has 20% 10year risk.   No h/o unusual bruising/bleeding.  Past Surgical History:   Procedure Laterality Date   ??? HX HEENT      Tonsillectomy at age 66      Allergies   Allergen Reactions   ??? Ace Inhibitors Cough    ??? Erythromycin Unknown (comments)   ??? Sulfa (Sulfonamide Antibiotics) Nausea and Vomiting       Current Outpatient Prescriptions   Medication Sig   ??? AZO CRANBERRY 450-30-50 mg-mg-million tab Take  by mouth.   ??? albuterol (PROVENTIL HFA, VENTOLIN HFA, PROAIR HFA) 90 mcg/actuation inhaler Take 1 Puff by inhalation every six (6) hours as needed for Wheezing. Indications: BRONCHOSPASM PREVENTION   ??? amoxicillin (AMOXIL) 875 mg tablet Take 1 Tab by mouth two (2) times a day for 10 days.   ??? metFORMIN (GLUCOPHAGE) 1,000 mg tablet TAKE 1.5 TABLET IN AM AND 1 TABLET IN PM WITH MEALS. (Patient taking differently: pt is taking 5010mtab, 2 tabs in morning, 1 tab at night)   ??? FLUoxetine (PROZAC) 20 mg capsule Take 1 Cap by mouth two (2) times a day.   ??? aspirin (ASPIRIN) 325 mg tablet Take 325 mg by mouth daily.   ??? atorvastatin (LIPITOR) 40 mg tablet Take 1 Tab by mouth daily.   ??? amLODIPine (NORVASC) 10 mg tablet TAKE 1 TABLET BY MOUTH EVERY DAY   ??? levothyroxine (SYNTHROID) 50 mcg tablet TAKE 1 TABLET BY MOUTH EVERY DAY   ??? hydroCHLOROthiazide (HYDRODIURIL) 25 mg tablet TAKE 1 TABLET BY MOUTH EVERY DAY   ??? metoprolol succinate (TOPROL-XL) 50 mg XL tablet TAKE 1 TABLET BY MOUTH EVERY DAY   ??? ascorbic acid, vitamin C, (VITAMIN C) 500 mg tablet Take  by mouth.   ??? vitamin E (AQUA GEMS) 400 unit capsule Take 1,000 Units by mouth daily.   ??? acetaminophen (TYLENOL) 500 mg tablet Take  by mouth every six (6) hours as needed for Pain.   ??? SODIUM CHLORIDE (SALINE NASAL NA) by Nasal route. 2 sprays each nostril every 12 hours.   ??? multivitamin with iron (DAILY MULTI-VITAMINS/IRON) tablet Take 1 Tab by mouth daily.   ??? Cholecalciferol, Vitamin D3, (VITAMIN D3) 1,000 unit cap Take 5,000 Units by mouth daily.   ??? cyanocobalamin 1,000 mcg tablet Take 2,500 mcg by mouth every Monday and Thursday.   ??? omega-3 fatty acids-vitamin e (FISH OIL) 1,000 mg cap Take 1 Cap by mouth four (4) times daily.    ??? indomethacin (INDOCIN) 50 mg capsule Take 1 Cap by mouth three (3) times daily.     No current facility-administered medications for this visit.        Social History     Social History   ??? Marital status: MARRIED     Spouse name: N/A   ???  Number of children: N/A   ??? Years of education: N/A     Social History Main Topics   ??? Smoking status: Current Every Day Smoker     Packs/day: 1.00   ??? Smokeless tobacco: Never Used   ??? Alcohol use No   ??? Drug use: No   ??? Sexual activity: Yes     Partners: Male     Other Topics Concern   ??? None     Social History Narrative       Family History   Problem Relation Age of Onset   ??? Heart Disease Mother    ??? Other Father      AAA         Physical Exam:  Visit Vitals   ??? BP 117/70 (BP 1 Location: Left arm, BP Patient Position: Sitting)   ??? Pulse 94   ??? Temp 98.2 ??F (36.8 ??C) (Oral)   ??? Resp 18   ??? Ht 5' 6"  (1.676 m)   ??? Wt 175 lb 9.6 oz (79.7 kg)   ??? SpO2 90%   ??? BMI 28.34 kg/m2     Physical Exam   Constitutional: She is oriented to person, place, and time and well-developed, well-nourished, and in no distress.   HENT:   Head: Normocephalic and atraumatic.   Right Ear: External ear and ear canal normal.   Left Ear: External ear and ear canal normal.   Nose: Mucosal edema present. Right sinus exhibits no maxillary sinus tenderness and no frontal sinus tenderness. Left sinus exhibits no maxillary sinus tenderness and no frontal sinus tenderness.   Mouth/Throat: Uvula is midline, oropharynx is clear and moist and mucous membranes are normal.   bilat cerumen impaction   Eyes: Conjunctivae and EOM are normal.   Neck: Normal range of motion. Neck supple.   Cardiovascular: Normal rate and regular rhythm.    Pulmonary/Chest: Effort normal and breath sounds normal. No respiratory distress. She has no wheezes. She has no rales.   Abdominal: Soft. There is no tenderness.   Lymphadenopathy:     She has no cervical adenopathy.    Neurological: She is alert and oriented to person, place, and time. Gait normal.   Skin: Skin is warm and dry.   Psychiatric: Mood and affect normal.   Nursing note and vitals reviewed.      Assessment/Plan:    ICD-10-CM ICD-9-CM    1. Type 2 diabetes mellitus without complication, without long-term current use of insulin (HCC) E11.9 250.00 AMB POC HEMOGLOBIN A1C      CBC WITH AUTOMATED DIFF      METABOLIC PANEL, COMPREHENSIVE      MICROALBUMIN, UR, RAND W/ MICROALB/CREAT RATIO   2. Essential hypertension I10 401.9 CBC WITH AUTOMATED DIFF      METABOLIC PANEL, COMPREHENSIVE   3. Hyperlipidemia, unspecified hyperlipidemia type E78.5 272.4 LIPID PANEL   4. Pulmonary emphysema, unspecified emphysema type (HCC) J43.9 492.8 albuterol (PROVENTIL HFA, VENTOLIN HFA, PROAIR HFA) 90 mcg/actuation inhaler   5. Thyroid disease E07.9 246.9 TSH 3RD GENERATION   6. Tobacco abuse Z72.0 305.1    7. Encounter for immunization Z23 V03.89 INFLUENZA VIRUS VACCINE, HIGH DOSE SEASONAL, PRESERVATIVE FREE   8. Wheeze R06.2 786.07 albuterol (PROVENTIL HFA, VENTOLIN HFA, PROAIR HFA) 90 mcg/actuation inhaler   9. Colon cancer screening Z12.11 V76.51 OCCULT BLOOD, IMMUNOASSAY (FIT)   10. Acute non-recurrent sinusitis, unspecified location J01.90 461.9 amoxicillin (AMOXIL) 875 mg tablet  - will hold abx.  - suspect more seasonal allergies/early  sinusitis. Discussed tx, abx printed will only take if needed, counseled on sxs to monitor for. Will rtc if any new/worsening or concerning sxs. In meantime, rec conservative tx. Discussed allergy/otc  meds and home remedies.     Discussed the patient's BMI with her.  The BMI follow up plan is as follows:     dietary management education, guidance, and counseling  encourage exercise  monitor weight  prescribed dietary intake    An After Visit Summary was printed and given to the patient.    Total time spent with the patient today was 25 minutes of which more than  50% was spent face to face with the patient.    Pt will return for fasting labs.  Will schedule her mammogram.    Follow-up Disposition:  Return in about 3 months (around 03/11/2017), or if symptoms worsen or fail to improve.    Juanna Cao, PA-C

## 2016-12-10 NOTE — Progress Notes (Signed)
Notify pt a1c worse to 8.9 previous 8.4 I would like her to resume newer dosing as discussedat visit today, cont to work on diet/ex as well

## 2016-12-11 LAB — MICROALBUMIN, UR, RAND W/ MICROALB/CREAT RATIO
Creatinine, urine random: 79.4 mg/dL
Microalb/Creat ratio (ug/mg creat.): 24.8 mg/g creat (ref 0.0–30.0)
Microalbumin, urine: 19.7 ug/mL

## 2016-12-11 NOTE — Progress Notes (Signed)
Notify pt urine improved, spilling less protein, looks much better vs 1 yr ago.

## 2016-12-11 NOTE — Progress Notes (Signed)
Spoke with patient - she has been informed of A1c.

## 2016-12-12 NOTE — Progress Notes (Signed)
I have called and talked to this patient, advised of lab results per [facility], PA-C's review.  Patient verbalizes understanding.

## 2016-12-12 NOTE — Telephone Encounter (Signed)
Patient returning call for labs

## 2016-12-12 NOTE — Telephone Encounter (Signed)
I have called and talked to this patient, advised of lab results per [facility], PA-C's review.  Patient verbalizes understanding.

## 2016-12-12 NOTE — Progress Notes (Signed)
Left message for a call back, re: recent labs.

## 2016-12-14 ENCOUNTER — Encounter

## 2016-12-14 NOTE — Telephone Encounter (Signed)
Seen by Sarah

## 2016-12-19 ENCOUNTER — Institutional Professional Consult (permissible substitution): Admit: 2016-12-19 | Payer: PRIVATE HEALTH INSURANCE | Primary: Physician Assistant

## 2016-12-19 DIAGNOSIS — E119 Type 2 diabetes mellitus without complications: Secondary | ICD-10-CM

## 2016-12-19 NOTE — Progress Notes (Signed)
Patient in office for lab draw only, venipuncture (L) AC, patient tolerated procedure well.

## 2016-12-20 LAB — METABOLIC PANEL, COMPREHENSIVE
A-G Ratio: 2 (ref 1.2–2.2)
ALT (SGPT): 31 IU/L (ref 0–32)
AST (SGOT): 15 IU/L (ref 0–40)
Albumin: 4.7 g/dL (ref 3.6–4.8)
Alk. phosphatase: 69 IU/L (ref 39–117)
BUN/Creatinine ratio: 18 (ref 12–28)
BUN: 12 mg/dL (ref 8–27)
Bilirubin, total: 0.4 mg/dL (ref 0.0–1.2)
CO2: 24 mmol/L (ref 18–29)
Calcium: 9.2 mg/dL (ref 8.7–10.3)
Chloride: 99 mmol/L (ref 96–106)
Creatinine: 0.67 mg/dL (ref 0.57–1.00)
GFR est AA: 107 mL/min/{1.73_m2} (ref >59)
GFR est non-AA: 93 mL/min/{1.73_m2} (ref >59)
GLOBULIN, TOTAL: 2.3 g/dL (ref 1.5–4.5)
Glucose: 141 mg/dL — ABNORMAL HIGH (ref 65–99)
Potassium: 3.9 mmol/L (ref 3.5–5.2)
Protein, total: 7 g/dL (ref 6.0–8.5)
Sodium: 139 mmol/L (ref 134–144)

## 2016-12-20 LAB — CBC WITH AUTOMATED DIFF
ABS. BASOPHILS: 0.2 10*3/uL (ref 0.0–0.2)
ABS. EOSINOPHILS: 0.3 10*3/uL (ref 0.0–0.4)
ABS. IMM. GRANS.: 0 10*3/uL (ref 0.0–0.1)
ABS. MONOCYTES: 0.9 10*3/uL (ref 0.1–0.9)
ABS. NEUTROPHILS: 6 10*3/uL (ref 1.4–7.0)
Abs Lymphocytes: 2.6 10*3/uL (ref 0.7–3.1)
BASOPHILS: 2 %
EOSINOPHILS: 3 %
HCT: 41.5 % (ref 34.0–46.6)
HGB: 14 g/dL (ref 11.1–15.9)
IMMATURE GRANULOCYTES: 0 %
Lymphocytes: 26 %
MCH: 30.9 pg (ref 26.6–33.0)
MCHC: 33.7 g/dL (ref 31.5–35.7)
MCV: 92 fL (ref 79–97)
MONOCYTES: 9 %
NEUTROPHILS: 60 %
PLATELET: 283 10*3/uL (ref 150–379)
RBC: 4.53 x10E6/uL (ref 3.77–5.28)
RDW: 13.8 % (ref 12.3–15.4)
WBC: 9.9 10*3/uL (ref 3.4–10.8)

## 2016-12-20 LAB — CVD REPORT

## 2016-12-20 LAB — LIPID PANEL
Cholesterol, total: 124 mg/dL (ref 100–199)
HDL Cholesterol: 28 mg/dL — ABNORMAL LOW (ref >39)
LDL, calculated: 22 mg/dL (ref 0–99)
Triglyceride: 368 mg/dL — ABNORMAL HIGH (ref 0–149)
VLDL, calculated: 74 mg/dL — ABNORMAL HIGH (ref 5–40)

## 2016-12-20 LAB — TSH 3RD GENERATION: TSH: 1.71 u[IU]/mL (ref 0.450–4.500)

## 2016-12-20 NOTE — Progress Notes (Signed)
Pt notified of lab results and medication change; verbalized understanding

## 2016-12-20 NOTE — Progress Notes (Signed)
Cbc okay, kidney and liver tests okay, trig have gone back up, tsh wnl.  Work on cutting out trans.sat fats in diet to get trig levels down.   May also go to max dose of atorvastatin . May double  and when out req refill of .  Recheck fasting in 7mo.

## 2016-12-21 ENCOUNTER — Encounter

## 2016-12-21 MED ORDER — METFORMIN 1,000 MG TAB
1000 mg | ORAL_TABLET | ORAL | 0 refills | Status: DC
Start: 2016-12-21 — End: 2017-01-24

## 2016-12-21 NOTE — Telephone Encounter (Signed)
Requested Prescriptions     Pending Prescriptions Disp Refills   ??? metFORMIN (GLUCOPHAGE) 1,000 mg tablet 90 Tab 0

## 2016-12-21 NOTE — Telephone Encounter (Signed)
I have called and left a detailed message for this patient, RX sent to pharmacy, due for apt so needs to call and make apt asap.

## 2017-01-24 ENCOUNTER — Encounter

## 2017-01-24 MED ORDER — METFORMIN 1,000 MG TAB
1000 mg | ORAL_TABLET | ORAL | 2 refills | Status: DC
Start: 2017-01-24 — End: 2017-05-08

## 2017-02-09 ENCOUNTER — Encounter

## 2017-02-11 MED ORDER — METOPROLOL SUCCINATE SR 50 MG 24 HR TAB
50 mg | ORAL_TABLET | ORAL | 2 refills | Status: DC
Start: 2017-02-11 — End: 2017-05-08

## 2017-02-11 MED ORDER — ATORVASTATIN 40 MG TAB
40 mg | ORAL_TABLET | ORAL | 2 refills | Status: DC
Start: 2017-02-11 — End: 2017-05-08

## 2017-02-11 MED ORDER — LEVOTHYROXINE 50 MCG TAB
50 mcg | ORAL_TABLET | ORAL | 2 refills | Status: DC
Start: 2017-02-11 — End: 2017-05-08

## 2017-02-11 MED ORDER — HYDROCHLOROTHIAZIDE 25 MG TAB
25 mg | ORAL_TABLET | ORAL | 2 refills | Status: DC
Start: 2017-02-11 — End: 2017-05-08

## 2017-02-11 MED ORDER — AMLODIPINE 10 MG TAB
10 mg | ORAL_TABLET | ORAL | 2 refills | Status: DC
Start: 2017-02-11 — End: 2017-05-08

## 2017-02-11 NOTE — Telephone Encounter (Signed)
Left voicemail to notify pt that her medications have been refilled and sent to her pharmacy

## 2017-03-03 ENCOUNTER — Encounter

## 2017-03-04 MED ORDER — FLUOXETINE 20 MG CAP
20 mg | ORAL_CAPSULE | ORAL | 0 refills | Status: DC
Start: 2017-03-04 — End: 2017-05-08

## 2017-03-04 NOTE — Telephone Encounter (Signed)
Pt due for f/u this month

## 2017-03-04 NOTE — Telephone Encounter (Signed)
Patient follows with Sarah

## 2017-03-05 NOTE — Telephone Encounter (Signed)
I have called and talked to this patient, she will call and make an apt for the end of the month.

## 2017-05-08 ENCOUNTER — Ambulatory Visit
Admit: 2017-05-08 | Discharge: 2017-05-08 | Payer: PRIVATE HEALTH INSURANCE | Attending: Physician Assistant | Primary: Physician Assistant

## 2017-05-08 DIAGNOSIS — I10 Essential (primary) hypertension: Secondary | ICD-10-CM

## 2017-05-08 MED ORDER — ALBUTEROL SULFATE HFA 90 MCG/ACTUATION AEROSOL INHALER
90 mcg/actuation | Freq: Four times a day (QID) | RESPIRATORY_TRACT | 5 refills | Status: DC | PRN
Start: 2017-05-08 — End: 2018-01-15

## 2017-05-08 MED ORDER — ATORVASTATIN 40 MG TAB
40 mg | ORAL_TABLET | ORAL | 2 refills | Status: DC
Start: 2017-05-08 — End: 2017-05-15

## 2017-05-08 MED ORDER — LEVOTHYROXINE 50 MCG TAB
50 mcg | ORAL_TABLET | ORAL | 2 refills | Status: DC
Start: 2017-05-08 — End: 2018-01-20

## 2017-05-08 MED ORDER — HYDROCHLOROTHIAZIDE 25 MG TAB
25 mg | ORAL_TABLET | ORAL | 2 refills | Status: DC
Start: 2017-05-08 — End: 2018-01-20

## 2017-05-08 MED ORDER — METOPROLOL SUCCINATE SR 50 MG 24 HR TAB
50 mg | ORAL_TABLET | ORAL | 2 refills | Status: DC
Start: 2017-05-08 — End: 2018-01-20

## 2017-05-08 MED ORDER — METFORMIN 1,000 MG TAB
1000 mg | ORAL_TABLET | ORAL | 2 refills | Status: DC
Start: 2017-05-08 — End: 2017-05-09

## 2017-05-08 MED ORDER — AMLODIPINE 10 MG TAB
10 mg | ORAL_TABLET | Freq: Every day | ORAL | 2 refills | Status: DC
Start: 2017-05-08 — End: 2018-01-20

## 2017-05-08 MED ORDER — FLUOXETINE 20 MG CAP
20 mg | ORAL_CAPSULE | ORAL | 0 refills | Status: DC
Start: 2017-05-08 — End: 2017-05-09

## 2017-05-08 NOTE — Progress Notes (Signed)
Madison Peters is a 66 y.o. female who presents to the office today with the following:  Chief Complaint   Patient presents with   ??? Medication Refill       HPI  Due for refills and is fasting for BW.  Reports all conditions are stable, taking her medications regularly w/o SEs.  Is a little congested today but does not otherwise feel sick.  Denies any other URI sxs and feels well with no other complaints.    Due for fasting labs, including lipid and A1c.  Previous rec increase atorvastatin from 40 mg to 80 mg, but pt does not recall.  Will check this again today.  Lab Results   Component Value Date/Time    Cholesterol, total 124 12/19/2016 01:21 PM    HDL Cholesterol 28 (L) 12/19/2016 01:21 PM    LDL, calculated 22 12/19/2016 01:21 PM    VLDL, calculated 74 (H) 12/19/2016 01:21 PM    Triglyceride 368 (H) 12/19/2016 01:21 PM     Health Maintenance Due   Topic Date Due   ??? BREAST CANCER SCRN MAMMOGRAM  04/20/2002 has not scheduled yet   ??? FOBT Q 1 YEAR AGE 95-75  04/20/2002 have given patient two of these and keeps forgetting, will give one more     ??? Influenza Age 34 to Adult  04/03/2017 pt starting to sniffle a little, wants to hold off to make sure not getting URI first   ??? Bone Densitometry (Dexa) Screening  04/20/2017 will schedule    ??? FOOT EXAM Q1  05/15/2017 today   Did got to eye doctor.     Also would like toe nail looked at since she is a diabetic.  Was cutting toe nail 1 week ago and right great toe nail came off with new nail underneath.  Did not hurt, no other sxs- surrounding skin is fine, no sxs of infection.    Review of Systems   Constitutional: Negative for diaphoresis, fever and malaise/fatigue.   HENT: Positive for congestion. Negative for sinus pain and sore throat.    Respiratory: Negative for cough, shortness of breath and wheezing.    Cardiovascular: Negative for chest pain.   Gastrointestinal: Negative.    Genitourinary: Negative.    Musculoskeletal: Negative for myalgias.    Skin: Negative for rash.   Neurological: Negative.  Negative for dizziness and headaches.   Psychiatric/Behavioral: Negative for depression, hallucinations, memory loss, substance abuse and suicidal ideas. The patient does not have insomnia.         Anxiety well controlled     See HPI.    Past Medical History:   Diagnosis Date   ??? Anxiety- doing well on current regimen. No depression/SI/HI.    ??? Diabetes (HCC)    ??? Dyspepsia    ??? Emphysema    ??? Headache(784.0)    ??? Hypertension- is not checking at home bc has been well controlled.    ??? IGT (impaired glucose tolerance)    ??? Kidney stones    ??? Lipoma     Left calf   ??? Thyroid disease     hypothryoidism       Past Surgical History:   Procedure Laterality Date   ??? HX HEENT      Tonsillectomy at age 35       Allergies   Allergen Reactions   ??? Ace Inhibitors Cough   ??? Amoxicillin Nausea and Vomiting   ??? Erythromycin Unknown (comments)     Does  not remember.   ??? Sulfa (Sulfonamide Antibiotics) Nausea and Vomiting       Current Outpatient Prescriptions   Medication Sig   ??? cranberry extract 450 mg tab tablet Take 450 mg by mouth.   ??? amLODIPine (NORVASC) 10 mg tablet Take 1 Tab by mouth daily.   ??? atorvastatin (LIPITOR) 40 mg tablet TAKE 1 TABLET BY MOUTH DAILY.   ??? FLUoxetine (PROZAC) 20 mg capsule TAKE ONE CAPSULE BY MOUTH TWICE A DAY   ??? hydroCHLOROthiazide (HYDRODIURIL) 25 mg tablet TAKE 1 TABLET BY MOUTH EVERY DAY   ??? levothyroxine (SYNTHROID) 50 mcg tablet TAKE 1 TABLET BY MOUTH EVERY DAY   ??? metFORMIN (GLUCOPHAGE) 1,000 mg tablet TAKE 1&1/2 TABLETS EVERY MORNING AND 1 IN THE EVENING WITH A MEAL   ??? metoprolol succinate (TOPROL-XL) 50 mg XL tablet TAKE 1 TABLET BY MOUTH EVERY DAY   ??? albuterol (PROVENTIL HFA, VENTOLIN HFA, PROAIR HFA) 90 mcg/actuation inhaler Take 1 Puff by inhalation every six (6) hours as needed for Wheezing. Indications: BRONCHOSPASM PREVENTION   ??? aspirin (ASPIRIN) 325 mg tablet Take 325 mg by mouth daily.    ??? ascorbic acid, vitamin C, (VITAMIN C) 500 mg tablet Take  by mouth.   ??? vitamin E (AQUA GEMS) 400 unit capsule Take 1,000 Units by mouth daily.   ??? acetaminophen (TYLENOL) 500 mg tablet Take  by mouth every six (6) hours as needed for Pain.   ??? SODIUM CHLORIDE (SALINE NASAL NA) by Nasal route as needed. 2 sprays each nostril every 12 hours.    ??? multivitamin with iron (DAILY MULTI-VITAMINS/IRON) tablet Take 1 Tab by mouth daily.   ??? Cholecalciferol, Vitamin D3, (VITAMIN D3) 1,000 unit cap Take 5,000 Units by mouth daily.   ??? cyanocobalamin 1,000 mcg tablet Take 2,500 mcg by mouth every Monday and Thursday.   ??? omega-3 fatty acids-vitamin e (FISH OIL) 1,000 mg cap Take 1 Cap by mouth four (4) times daily.   ??? AZO CRANBERRY 450-30-50 mg-mg-million tab Take  by mouth.     No current facility-administered medications for this visit.        Social History     Social History   ??? Marital status: MARRIED     Spouse name: N/A   ??? Number of children: N/A   ??? Years of education: N/A     Social History Main Topics   ??? Smoking status: Current Every Day Smoker     Packs/day: 1.00   ??? Smokeless tobacco: Never Used   ??? Alcohol use No   ??? Drug use: No   ??? Sexual activity: Yes     Partners: Male     Other Topics Concern   ??? None     Social History Narrative       Family History   Problem Relation Age of Onset   ??? Heart Disease Mother    ??? Other Father      AAA         Physical Exam:  Visit Vitals   ??? BP 109/71 (BP 1 Location: Left arm, BP Patient Position: At rest)   ??? Pulse 80   ??? Temp 97.6 ??F (36.4 ??C) (Axillary)   ??? Resp 20   ??? Ht 5\' 6"  (1.676 m)   ??? Wt 169 lb (76.7 kg)   ??? SpO2 95%   ??? BMI 27.28 kg/m2     Physical Exam   Constitutional: She is oriented to person, place, and time and well-developed, well-nourished,  and in no distress.   HENT:   Head: Normocephalic and atraumatic.   Right Ear: External ear normal.   Left Ear: External ear normal.   Mouth/Throat: Oropharynx is clear and moist.   Eyes: Conjunctivae are normal.    Neck: Neck supple.   Cardiovascular: Normal rate and regular rhythm.    Pulmonary/Chest: Effort normal and breath sounds normal.   Musculoskeletal: She exhibits no edema.   Lymphadenopathy:     She has no cervical adenopathy.   Neurological: She is alert and oriented to person, place, and time. Gait normal.   Skin: Skin is warm and dry.   See foot exam.  Overall unremarkable CSM intact, however, toe nails are thick (unable to see color due to nail polish) right toe nail with about half nail present from nail bed. A sharp corner of nail growing to side. No sxs of other infection, no redness/swelling/ttp.   Psychiatric: Mood and affect normal.   Nursing note and vitals reviewed.      Assessment/Plan:    ICD-10-CM ICD-9-CM    1. Essential hypertension I10 401.9 amLODIPine (NORVASC) 10 mg tablet      hydroCHLOROthiazide (HYDRODIURIL) 25 mg tablet        - doing well. Will cont current regimen.       URINALYSIS W/ RFLX MICROSCOPIC      PR HANDLG&/OR CONVEY OF SPEC FOR TR OFFICE TO LAB      COLLECTION VENOUS BLOOD,VENIPUNCTURE   2. Hyperlipidemia, unspecified hyperlipidemia type E78.5 272.4 atorvastatin (LIPITOR) 40 mg tablet  -pt denies being told to start 80 mg previously. Would like to cont at 40 mg until updated lab result.      LIPID PANEL   3. Anxiety F41.9 300.00 FLUoxetine (PROZAC) 20 mg capsule   4. Thyroid disease E07.9 246.9 levothyroxine (SYNTHROID) 50 mcg tablet   5. Type 2 diabetes mellitus without complication, without long-term current use of insulin (HCC) E11.9 250.00 metFORMIN (GLUCOPHAGE) 1,000 mg tablet      HM DIABETES FOOT EXAM -wnl except nails. rec she f/u podiatry for clipping and foot care.      REFERRAL TO PODIATRY      HEMOGLOBIN A1C WITH EAG   6. Wheeze R06.2 786.07 albuterol (PROVENTIL HFA, VENTOLIN HFA, PROAIR HFA) 90 mcg/actuation inhaler- COPD. No current wheeze/abnl lungs sounds.   7. Pulmonary emphysema, unspecified emphysema type (HCC) J43.9 492.8  albuterol (PROVENTIL HFA, VENTOLIN HFA, PROAIR HFA) 90 mcg/actuation inhaler   8. Onychomycosis B35.1 110.1 REFERRAL TO PODIATRY   9. Postmenopausal Z78.0 V49.81 DEXA BONE DENSITY STUDY AXIAL   10. Colon cancer screening Z12.11 V76.51 OCCULT BLOOD IMMUNOASSAY,DIAGNOSTIC     Reiterated LMs.  Will continue current regimen.    Follow-up Disposition:  Return in about 3 months (around 08/07/2017), or sooner prn, or if symptoms worsen or fail to improve.  Seek immediate care for any new or alarm sxs in interim.    Total time spent with the patient today was 25 minutes of which more than 50% was spent face to face with the patient.    Marlan Palau, PA-C

## 2017-05-09 ENCOUNTER — Encounter

## 2017-05-09 LAB — URINALYSIS W/ RFLX MICROSCOPIC
Bilirubin: NEGATIVE
Blood: NEGATIVE
Glucose: NEGATIVE
Ketone: NEGATIVE
Leukocyte Esterase: NEGATIVE
Nitrites: NEGATIVE
Protein: NEGATIVE
Specific Gravity: 1.017 (ref 1.005–1.030)
Urobilinogen: 0.2 mg/dL (ref 0.2–1.0)
pH (UA): 5.5 (ref 5.0–7.5)

## 2017-05-09 LAB — LIPID PANEL
Cholesterol, total: 112 mg/dL (ref 100–199)
HDL Cholesterol: 32 mg/dL — ABNORMAL LOW (ref >39)
LDL, calculated: 29 mg/dL (ref 0–99)
Triglyceride: 256 mg/dL — ABNORMAL HIGH (ref 0–149)
VLDL, calculated: 51 mg/dL — ABNORMAL HIGH (ref 5–40)

## 2017-05-09 LAB — HEMOGLOBIN A1C WITH EAG
Estimated average glucose: 186 mg/dL
Hemoglobin A1c: 8.1 % — ABNORMAL HIGH (ref 4.8–5.6)

## 2017-05-09 LAB — CVD REPORT

## 2017-05-09 MED ORDER — METFORMIN 1,000 MG TAB
1000 mg | ORAL_TABLET | ORAL | 2 refills | Status: DC
Start: 2017-05-09 — End: 2018-01-20

## 2017-05-09 MED ORDER — FLUOXETINE 20 MG CAP
20 mg | ORAL_CAPSULE | ORAL | 0 refills | Status: DC
Start: 2017-05-09 — End: 2017-11-18

## 2017-05-09 NOTE — Progress Notes (Signed)
Dr. Abran CantorFrye calls back with pt's confirmed appointment time of 05/17/2017 at 10:00 am. Dr. Abran CantorFrye phone is [phone]. [address] [place] [id number].

## 2017-05-09 NOTE — Telephone Encounter (Signed)
Fax from [facility] requesting 90 d. Rx refilled.

## 2017-05-15 MED ORDER — SITAGLIPTIN 100 MG TAB
100 mg | ORAL_TABLET | Freq: Every day | ORAL | 2 refills | Status: DC
Start: 2017-05-15 — End: 2018-01-10

## 2017-05-15 MED ORDER — ATORVASTATIN 80 MG TAB
80 mg | ORAL_TABLET | Freq: Every day | ORAL | 0 refills | Status: DC
Start: 2017-05-15 — End: 2017-08-23

## 2017-05-15 NOTE — Progress Notes (Signed)
Discussed results with pt.  Trig still elev. Should avoid foods that are high in chol and trans/saturated fats.  rec increase atorvastatin to 80 mg.  Also min improvement on A1c. Discussed adding second agent and pt agrees. Will try on and will recheck fasting labs at f/u in Dec.

## 2017-05-15 NOTE — Addendum Note (Signed)
Addended by: Marlan PalauJENKINS, Javan Gonzaga S on: 05/15/2017 10:36 AM      Modules accepted: Orders

## 2017-05-29 ENCOUNTER — Ambulatory Visit
Admit: 2017-05-29 | Discharge: 2017-05-29 | Payer: PRIVATE HEALTH INSURANCE | Attending: Physician Assistant | Primary: Physician Assistant

## 2017-05-29 DIAGNOSIS — N390 Urinary tract infection, site not specified: Secondary | ICD-10-CM

## 2017-05-29 LAB — AMB POC URINALYSIS DIP STICK MANUAL W/O MICRO
Bilirubin (UA POC): NEGATIVE
Blood (UA POC): NEGATIVE
Glucose (UA POC): NEGATIVE
Ketones (UA POC): NEGATIVE
Nitrites (UA POC): NEGATIVE
Protein (UA POC): NEGATIVE
Specific gravity (UA POC): 1.01 (ref 1.001–1.035)
Urobilinogen (UA POC): 0.2 (ref 0.2–1)
pH (UA POC): 5.5 (ref 4.6–8.0)

## 2017-05-29 MED ORDER — NITROFURANTOIN (25% MACROCRYSTAL FORM) 100 MG CAP
100 mg | ORAL_CAPSULE | Freq: Two times a day (BID) | ORAL | 0 refills | Status: AC
Start: 2017-05-29 — End: 2017-06-03

## 2017-05-29 MED ORDER — PHENAZOPYRIDINE 200 MG TAB
200 mg | ORAL_TABLET | Freq: Three times a day (TID) | ORAL | 0 refills | Status: AC | PRN
Start: 2017-05-29 — End: 2017-05-31

## 2017-05-29 NOTE — Progress Notes (Signed)
Madison Peters is a 66 y.o. female who presents to the office today with the following:  Chief Complaint   Patient presents with   ??? Bladder Infection     burning when she urinates and back pain       HPI   Burning with urination since Sat x 4 d ago.  Also with frequency. Urgency on Sat, but not today.  Taking cranberry pills.  Back started hurting a little this AM.   No fever/chills, abd pain, or other GU/GI sxs.  Denies other infectious concerns.  Otherwise feeling okay.     Also reports went to Podiatrist Dr. Abran Cantor as referred and had a good experience.  Goes back in 58mo.    Review of Systems   Constitutional: Negative for chills, fever and malaise/fatigue.   Gastrointestinal: Negative.  Negative for abdominal pain, diarrhea, nausea and vomiting.   Genitourinary: Positive for dysuria, flank pain and hematuria ("saw a dab" of blood on TP). Negative for frequency and urgency.   Musculoskeletal: Positive for back pain.     See HPI.    Past Medical History:   Diagnosis Date   ??? Anxiety    ??? Diabetes (HCC)    ??? Dyspepsia    ??? Emphysema    ??? Headache(784.0)    ??? Hypertension    ??? IGT (impaired glucose tolerance)    ??? Kidney stones    ??? Lipoma     Left calf   ??? Thyroid disease     hypothryoidism       Past Surgical History:   Procedure Laterality Date   ??? HX HEENT      Tonsillectomy at age 41       Allergies   Allergen Reactions   ??? Ace Inhibitors Cough   ??? Amoxicillin Nausea and Vomiting   ??? Erythromycin Other (comments)     Does not remember.   ??? Sulfa (Sulfonamide Antibiotics) Nausea and Vomiting       Current Outpatient Prescriptions   Medication Sig   ??? phenazopyridine (PYRIDIUM) 200 mg tablet Take 1 Tab by mouth three (3) times daily as needed for Pain for up to 2 days.   ??? nitrofurantoin, macrocrystal-monohydrate, (MACROBID) 100 mg capsule Take 1 Cap by mouth two (2) times a day for 5 days.   ??? atorvastatin (LIPITOR) 80 mg tablet Take 1 Tab by mouth daily.    ??? FLUoxetine (PROZAC) 20 mg capsule TAKE ONE CAPSULE BY MOUTH TWICE A DAY   ??? metFORMIN (GLUCOPHAGE) 1,000 mg tablet TAKE 1&1/2 TABLETS EVERY MORNING AND 1 IN THE EVENING WITH A MEAL   ??? cranberry extract 450 mg tab tablet Take 450 mg by mouth.   ??? amLODIPine (NORVASC) 10 mg tablet Take 1 Tab by mouth daily.   ??? hydroCHLOROthiazide (HYDRODIURIL) 25 mg tablet TAKE 1 TABLET BY MOUTH EVERY DAY   ??? levothyroxine (SYNTHROID) 50 mcg tablet TAKE 1 TABLET BY MOUTH EVERY DAY   ??? metoprolol succinate (TOPROL-XL) 50 mg XL tablet TAKE 1 TABLET BY MOUTH EVERY DAY   ??? albuterol (PROVENTIL HFA, VENTOLIN HFA, PROAIR HFA) 90 mcg/actuation inhaler Take 1 Puff by inhalation every six (6) hours as needed for Wheezing. Indications: BRONCHOSPASM PREVENTION   ??? AZO CRANBERRY 450-30-50 mg-mg-million tab Take  by mouth.   ??? aspirin (ASPIRIN) 325 mg tablet Take 325 mg by mouth daily.   ??? ascorbic acid, vitamin C, (VITAMIN C) 500 mg tablet Take  by mouth.   ??? vitamin E (AQUA  GEMS) 400 unit capsule Take 1,000 Units by mouth daily.   ??? acetaminophen (TYLENOL) 500 mg tablet Take  by mouth every six (6) hours as needed for Pain.   ??? SODIUM CHLORIDE (SALINE NASAL NA) by Nasal route as needed. 2 sprays each nostril every 12 hours.    ??? multivitamin with iron (DAILY MULTI-VITAMINS/IRON) tablet Take 1 Tab by mouth daily.   ??? Cholecalciferol, Vitamin D3, (VITAMIN D3) 1,000 unit cap Take 5,000 Units by mouth daily.   ??? cyanocobalamin 1,000 mcg tablet Take 2,500 mcg by mouth every Monday and Thursday.   ??? omega-3 fatty acids-vitamin e (FISH OIL) 1,000 mg cap Take 1 Cap by mouth four (4) times daily.   ??? SITagliptin (JANUVIA) 100 mg tablet Take 1 Tab by mouth daily.     No current facility-administered medications for this visit.        Social History     Social History   ??? Marital status: MARRIED     Spouse name: N/A   ??? Number of children: N/A   ??? Years of education: N/A     Social History Main Topics   ??? Smoking status: Current Every Day Smoker      Packs/day: 1.00   ??? Smokeless tobacco: Never Used   ??? Alcohol use No   ??? Drug use: No   ??? Sexual activity: Yes     Partners: Male     Other Topics Concern   ??? None     Social History Narrative       Family History   Problem Relation Age of Onset   ??? Heart Disease Mother    ??? Other Father      AAA         Physical Exam:  Visit Vitals   ??? BP 103/57 (BP 1 Location: Left arm, BP Patient Position: Sitting)   ??? Pulse 77   ??? Temp 98 ??F (36.7 ??C) (Oral)   ??? Resp 16   ??? Ht  (1.676 m)   ??? Wt 170 lb (77.1 kg)   ??? SpO2 96%   ??? BMI 27.44 kg/m2     Physical Exam   Constitutional: She is oriented to person, place, and time and well-developed, well-nourished, and in no distress.   HENT:   Head: Normocephalic and atraumatic.   Eyes: Conjunctivae are normal.   Cardiovascular: Normal rate and regular rhythm.    Pulmonary/Chest: Effort normal and breath sounds normal.   Abdominal: Soft. Normal appearance. She exhibits no distension and no mass. There is no hepatosplenomegaly. There is no tenderness. There is CVA tenderness (right flank is w. mild pain to palpation, but also some superifical MS tenderness along lower thoracic paraspinal mm.). There is no rigidity, no rebound, no guarding, no tenderness at McBurney's point and negative Murphy's sign.   Neurological: She is alert and oriented to person, place, and time. Gait normal.   Skin: Skin is warm and dry.   Psychiatric: Mood and affect normal.   Nursing note and vitals reviewed.      Assessment/Plan:    ICD-10-CM ICD-9-CM    1. Urinary tract infection with hematuria, site unspecified N39.0 599.0 nitrofurantoin, macrocrystal-monohydrate, (MACROBID) 100 mg capsule    R31.9  CULTURE, URINE   2. Burning with urination R30.0 788.1 AMB POC URINALYSIS DIP STICK MANUAL W/O MICRO      phenazopyridine (PYRIDIUM) 200 mg tablet     Results for orders placed or performed in visit on 05/29/17  AMB POC URINALYSIS DIP STICK MANUAL W/O MICRO   Result Value Ref Range     Color (UA POC) Yellow     Clarity (UA POC) Clear     Glucose (UA POC) Negative Negative    Bilirubin (UA POC) Negative Negative    Ketones (UA POC) Negative Negative    Specific gravity (UA POC) 1.010 1.001 - 1.035    Blood (UA POC) Negative Negative    pH (UA POC) 5.5 4.6 - 8.0    Protein (UA POC) Negative Negative    Urobilinogen (UA POC) 0.2 mg/dL 0.2 - 1    Nitrites (UA POC) Negative Negative    Leukocyte esterase (UA POC) Trace Negative   pt unable to leave enough urine for culture. Will return to pick up medication at pharmacy and leave specimen. *addendum- pt returned to leave specimen.    Instructed to push fluids (ie water/cranberry juice). Avoid caffeine and carbonated bvgs.  Counseled on otc medications for sxs relief.   Educational handout provided with home care instructions.  Instructed to RTO/seek medical attn if sxs persist/worsen.    Follow-up Disposition:  Return if symptoms worsen or fail to improve.    Marlan PalauSarah S. Jenkins, PA-C

## 2017-05-29 NOTE — Patient Instructions (Addendum)
Painful Urination (Dysuria): Care Instructions  Your Care Instructions  Burning pain with urination (dysuria) is a common symptom of a urinary tract infection or other urinary problems. The bladder may become inflamed. This can cause pain when the bladder fills and empties. You may also feel pain if the tube that carries urine from the bladder to the outside of the body (urethra) gets irritated or infected.  Sexually transmitted infections (STIs) also may cause pain when you urinate.  Sometimes the pain can be caused by things other than an infection. The urethra can be irritated by soaps, perfumes, or foreign objects in the urethra. Kidney stones can cause pain when they pass through the urethra.  The cause may be hard to find. You may need tests. Treatment for painful urination depends on the cause.  Follow-up care is a key part of your treatment and safety. Be sure to make and go to all appointments, and call your doctor if you are having problems. It's also a good idea to know your test results and keep a list of the medicines you take.  How can you care for yourself at home?  ?? Drink extra water for the next day or two. This will help make the urine less concentrated. (If you have kidney, heart, or liver disease and have to limit fluids, talk with your doctor before you increase the amount of fluids you drink.)  ?? Avoid drinks that are carbonated or have caffeine. They can irritate the bladder.  ?? Urinate often. Try to empty your bladder each time.  For women:  ?? Urinate right after you have sex.  ?? After going to the bathroom, wipe from front to back.  ?? Avoid douches, bubble baths, and feminine hygiene sprays. And avoid other feminine hygiene products that have deodorants.  When should you call for help?  Call your doctor now or seek immediate medical care if:  ?? ?? You have new symptoms, such as fever, nausea, or vomiting.   ?? ?? You have new or worse symptoms of a urinary problem. For example:   ?? You have blood or pus in your urine.  ?? You have chills or body aches.  ?? It hurts worse to urinate.  ?? You have groin or belly pain.  ?? You have pain in your back just below your rib cage (the flank area).   ??Watch closely for changes in your health, and be sure to contact your doctor if you have any problems.  Where can you learn more?  Go to http://www.healthwise.net/[employer]Help[employer]ctions.  Enter H814 in the search box to learn more about "Painful Urination (Dysuria): Care Instructions."  Current as of: Jan 13, 2016  Content Version: 11.7  ?? 2006-2018 [employer]. Care instructions adapted under license by [employer] (which disclaims liability or warranty for this information). If you have questions about a medical condition or this instruction, always ask your healthcare professional. [employer] disclaims any warranty or liability for your use of this information.       Urinary Tract Infection in Women: Care Instructions  Your Care Instructions    A urinary tract infection, or UTI, is a general term for an infection anywhere between the kidneys and the urethra (where urine comes out). Most UTIs are bladder infections. They often cause pain or burning when you urinate.  UTIs are caused by bacteria and can be cured with antibiotics. Be sure to complete your treatment so that the infection goes away.  Follow-up care   is a key part of your treatment and safety. Be sure to make and go to all appointments, and call your doctor if you are having problems. It's also a good idea to know your test results and keep a list of the medicines you take.  How can you care for yourself at home?  ?? Take your antibiotics as directed. Do not stop taking them just because you feel better. You need to take the full course of antibiotics.  ?? Drink extra water and other fluids for the next day or two. This may help wash out the bacteria that are causing the infection. (If you have  kidney, heart, or liver disease and have to limit fluids, talk with your doctor before you increase your fluid intake.)  ?? Avoid drinks that are carbonated or have caffeine. They can irritate the bladder.  ?? Urinate often. Try to empty your bladder each time.  ?? To relieve pain, take a hot bath or lay a heating pad set on low over your lower belly or genital area. Never go to sleep with a heating pad in place.  To prevent UTIs  ?? Drink plenty of water each day. This helps you urinate often, which clears bacteria from your system. (If you have kidney, heart, or liver disease and have to limit fluids, talk with your doctor before you increase your fluid intake.)  ?? Urinate when you need to.  ?? Urinate right after you have sex.  ?? Change sanitary pads often.  ?? Avoid douches, bubble baths, feminine hygiene sprays, and other feminine hygiene products that have deodorants.  ?? After going to the bathroom, wipe from front to back.  When should you call for help?  Call your doctor now or seek immediate medical care if:  ?? ?? Symptoms such as fever, chills, nausea, or vomiting get worse or appear for the first time.   ?? ?? You have new pain in your back just below your rib cage. This is called flank pain.   ?? ?? There is new blood or pus in your urine.   ?? ?? You have any problems with your antibiotic medicine.   ??Watch closely for changes in your health, and be sure to contact your doctor if:  ?? ?? You are not getting better after taking an antibiotic for 2 days.   ?? ?? Your symptoms go away but then come back.   Where can you learn more?  Go to http://www.healthwise.net/GoodHelpConnections.  Enter K848 in the search box to learn more about "Urinary Tract Infection in Women: Care Instructions."  Current as of: Jan 13, 2016  Content Version: 11.7  ?? 2006-2018 [employer]. Care instructions adapted under license by [employer] (which disclaims liability or warranty  for this information). If you have questions about a medical condition or this instruction, always ask your healthcare professional. [employer] disclaims any warranty or liability for your use of this information.

## 2017-05-31 LAB — CULTURE, URINE

## 2017-06-03 NOTE — Progress Notes (Signed)
Notify pt urine culture came back showing e.coli but abx should work.

## 2017-06-05 NOTE — Progress Notes (Signed)
[facility] to give lab results.

## 2017-06-13 NOTE — Progress Notes (Signed)
Mailed letter w/result

## 2017-06-26 ENCOUNTER — Ambulatory Visit: Payer: BLUE CROSS/BLUE SHIELD | Primary: Physician Assistant

## 2017-08-07 ENCOUNTER — Encounter: Attending: Physician Assistant | Primary: Physician Assistant

## 2017-08-23 ENCOUNTER — Encounter

## 2017-08-23 MED ORDER — ATORVASTATIN 80 MG TAB
80 mg | ORAL_TABLET | ORAL | 0 refills | Status: DC
Start: 2017-08-23 — End: 2018-01-20

## 2017-09-05 ENCOUNTER — Encounter: Attending: Physician Assistant | Primary: Physician Assistant

## 2017-11-18 ENCOUNTER — Encounter

## 2017-11-18 MED ORDER — FLUOXETINE 20 MG CAP
20 mg | ORAL_CAPSULE | ORAL | 0 refills | Status: DC
Start: 2017-11-18 — End: 2018-01-20

## 2017-11-18 NOTE — Telephone Encounter (Signed)
I have called and left a message for this patient to return my call to discuss this refill request and need for follow up apt.

## 2017-11-18 NOTE — Telephone Encounter (Signed)
Pt due for her f/u apt

## 2017-11-18 NOTE — Telephone Encounter (Signed)
Patient returns my call, I have discussed this refill and need for a follow up.  Patient will call back to make an appointment later.

## 2018-01-10 ENCOUNTER — Ambulatory Visit: Admit: 2018-01-10 | Discharge: 2018-01-10 | Attending: Physician Assistant | Primary: Physician Assistant

## 2018-01-10 DIAGNOSIS — N390 Urinary tract infection, site not specified: Secondary | ICD-10-CM

## 2018-01-10 LAB — AMB POC URINALYSIS DIP STICK MANUAL W/O MICRO
Bilirubin (UA POC): NEGATIVE
Glucose (UA POC): NEGATIVE
Nitrites (UA POC): NEGATIVE
Specific gravity (UA POC): 1.03 (ref 1.001–1.035)
Urobilinogen (UA POC): 0.2 (ref 0.2–1)
pH (UA POC): 5.5 (ref 4.6–8.0)

## 2018-01-10 MED ORDER — NITROFURANTOIN (25% MACROCRYSTAL FORM) 100 MG CAP
100 mg | ORAL_CAPSULE | Freq: Two times a day (BID) | ORAL | 0 refills | Status: AC
Start: 2018-01-10 — End: 2018-01-17

## 2018-01-10 MED ORDER — PHENAZOPYRIDINE 200 MG TAB
200 mg | ORAL_TABLET | Freq: Three times a day (TID) | ORAL | 0 refills | Status: AC | PRN
Start: 2018-01-10 — End: 2018-01-12

## 2018-01-10 NOTE — Progress Notes (Signed)
Notify pt urine culture positive for ecoli, abx should help.  Keep planned f/u.

## 2018-01-10 NOTE — Progress Notes (Signed)
Madison Peters is a 67 y.o. female who presents to the office today with the following:  Chief Complaint   Patient presents with   ??? Dysuria       HPI  Pt suspects UTI.  Dysuria and lower abdomen pressure/ache started Tues x 3 d ago.  Started cranberry juice, then pills.  Now when urinates does not hurt in abdomen, but burns badly after.  Also with frequency and urgency at first that got better w/ water.  Did well on Macrobid last time. Last culture was e.coli and was susceptible.  Denies fever/chills or back pain.  Otherwise feeling well with no other complaints or acute concerns.    Review of Systems   Constitutional: Negative for chills, fever and malaise/fatigue.   Gastrointestinal: Negative for diarrhea, nausea and vomiting.   Genitourinary: Positive for dysuria, frequency and urgency. Negative for flank pain and hematuria.   Musculoskeletal: Negative for myalgias.     See HPI.    Past Medical History:   Diagnosis Date   ??? Anxiety    ??? Diabetes (HCC)    ??? Dyspepsia    ??? Emphysema    ??? Headache(784.0)    ??? Hypertension    ??? IGT (impaired glucose tolerance)    ??? Kidney stones    ??? Lipoma     Left calf   ??? Thyroid disease     hypothryoidism       Past Surgical History:   Procedure Laterality Date   ??? HX HEENT      Tonsillectomy at age 72       Allergies   Allergen Reactions   ??? Ace Inhibitors Cough   ??? Amoxicillin Nausea and Vomiting   ??? Erythromycin Other (comments)     Does not remember.   ??? Sulfa (Sulfonamide Antibiotics) Nausea and Vomiting       Current Outpatient Medications   Medication Sig   ??? nitrofurantoin, macrocrystal-monohydrate, (MACROBID) 100 mg capsule Take 1 Cap by mouth two (2) times a day for 7 days.   ??? phenazopyridine (PYRIDIUM) 200 mg tablet Take 1 Tab by mouth three (3) times daily as needed for Pain for up to 2 days.   ??? FLUoxetine (PROZAC) 20 mg capsule TAKE 1 CAPSULE BY MOUTH TWICE A DAY   ??? atorvastatin (LIPITOR) 80 mg tablet TAKE 1 TABLET BY MOUTH EVERY DAY    ??? metFORMIN (GLUCOPHAGE) 1,000 mg tablet TAKE 1&1/2 TABLETS EVERY MORNING AND 1 IN THE EVENING WITH A MEAL   ??? cranberry extract 450 mg tab tablet Take 450 mg by mouth.   ??? amLODIPine (NORVASC) 10 mg tablet Take 1 Tab by mouth daily.   ??? hydroCHLOROthiazide (HYDRODIURIL) 25 mg tablet TAKE 1 TABLET BY MOUTH EVERY DAY   ??? levothyroxine (SYNTHROID) 50 mcg tablet TAKE 1 TABLET BY MOUTH EVERY DAY   ??? metoprolol succinate (TOPROL-XL) 50 mg XL tablet TAKE 1 TABLET BY MOUTH EVERY DAY   ??? albuterol (PROVENTIL HFA, VENTOLIN HFA, PROAIR HFA) 90 mcg/actuation inhaler Take 1 Puff by inhalation every six (6) hours as needed for Wheezing. Indications: BRONCHOSPASM PREVENTION   ??? AZO CRANBERRY 450-30-50 mg-mg-million tab Take  by mouth.   ??? aspirin (ASPIRIN) 325 mg tablet Take 325 mg by mouth daily.   ??? ascorbic acid, vitamin C, (VITAMIN C) 500 mg tablet Take  by mouth.   ??? vitamin E (AQUA GEMS) 400 unit capsule Take 1,000 Units by mouth daily.   ??? acetaminophen (TYLENOL) 500 mg tablet Take  by mouth every six (6) hours as needed for Pain.   ??? SODIUM CHLORIDE (SALINE NASAL NA) by Nasal route as needed. 2 sprays each nostril every 12 hours.    ??? multivitamin with iron (DAILY MULTI-VITAMINS/IRON) tablet Take 1 Tab by mouth daily.   ??? Cholecalciferol, Vitamin D3, (VITAMIN D3) 1,000 unit cap Take 5,000 Units by mouth daily.   ??? cyanocobalamin 1,000 mcg tablet Take 2,500 mcg by mouth every Monday and Thursday.   ??? omega-3 fatty acids-vitamin e (FISH OIL) 1,000 mg cap Take 1 Cap by mouth four (4) times daily.     No current facility-administered medications for this visit.        Social History     Socioeconomic History   ??? Marital status: MARRIED     Spouse name: Not on file   ??? Number of children: Not on file   ??? Years of education: Not on file   ??? Highest education level: Not on file   Tobacco Use   ??? Smoking status: Current Every Day Smoker     Packs/day: 1.00   ??? Smokeless tobacco: Never Used   Substance and Sexual Activity    ??? Alcohol use: No   ??? Drug use: No   ??? Sexual activity: Yes     Partners: Male       Family History   Problem Relation Age of Onset   ??? Heart Disease Mother    ??? Other Father         AAA         Physical Exam:  Visit Vitals  BP 120/60 (BP 1 Location: Left arm, BP Patient Position: Sitting)   Pulse 96 Comment: manual recheck   Temp 97.4 ??F (36.3 ??C) (Oral)   Resp 16   Ht  (1.676 m)   Wt 169 lb 3.2 oz (76.7 kg)   SpO2 96%   BMI 27.31 kg/m??     Physical Exam   Constitutional: She is oriented to person, place, and time and well-developed, well-nourished, and in no distress.   HENT:   Head: Normocephalic and atraumatic.   Eyes: Conjunctivae are normal.   Cardiovascular: Normal rate and regular rhythm.   Pulmonary/Chest: Effort normal. She has wheezes (faint, transient (pt no complaints)).   Abdominal: Soft. She exhibits no distension and no mass. There is no hepatosplenomegaly. There is no tenderness. There is no rigidity, no rebound, no guarding, no CVA tenderness, no tenderness at McBurney's point and negative Murphy's sign.   Neurological: She is alert and oriented to person, place, and time. Gait normal.   Skin: Skin is warm and dry.   Psychiatric: Mood and affect normal.   Nursing note and vitals reviewed.      Assessment/Plan:    ICD-10-CM ICD-9-CM    1. Urinary tract infection with hematuria, site unspecified N39.0 599.0 CULTURE, URINE    R31.9 599.70 nitrofurantoin, macrocrystal-monohydrate, (MACROBID) 100 mg capsule   2. Dysuria R30.0 788.1 AMB POC URINALYSIS DIP STICK MANUAL W/O MICRO      nitrofurantoin, macrocrystal-monohydrate, (MACROBID) 100 mg capsule      phenazopyridine (PYRIDIUM) 200 mg tablet     Results for orders placed or performed in visit on 01/10/18   AMB POC URINALYSIS DIP STICK MANUAL W/O MICRO   Result Value Ref Range    Color (UA POC) Yellow     Clarity (UA POC) Clear     Glucose (UA POC) Negative Negative    Bilirubin (UA POC) Negative Negative  Ketones (UA POC) Trace Negative     Specific gravity (UA POC) 1.030 1.001 - 1.035    Blood (UA POC) Trace Negative    pH (UA POC) 5.5 4.6 - 8.0    Protein (UA POC) 2+ Negative    Urobilinogen (UA POC) 0.2 mg/dL 0.2 - 1    Nitrites (UA POC) Negative Negative    Leukocyte esterase (UA POC) 1+ Negative       Follow-up and Dispositions    ?? Return if symptoms worsen or fail to improve.      Instructed to push fluids (ie water/cranberry juice). Avoid caffeine and carbonated bvgs.  Counseled on otc medications for sxs relief.   Educational handout provided with home care instructions.  Instructed to RTO/seek medical attn if sxs persist/worsen.    Pt plans to f/u in about 1 week for FBW/routine f/u apt she is overdue for.  Seek care in interim for any new sxs or other concerns.  Pt verbalizes understanding and agrees with the plan.    Marlan Palau, PA-C

## 2018-01-10 NOTE — Addendum Note (Signed)
Addended by: Essie Christine on: 01/10/2018 01:19 PM     Modules accepted: Orders

## 2018-01-10 NOTE — Patient Instructions (Signed)
Painful Urination (Dysuria): Care Instructions  Your Care Instructions  Burning pain with urination (dysuria) is a common symptom of a urinary tract infection or other urinary problems. The bladder may become inflamed. This can cause pain when the bladder fills and empties. You may also feel pain if the tube that carries urine from the bladder to the outside of the body (urethra) gets irritated or infected.  Sexually transmitted infections (STIs) also may cause pain when you urinate.  Sometimes the pain can be caused by things other than an infection. The urethra can be irritated by soaps, perfumes, or foreign objects in the urethra. Kidney stones can cause pain when they pass through the urethra.  The cause may be hard to find. You may need tests. Treatment for painful urination depends on the cause.  Follow-up care is a key part of your treatment and safety. Be sure to make and go to all appointments, and call your doctor if you are having problems. It's also a good idea to know your test results and keep a list of the medicines you take.  How can you care for yourself at home?  ?? Drink extra water for the next day or two. This will help make the urine less concentrated. (If you have kidney, heart, or liver disease and have to limit fluids, talk with your doctor before you increase the amount of fluids you drink.)  ?? Avoid drinks that are carbonated or have caffeine. They can irritate the bladder.  ?? Urinate often. Try to empty your bladder each time.  For women:  ?? Urinate right after you have sex.  ?? After going to the bathroom, wipe from front to back.  ?? Avoid douches, bubble baths, and feminine hygiene sprays. And avoid other feminine hygiene products that have deodorants.  When should you call for help?  Call your doctor now or seek immediate medical care if:  ?? ?? You have new symptoms, such as fever, nausea, or vomiting.   ?? ?? You have new or worse symptoms of a urinary problem. For example:   ? You have blood or pus in your urine.  ? You have chills or body aches.  ? It hurts worse to urinate.  ? You have groin or belly pain.  ? You have pain in your back just below your rib cage (the flank area).   ??Watch closely for changes in your health, and be sure to contact your doctor if you have any problems.  Where can you learn more?  Go to http://www.healthwise.net/GoodHelp[employer]ctions.  Enter H814 in the search box to learn more about "Painful Urination (Dysuria): Care Instructions."  Current as of: November 20, 2016  Content Version: 11.9  ?? 2006-2018 [employer]. Care instructions adapted under license by [employer] (which disclaims liability or warranty for this information). If you have questions about a medical condition or this instruction, always ask your healthcare professional. [employer] disclaims any warranty or liability for your use of this information.         Urinary Tract Infection in Women: Care Instructions  Your Care Instructions    A urinary tract infection, or UTI, is a general term for an infection anywhere between the kidneys and the urethra (where urine comes out). Most UTIs are bladder infections. They often cause pain or burning when you urinate.  UTIs are caused by bacteria and can be cured with antibiotics. Be sure to complete your treatment so that the infection goes away.    Follow-up care is a key part of your treatment and safety. Be sure to make and go to all appointments, and call your doctor if you are having problems. It's also a good idea to know your test results and keep a list of the medicines you take.  How can you care for yourself at home?  ?? Take your antibiotics as directed. Do not stop taking them just because you feel better. You need to take the full course of antibiotics.  ?? Drink extra water and other fluids for the next day or two. This may help wash out the bacteria that are causing the infection. (If you have  kidney, heart, or liver disease and have to limit fluids, talk with your doctor before you increase your fluid intake.)  ?? Avoid drinks that are carbonated or have caffeine. They can irritate the bladder.  ?? Urinate often. Try to empty your bladder each time.  ?? To relieve pain, take a hot bath or lay a heating pad set on low over your lower belly or genital area. Never go to sleep with a heating pad in place.  To prevent UTIs  ?? Drink plenty of water each day. This helps you urinate often, which clears bacteria from your system. (If you have kidney, heart, or liver disease and have to limit fluids, talk with your doctor before you increase your fluid intake.)  ?? Urinate when you need to.  ?? Urinate right after you have sex.  ?? Change sanitary pads often.  ?? Avoid douches, bubble baths, feminine hygiene sprays, and other feminine hygiene products that have deodorants.  ?? After going to the bathroom, wipe from front to back.  When should you call for help?  Call your doctor now or seek immediate medical care if:  ?? ?? Symptoms such as fever, chills, nausea, or vomiting get worse or appear for the first time.   ?? ?? You have new pain in your back just below your rib cage. This is called flank pain.   ?? ?? There is new blood or pus in your urine.   ?? ?? You have any problems with your antibiotic medicine.   ??Watch closely for changes in your health, and be sure to contact your doctor if:  ?? ?? You are not getting better after taking an antibiotic for 2 days.   ?? ?? Your symptoms go away but then come back.   Where can you learn more?  Go to http://www.healthwise.net/GoodHelpConnections.  Enter K848 in the search box to learn more about "Urinary Tract Infection in Women: Care Instructions."  Current as of: November 20, 2016  Content Version: 11.9  ?? 2006-2018 [employer]. Care instructions adapted under license by [employer] (which disclaims liability or warranty  for this information). If you have questions about a medical condition or this instruction, always ask your healthcare professional. [employer] disclaims any warranty or liability for your use of this information.

## 2018-01-10 NOTE — Progress Notes (Signed)
Spoke with patient - not feeling any better, will make appt to come in tomorrow, 01/15/18.

## 2018-01-13 LAB — CULTURE, URINE

## 2018-01-15 ENCOUNTER — Ambulatory Visit: Admit: 2018-01-15 | Discharge: 2018-01-15 | Attending: Physician Assistant | Primary: Physician Assistant

## 2018-01-15 DIAGNOSIS — B9620 Unspecified Escherichia coli [E. coli] as the cause of diseases classified elsewhere: Secondary | ICD-10-CM

## 2018-01-15 LAB — AMB POC URINALYSIS DIP STICK MANUAL W/O MICRO
Blood (UA POC): NEGATIVE
Glucose (UA POC): NEGATIVE
Leukocyte esterase (UA POC): NEGATIVE
Nitrites (UA POC): NEGATIVE
Specific gravity (UA POC): 1.02 (ref 1.001–1.035)
Urobilinogen (UA POC): 0.2 (ref 0.2–1)
pH (UA POC): 5.5 (ref 4.6–8.0)

## 2018-01-15 MED ORDER — PHENAZOPYRIDINE 200 MG TAB
200 mg | ORAL_TABLET | Freq: Three times a day (TID) | ORAL | 0 refills | Status: AC | PRN
Start: 2018-01-15 — End: 2018-01-17

## 2018-01-15 MED ORDER — DOXYCYCLINE 100 MG TAB
100 mg | ORAL_TABLET | Freq: Two times a day (BID) | ORAL | 0 refills | Status: AC
Start: 2018-01-15 — End: 2018-01-25

## 2018-01-15 MED ORDER — ALBUTEROL SULFATE HFA 90 MCG/ACTUATION AEROSOL INHALER
90 mcg/actuation | Freq: Four times a day (QID) | RESPIRATORY_TRACT | 5 refills | Status: DC | PRN
Start: 2018-01-15 — End: 2018-08-08

## 2018-01-15 NOTE — Progress Notes (Signed)
Madison Peters is a 67 y.o. female who presents to the office today with the following:  Chief Complaint   Patient presents with   ??? Urinary Burning     after voiding       HPI  Currently being tx for UTI.  Positive culture for e.coli.  On day 6 Nitrofurantoin. Culture showed susceptible to all abx.  Does not seem to be working as well this time.  Felt "sick as a dog over weekend" and a little better Monday, burning had resolved w/o the phenazopyridine, then sxs returned again yesterday. "Maybe a little better overall"  Still with some lower abdominal discomfort and burning after urination.  She says she has examined herself and has not had any lesions/ulcers/rash.  Denies any other new sxs.  No fever/chills, back pain, n/v or other sxs.    Drinking plenty of water.  Eating healthier, cut out sweets.  Coming back next week for labs.  Also needs refill inhaler for COPD/wheeze- worse this time of year with pollen.    ROS  See HPI.  Positive as noted. Otherwise neg.    Past Medical History:   Diagnosis Date   ??? Anxiety    ??? Diabetes (HCC)    ??? Dyspepsia    ??? Emphysema    ??? Headache(784.0)    ??? Hypertension    ??? IGT (impaired glucose tolerance)    ??? Kidney stones    ??? Lipoma     Left calf   ??? Thyroid disease     hypothryoidism       Past Surgical History:   Procedure Laterality Date   ??? HX HEENT      Tonsillectomy at age 55       Allergies   Allergen Reactions   ??? Ace Inhibitors Cough   ??? Amoxicillin Nausea and Vomiting   ??? Erythromycin Other (comments)     Does not remember.   ??? Sulfa (Sulfonamide Antibiotics) Nausea and Vomiting       Current Outpatient Medications   Medication Sig   ??? doxycycline (ADOXA) 100 mg tablet Take 1 Tab by mouth two (2) times a day for 10 days.   ??? phenazopyridine (PYRIDIUM) 200 mg tablet Take 1 Tab by mouth three (3) times daily as needed for Pain for up to 2 days.   ??? albuterol (PROVENTIL HFA, VENTOLIN HFA, PROAIR HFA) 90 mcg/actuation  inhaler Take 1 Puff by inhalation every six (6) hours as needed for Wheezing. Indications: bronchospasm prevention   ??? nitrofurantoin, macrocrystal-monohydrate, (MACROBID) 100 mg capsule Take 1 Cap by mouth two (2) times a day for 7 days.   ??? FLUoxetine (PROZAC) 20 mg capsule TAKE 1 CAPSULE BY MOUTH TWICE A DAY   ??? atorvastatin (LIPITOR) 80 mg tablet TAKE 1 TABLET BY MOUTH EVERY DAY   ??? metFORMIN (GLUCOPHAGE) 1,000 mg tablet TAKE 1&1/2 TABLETS EVERY MORNING AND 1 IN THE EVENING WITH A MEAL   ??? cranberry extract 450 mg tab tablet Take 450 mg by mouth.   ??? amLODIPine (NORVASC) 10 mg tablet Take 1 Tab by mouth daily.   ??? hydroCHLOROthiazide (HYDRODIURIL) 25 mg tablet TAKE 1 TABLET BY MOUTH EVERY DAY   ??? levothyroxine (SYNTHROID) 50 mcg tablet TAKE 1 TABLET BY MOUTH EVERY DAY   ??? metoprolol succinate (TOPROL-XL) 50 mg XL tablet TAKE 1 TABLET BY MOUTH EVERY DAY   ??? AZO CRANBERRY 450-30-50 mg-mg-million tab Take  by mouth.   ??? aspirin (ASPIRIN) 325 mg tablet Take  325 mg by mouth daily.   ??? ascorbic acid, vitamin C, (VITAMIN C) 500 mg tablet Take  by mouth.   ??? vitamin E (AQUA GEMS) 400 unit capsule Take 1,000 Units by mouth daily.   ??? acetaminophen (TYLENOL) 500 mg tablet Take  by mouth every six (6) hours as needed for Pain.   ??? SODIUM CHLORIDE (SALINE NASAL NA) by Nasal route as needed. 2 sprays each nostril every 12 hours.    ??? multivitamin with iron (DAILY MULTI-VITAMINS/IRON) tablet Take 1 Tab by mouth daily.   ??? Cholecalciferol, Vitamin D3, (VITAMIN D3) 1,000 unit cap Take 5,000 Units by mouth daily.   ??? cyanocobalamin 1,000 mcg tablet Take 2,500 mcg by mouth every Monday and Thursday.   ??? omega-3 fatty acids-vitamin e (FISH OIL) 1,000 mg cap Take 1 Cap by mouth four (4) times daily.     No current facility-administered medications for this visit.        Social History     Socioeconomic History   ??? Marital status: MARRIED     Spouse name: Not on file   ??? Number of children: Not on file    ??? Years of education: Not on file   ??? Highest education level: Not on file   Tobacco Use   ??? Smoking status: Current Every Day Smoker     Packs/day: 1.00   ??? Smokeless tobacco: Never Used   Substance and Sexual Activity   ??? Alcohol use: No   ??? Drug use: No   ??? Sexual activity: Yes     Partners: Male       Family History   Problem Relation Age of Onset   ??? Heart Disease Mother    ??? Other Father         AAA         Physical Exam:  Visit Vitals  BP 128/70 (BP 1 Location: Left arm, BP Patient Position: Sitting)   Pulse 90   Temp 98.1 ??F (36.7 ??C) (Oral)   Resp 16   Ht  (1.676 m)   Wt 169 lb (76.7 kg)   SpO2 96%   BMI 27.28 kg/m??     Physical Exam   Constitutional: She is oriented to person, place, and time and well-developed, well-nourished, and in no distress.   HENT:   Head: Normocephalic and atraumatic.   Eyes: Conjunctivae are normal.   Cardiovascular: Normal rate and regular rhythm.   Pulmonary/Chest: Effort normal and breath sounds normal.   Abdominal: Soft. She exhibits no distension and no mass. There is no hepatosplenomegaly. There is tenderness (mild suprapubic and a little to left). There is no rigidity, no rebound, no guarding, no CVA tenderness, no tenderness at McBurney's point and negative Murphy's sign.   Neurological: She is alert and oriented to person, place, and time. Gait normal.   Skin: Skin is warm and dry.   Psychiatric: Mood and affect normal.   Nursing note and vitals reviewed.      Assessment/Plan:    ICD-10-CM ICD-9-CM    1. E. coli UTI N39.0 599.0 doxycycline (ADOXA) 100 mg tablet    B96.20 041.49    2. Dysuria R30.0 788.1 AMB POC URINALYSIS DIP STICK MANUAL W/O MICRO      phenazopyridine (PYRIDIUM) 200 mg tablet   3. Wheeze R06.2 786.07 albuterol (PROVENTIL HFA, VENTOLIN HFA, PROAIR HFA) 90 mcg/actuation inhaler   4. Pulmonary emphysema, unspecified emphysema type (HCC) J43.9 492.8 albuterol (PROVENTIL HFA, VENTOLIN HFA, PROAIR HFA) 90  mcg/actuation inhaler           Results for orders placed or performed in visit on 01/15/18   AMB POC URINALYSIS DIP STICK MANUAL W/O MICRO   Result Value Ref Range    Color (UA POC) Yellow     Clarity (UA POC) Clear     Glucose (UA POC) Negative Negative    Bilirubin (UA POC) 1+ Negative    Ketones (UA POC) Trace Negative    Specific gravity (UA POC) 1.020 1.001 - 1.035    Blood (UA POC) Negative Negative    pH (UA POC) 5.5 4.6 - 8.0    Protein (UA POC) 1+ Negative    Urobilinogen (UA POC) 0.2 mg/dL 0.2 - 1    Nitrites (UA POC) Negative Negative    Leukocyte esterase (UA POC) Negative Negative     Will try different abx. Refilled phenazopyridine.  Continue other home care and f/u instructions.  Has another apt on Monday.   Agrees to seek care in interim for any new sxs or other concerns.  Pt verbalizes understanding and agrees with the plan.    Marlan Palau, PA-C

## 2018-01-20 ENCOUNTER — Ambulatory Visit: Admit: 2018-01-20 | Discharge: 2018-01-20 | Attending: Physician Assistant | Primary: Physician Assistant

## 2018-01-20 DIAGNOSIS — I10 Essential (primary) hypertension: Secondary | ICD-10-CM

## 2018-01-20 MED ORDER — METFORMIN 1,000 MG TAB
1000 mg | ORAL_TABLET | ORAL | 1 refills | Status: DC
Start: 2018-01-20 — End: 2018-08-09

## 2018-01-20 MED ORDER — HYDROCHLOROTHIAZIDE 25 MG TAB
25 mg | ORAL_TABLET | ORAL | 1 refills | Status: DC
Start: 2018-01-20 — End: 2018-02-11

## 2018-01-20 MED ORDER — LEVOTHYROXINE 50 MCG TAB
50 mcg | ORAL_TABLET | ORAL | 1 refills | Status: DC
Start: 2018-01-20 — End: 2018-08-02

## 2018-01-20 MED ORDER — FLUOXETINE 20 MG CAP
20 mg | ORAL_CAPSULE | ORAL | 1 refills | Status: DC
Start: 2018-01-20 — End: 2018-08-02

## 2018-01-20 MED ORDER — ATORVASTATIN 80 MG TAB
80 mg | ORAL_TABLET | ORAL | 1 refills | Status: DC
Start: 2018-01-20 — End: 2018-08-02

## 2018-01-20 MED ORDER — AMLODIPINE 10 MG TAB
10 mg | ORAL_TABLET | Freq: Every day | ORAL | 1 refills | Status: DC
Start: 2018-01-20 — End: 2018-02-11

## 2018-01-20 MED ORDER — METOPROLOL SUCCINATE SR 50 MG 24 HR TAB
50 mg | ORAL_TABLET | ORAL | 1 refills | Status: DC
Start: 2018-01-20 — End: 2018-02-11

## 2018-01-20 NOTE — Progress Notes (Signed)
D/w pt all of these results.  She now admits she has not been compliant with her Metformin.  Was only taking one of her Metformin daily. Would like to try the full recommended dose.  Says before only did it for a week bc it "made her feel a little funny" and then cut back. She cannot elaborate on those sxs, but says it was mild and she wants to try again. She will let me know if SEs are severe/worrisome or not resolving. We did discuss trying ER if having issues.  Counseled on need to get under control, esp with protein in urine.   Other labs reviewed also.  pts questions/concerns addressed.  She will rtc for repeat in 3 mo, sooner prn.

## 2018-01-20 NOTE — Progress Notes (Signed)
D/w pt all of these results.  She now admits she has not been compliant with her Metformin.  Was only taking one of her Metformin daily. Would like to try the full recommended dose.  Says before only did it for a week bc it "made her feel a little funny" and then cut back. She cannot elaborate on those sxs, but says it was mild and she wants to try again. She will let me know if SEs are severe/worrisome or not resolving. We did discuss trying ER if having issues.  Counseled on need to get under control, esp with protein in urine.   Other labs reviewed also.  pts questions/concerns addressed.  She will rtc for repeat in 3 mo, sooner prn.

## 2018-01-20 NOTE — Progress Notes (Signed)
Madison Peters is a 67 y.o. female who presents to the office today with the following:  Chief Complaint   Patient presents with   ??? Diabetes     fasting for labs and refills       HPI  Pt notes UTI cleared up finally. Doing well.    Is fasting for BW and needs refills.  Doing well on current regimen.    HTN  BP stable, well-controlled.  Amlodipine 10 mg, Metoprolol xl 50 mg, and HCTZ 25 mg.  No cardiac complaints.    Hyperlipidemia  Doing well on atorvastatin 80 mg.   No myalgias.  Lab Results   Component Value Date/Time    Cholesterol, total 112 05/08/2017 11:53 AM    HDL Cholesterol 32 (L) 05/08/2017 11:53 AM    LDL, calculated 29 05/08/2017 11:53 AM    VLDL, calculated 51 (H) 05/08/2017 11:53 AM    Triglyceride 256 (H) 05/08/2017 11:53 AM   .  Lab Results   Component Value Date/Time    ALT (SGPT) 31 12/19/2016 01:21 PM    AST (SGOT) 15 12/19/2016 01:21 PM    Alk. phosphatase 69 12/19/2016 01:21 PM    Bilirubin, total 0.4 12/19/2016 01:21 PM     T2DM  Lab Results   Component Value Date/Time    Hemoglobin A1c 8.1 (H) 05/08/2017 11:53 AM    Hemoglobin A1c (POC) 8.9 12/10/2016 12:09 PM   on Metformin 1500AM `1000PM  Non compliant with f/u recommendations from visit in Fall for this.  Otherwise doing well. No foot or eye complaints/sxs.    Doing well on current dose of levothyroxine 50 mcg.  Lab Results   Component Value Date/Time    TSH 1.710 12/19/2016 01:21 PM     Emphysema.  Still smokes.  Aware she needs to quit, unmotivated to do so.  Doing okay with inhalers.  No pulmonary complaints today.    Also doing well on her Prozac for anxiety.  Denies any anxiety/depression or other psych sxs.  Pt notes she has been able to cut back to once daily, but does not want to change Rx "just in case" she needs to go back to BID. Will notify us if any change.    Otherwise feeling well with no other complaints or acute concerns.  Has not scheduled mammogram yet.  Pt reports she had a normal colonoscopy 6 yrs ago that was nl.   Does not recall address/location to get MRs.   Denies any sxs.    Review of Systems   Constitutional: Negative.    HENT: Negative.    Respiratory: Negative for hemoptysis, sputum production, shortness of breath and wheezing.    Cardiovascular: Negative.  Negative for chest pain.   Gastrointestinal: Negative.    Genitourinary: Negative.    Musculoskeletal: Negative for myalgias.   Skin: Negative.    Neurological: Negative.    Psychiatric/Behavioral: Negative for depression, hallucinations, memory loss, substance abuse and suicidal ideas. The patient is not nervous/anxious and does not have insomnia.      See HPI.    Past Medical History:   Diagnosis Date   ??? Anxiety    ??? Diabetes ([place])    ??? Dyspepsia    ??? Emphysema    ??? Headache(784.0)    ??? Hypertension    ??? IGT (impaired glucose tolerance)    ??? Kidney stones    ??? Lipoma     Left calf   ??? Thyroid disease     hypothryoidism  Past Surgical History:   Procedure Laterality Date   ??? HX HEENT      Tonsillectomy at age 46       Allergies   Allergen Reactions   ??? Ace Inhibitors Cough   ??? Amoxicillin Nausea and Vomiting   ??? Erythromycin Other (comments)     Does not remember.   ??? Sulfa (Sulfonamide Antibiotics) Nausea and Vomiting       Current Outpatient Medications   Medication Sig   ??? amLODIPine (NORVASC) 10 mg tablet Take 1 Tab by mouth daily.   ??? atorvastatin (LIPITOR) 80 mg tablet TAKE 1 TABLET BY MOUTH EVERY DAY   ??? FLUoxetine (PROZAC) 20 mg capsule TAKE 1 CAPSULE BY MOUTH TWICE A DAY   ??? hydroCHLOROthiazide (HYDRODIURIL) 25 mg tablet TAKE 1 TABLET BY MOUTH EVERY DAY   ??? levothyroxine (SYNTHROID) 50 mcg tablet TAKE 1 TABLET BY MOUTH EVERY DAY   ??? metFORMIN (GLUCOPHAGE) 1,000 mg tablet TAKE 1&1/2 TABLETS EVERY MORNING AND 1 IN THE EVENING WITH A MEAL   ??? metoprolol succinate (TOPROL-XL) 50 mg XL tablet TAKE 1 TABLET BY MOUTH EVERY DAY   ??? doxycycline (ADOXA) 100 mg tablet Take 1 Tab by mouth two (2) times a day for 10 days.    ??? albuterol (PROVENTIL HFA, VENTOLIN HFA, PROAIR HFA) 90 mcg/actuation inhaler Take 1 Puff by inhalation every six (6) hours as needed for Wheezing. Indications: bronchospasm prevention   ??? cranberry extract 450 mg tab tablet Take 450 mg by mouth.   ??? AZO CRANBERRY 450-30-50 mg-mg-million tab Take  by mouth.   ??? aspirin (ASPIRIN) 325 mg tablet Take 325 mg by mouth daily.   ??? ascorbic acid, vitamin C, (VITAMIN C) 500 mg tablet Take  by mouth.   ??? vitamin E (AQUA GEMS) 400 unit capsule Take 1,000 Units by mouth daily.   ??? acetaminophen (TYLENOL) 500 mg tablet Take  by mouth every six (6) hours as needed for Pain.   ??? SODIUM CHLORIDE (SALINE NASAL NA) by Nasal route as needed. 2 sprays each nostril every 12 hours.    ??? multivitamin with iron (DAILY MULTI-VITAMINS/IRON) tablet Take 1 Tab by mouth daily.   ??? Cholecalciferol, Vitamin D3, (VITAMIN D3) 1,000 unit cap Take 5,000 Units by mouth daily.   ??? cyanocobalamin 1,000 mcg tablet Take 2,500 mcg by mouth every Monday and Thursday.   ??? omega-3 fatty acids-vitamin e (FISH OIL) 1,000 mg cap Take 1 Cap by mouth four (4) times daily.     No current facility-administered medications for this visit.        Social History     Socioeconomic History   ??? Marital status: MARRIED     Spouse name: Not on file   ??? Number of children: Not on file   ??? Years of education: Not on file   ??? Highest education level: Not on file   Tobacco Use   ??? Smoking status: Current Every Day Smoker     Packs/day: 1.00   ??? Smokeless tobacco: Never Used   Substance and Sexual Activity   ??? Alcohol use: No   ??? Drug use: No   ??? Sexual activity: Yes     Partners: Male       Family History   Problem Relation Age of Onset   ??? Heart Disease Mother    ??? Other Father         AAA         Physical Exam:  Visit Vitals  BP 120/62 (BP 1 Location: Left arm, BP Patient Position: Sitting)   Pulse 88   Temp 98.4 ??F (36.9 ??C) (Oral)   Ht 5' 6"  (1.676 m)   Wt 169 lb 3.2 oz (76.7 kg)   SpO2 96%   BMI 27.31 kg/m??      Physical Exam   Constitutional: She is oriented to person, place, and time and well-developed, well-nourished, and in no distress.   HENT:   Head: Normocephalic and atraumatic.   Right Ear: External ear normal.   Left Ear: External ear normal.   Nose: Nose normal.   Mouth/Throat: Oropharynx is clear and moist.   Eyes: Conjunctivae and EOM are normal.   Neck: Normal range of motion. Neck supple.   Cardiovascular: Normal rate, regular rhythm and normal heart sounds.   Pulmonary/Chest: Effort normal and breath sounds normal.   Abdominal: Soft. There is no tenderness.   Musculoskeletal: She exhibits no edema.   Lymphadenopathy:     She has no cervical adenopathy.   Neurological: She is alert and oriented to person, place, and time. Gait normal.   Skin: Skin is warm and dry.   Psychiatric: Mood and affect normal.   Nursing note and vitals reviewed.      Assessment/Plan:    ICD-10-CM ICD-9-CM    1. Essential hypertension I10 401.9 CBC WITH AUTOMATED DIFF      METABOLIC PANEL, BASIC      amLODIPine (NORVASC) 10 mg tablet      hydroCHLOROthiazide (HYDRODIURIL) 25 mg tablet      metoprolol succinate (TOPROL-XL) 50 mg XL tablet   2. Thyroid disease E07.9 246.9 levothyroxine (SYNTHROID) 50 mcg tablet   3. Type 2 diabetes mellitus without complication, without long-term current use of insulin (HCC) E11.9 250.00 HEMOGLOBIN A1C WITH EAG      MICROALBUMIN, UR, RAND W/ MICROALB/CREAT RATIO      metFORMIN (GLUCOPHAGE) 1,000 mg tablet   4. Pulmonary emphysema, unspecified emphysema type ([place]) J43.9 492.8    5. Hyperlipidemia, unspecified hyperlipidemia type E78.5 272.4 LIPID PANEL      ALT      AST      atorvastatin (LIPITOR) 80 mg tablet   6. Tobacco abuse Z72.0 305.1    7. Anxiety F41.9 300.00 FLUoxetine (PROZAC) 20 mg capsule       Follow-up and Dispositions    ?? Return in about 6 months (around 07/23/2018), or sooner prn.     stable on current regimen.  Previously a1c a little high. Counseled on LMs. Updated labs pending.   I continue to encourage pt to quit smoking.  Seek care in interim for any new sxs or other concerns.  Pt verbalizes understanding and agrees with the plan.    Juanna Cao, PA-C

## 2018-01-21 LAB — MICROALBUMIN, UR, RAND W/ MICROALB/CREAT RATIO
Creatinine, urine random: 185.9 mg/dL
Microalb/Creat ratio (ug/mg creat.): 39.1 mg/g creat — ABNORMAL HIGH (ref 0.0–30.0)
Microalbumin, urine: 72.7 ug/mL

## 2018-01-21 LAB — CBC WITH AUTOMATED DIFF
ABS. BASOPHILS: 0.1 10*3/uL (ref 0.0–0.2)
ABS. EOSINOPHILS: 0.1 10*3/uL (ref 0.0–0.4)
ABS. IMM. GRANS.: 0 10*3/uL (ref 0.0–0.1)
ABS. MONOCYTES: 0.6 10*3/uL (ref 0.1–0.9)
ABS. NEUTROPHILS: 4.6 10*3/uL (ref 1.4–7.0)
Abs Lymphocytes: 1.8 10*3/uL (ref 0.7–3.1)
BASOPHILS: 2 %
EOSINOPHILS: 2 %
HCT: 43.3 % (ref 34.0–46.6)
HGB: 14.8 g/dL (ref 11.1–15.9)
IMMATURE GRANULOCYTES: 0 %
Lymphocytes: 24 %
MCH: 31.3 pg (ref 26.6–33.0)
MCHC: 34.2 g/dL (ref 31.5–35.7)
MCV: 92 fL (ref 79–97)
MONOCYTES: 9 %
NEUTROPHILS: 63 %
PLATELET: 282 10*3/uL (ref 150–450)
RBC: 4.73 x10E6/uL (ref 3.77–5.28)
RDW: 13.6 % (ref 12.3–15.4)
WBC: 7.3 10*3/uL (ref 3.4–10.8)

## 2018-01-21 LAB — CVD REPORT

## 2018-01-21 LAB — LIPID PANEL
Cholesterol, total: 102 mg/dL (ref 100–199)
HDL Cholesterol: 28 mg/dL — ABNORMAL LOW (ref >39)
LDL, calculated: 24 mg/dL (ref 0–99)
Triglyceride: 250 mg/dL — ABNORMAL HIGH (ref 0–149)
VLDL, calculated: 50 mg/dL — ABNORMAL HIGH (ref 5–40)

## 2018-01-21 LAB — HEMOGLOBIN A1C WITH EAG
Estimated average glucose: 226 mg/dL
Hemoglobin A1c: 9.5 % — ABNORMAL HIGH (ref 4.8–5.6)

## 2018-01-21 LAB — METABOLIC PANEL, BASIC
BUN/Creatinine ratio: 17 (ref 12–28)
BUN: 11 mg/dL (ref 8–27)
CO2: 23 mmol/L (ref 20–29)
Calcium: 9.5 mg/dL (ref 8.7–10.3)
Chloride: 98 mmol/L (ref 96–106)
Creatinine: 0.63 mg/dL (ref 0.57–1.00)
GFR est AA: 109 mL/min/{1.73_m2} (ref >59)
GFR est non-AA: 94 mL/min/{1.73_m2} (ref >59)
Glucose: 264 mg/dL — ABNORMAL HIGH (ref 65–99)
Potassium: 4 mmol/L (ref 3.5–5.2)
Sodium: 140 mmol/L (ref 134–144)

## 2018-01-21 LAB — AST: AST (SGOT): 15 IU/L (ref 0–40)

## 2018-01-21 LAB — ALT: ALT (SGPT): 20 IU/L (ref 0–32)

## 2018-01-30 ENCOUNTER — Telehealth

## 2018-01-30 MED ORDER — CIPROFLOXACIN 500 MG TAB
500 mg | ORAL_TABLET | Freq: Two times a day (BID) | ORAL | 0 refills | Status: DC
Start: 2018-01-30 — End: 2018-02-03

## 2018-01-30 NOTE — Telephone Encounter (Signed)
Patient returns call to office.  I have given her the message from Sjenkins, PA-C that Cipro has been called into her pharmacy.  Patient will stop taking her MV and other vitamins until she is finished the ABX.  She will call and make an apt if sxs get worse or do not go away.

## 2018-01-30 NOTE — Telephone Encounter (Signed)
Please call pt. I am sending another abx Cipro, pt to not take with her MV. However, when I saw her last week she stated her UTI sxs had resolved. She will need to return for further evaluation of her sxs if this is not helping.

## 2018-02-03 ENCOUNTER — Ambulatory Visit: Attending: Geriatric Medicine | Primary: Physician Assistant

## 2018-02-03 ENCOUNTER — Ambulatory Visit: Admit: 2018-02-03 | Discharge: 2018-02-03 | Attending: Geriatric Medicine | Primary: Physician Assistant

## 2018-02-03 DIAGNOSIS — R300 Dysuria: Secondary | ICD-10-CM

## 2018-02-03 LAB — AMB POC URINALYSIS DIP STICK MANUAL W/O MICRO
Glucose (UA POC): NEGATIVE
Glucose, Urine, POC: NEGATIVE
Leukocyte Esterase, Urine, POC: NEGATIVE
Leukocyte esterase (UA POC): NEGATIVE
Nitrite, Urine, POC: NEGATIVE
Nitrites (UA POC): NEGATIVE
Specific Gravity, Urine, POC: 1.03 NA (ref 1.001–1.035)
Specific gravity (UA POC): 1.03 (ref 1.001–1.035)
Urobilinogen (UA POC): 0.2 (ref 0.2–1)
Urobilinogen, POC: 0.2 (ref 0.2–1)
pH (UA POC): 6 (ref 4.6–8.0)
pH, Urine, POC: 6 NA (ref 4.6–8.0)

## 2018-02-03 MED ORDER — FLUCONAZOLE 150 MG TAB
150 mg | ORAL_TABLET | Freq: Every day | ORAL | 0 refills | Status: AC
Start: 2018-02-03 — End: 2018-02-04

## 2018-02-03 NOTE — Progress Notes (Signed)
Chief Complaint   Patient presents with   ??? Urinary Burning     has been on Cipro, burning with and after urination, did not take Cipro today     Visit Vitals  BP 137/66 (BP 1 Location: Left arm, BP Patient Position: Sitting)   Pulse 85   Temp 97.4 ??F (36.3 ??C) (Oral)   Resp 16   Ht 5\' 6"  (1.676 m)   Wt 165 lb (74.8 kg)   SpO2 95%   BMI 26.63 kg/m??     1. Have you been to the ER, urgent care clinic since your last visit?  Hospitalized since your last visit?No    2. Have you seen or consulted any other health care providers outside of the [facility] since your last visit?  Include any pap smears or colon screening. No      02/03/2018      Chief Complaint   Patient presents with   ??? Urinary Burning     has been on Cipro, burning with and after urination, did not take Cipro today         History of Present Illness:        Ms.Dohn is a 67 y.o. female treated 5/10 for culture + UTI w/ doxy, persistent sxs treated with cipro over phone on 5/30. U/A in between was negative. Presents with recurrent terminal dysuria and nausea from cipro. Stopped med 6/2. No fever, flank pain, stools are getting loose.      Allergies   Allergen Reactions   ??? Ace Inhibitors Cough   ??? Amoxicillin Nausea and Vomiting   ??? Erythromycin Other (comments)     Does not remember.   ??? Sulfa (Sulfonamide Antibiotics) Nausea and Vomiting       Current Outpatient Medications   Medication Sig   ??? fluconazole (DIFLUCAN) 150 mg tablet Take 1 Tab by mouth daily for 1 day. FDA advises cautious prescribing of oral fluconazole in pregnancy.   ??? amLODIPine (NORVASC) 10 mg tablet Take 1 Tab by mouth daily.   ??? atorvastatin (LIPITOR) 80 mg tablet TAKE 1 TABLET BY MOUTH EVERY DAY   ??? FLUoxetine (PROZAC) 20 mg capsule TAKE 1 CAPSULE BY MOUTH TWICE A DAY   ??? hydroCHLOROthiazide (HYDRODIURIL) 25 mg tablet TAKE 1 TABLET BY MOUTH EVERY DAY   ??? levothyroxine (SYNTHROID) 50 mcg tablet TAKE 1 TABLET BY MOUTH EVERY DAY   ??? metFORMIN (GLUCOPHAGE) 1,000 mg tablet  TAKE 1&1/2 TABLETS EVERY MORNING AND 1 IN THE EVENING WITH A MEAL   ??? metoprolol succinate (TOPROL-XL) 50 mg XL tablet TAKE 1 TABLET BY MOUTH EVERY DAY   ??? albuterol (PROVENTIL HFA, VENTOLIN HFA, PROAIR HFA) 90 mcg/actuation inhaler Take 1 Puff by inhalation every six (6) hours as needed for Wheezing. Indications: bronchospasm prevention   ??? cranberry extract 450 mg tab tablet Take 450 mg by mouth.   ??? AZO CRANBERRY 450-30-50 mg-mg-million tab Take  by mouth.   ??? aspirin (ASPIRIN) 325 mg tablet Take 325 mg by mouth daily.   ??? ascorbic acid, vitamin C, (VITAMIN C) 500 mg tablet Take  by mouth.   ??? vitamin E (AQUA GEMS) 400 unit capsule Take 1,000 Units by mouth daily.   ??? acetaminophen (TYLENOL) 500 mg tablet Take  by mouth every six (6) hours as needed for Pain.   ??? SODIUM CHLORIDE (SALINE NASAL NA) by Nasal route as needed. 2 sprays each nostril every 12 hours.    ??? multivitamin with iron (DAILY  MULTI-VITAMINS/IRON) tablet Take 1 Tab by mouth daily.   ??? Cholecalciferol, Vitamin D3, (VITAMIN D3) 1,000 unit cap Take 5,000 Units by mouth daily.   ??? cyanocobalamin 1,000 mcg tablet Take 2,500 mcg by mouth every Monday and Thursday.   ??? omega-3 fatty acids-vitamin e (FISH OIL) 1,000 mg cap Take 1 Cap by mouth four (4) times daily.     No current facility-administered medications for this visit.            Physical Examination:    Visit Vitals  BP 137/66 (BP 1 Location: Left arm, BP Patient Position: Sitting)   Pulse 85   Temp 97.4 ??F (36.3 ??C) (Oral)   Resp 16   Ht 5\' 6"  (1.676 m)   Wt 165 lb (74.8 kg)   SpO2 95%   BMI 26.63 kg/m??      General:  Alert, cooperative, no distress.   HEENT:  Normocephalic, without obvious abnormality, atraumatic.Conjunctivae/corneas clear. Pupils equal, round, reactive to light. Extraocular movements intact.TMs and external canals normal bilaterally. Nasal mucosa and oropharynx clear.   Lungs: Clear to auscultation bilaterally.   Chest wall:  No tenderness or deformity.   Heart:  Regular  rate and rhythm, S1, S2 normal, no murmur, click, rub, or gallop.   Abdomen:   Soft, non-tender. Bowel sounds normal. No masses. No organomegaly.   Extremities: Extremities normal, atraumatic, no cyanosis or edema.   Pulses: 2+ and symmetric all extremities.   Skin: Skin color, texture, turgor normal. No rashes or lesions.   Lymph nodes: Cervical, supraclavicular, and axillary nodes normal.   Neurologic: CNII-XII intact. Normal strength, sensation, and reflexes throughout.         ASSESSMENT AND PLAN    1. Dysuria  No current evidence of UTI. No indication to continue abx or culture. Urine is concentrated, needs to increase fluid intake.  - fluconazole (DIFLUCAN) 150 mg tablet; Take 1 Tab by mouth daily for 1 day. FDA advises cautious prescribing of oral fluconazole in pregnancy.  Dispense: 1 Tab; Refill: 0            Orders Placed This Encounter   ??? fluconazole (DIFLUCAN) 150 mg tablet     Sig: Take 1 Tab by mouth daily for 1 day. FDA advises cautious prescribing of oral fluconazole in pregnancy.     Dispense:  1 Tab     Refill:  0           Ramey Berlin, MD

## 2018-02-03 NOTE — Progress Notes (Signed)
Chief Complaint   Patient presents with   ??? Urinary Burning     has been on Cipro, burning with and after urination, did not take Cipro today     Visit Vitals  BP 137/66 (BP 1 Location: Left arm, BP Patient Position: Sitting)   Pulse 85   Temp 97.4 ??F (36.3 ??C) (Oral)   Resp 16   Ht 5\' 6"  (1.676 m)   Wt 165 lb (74.8 kg)   SpO2 95%   BMI 26.63 kg/m??     1. Have you been to the ER, urgent care clinic since your last visit?  Hospitalized since your last visit?No    2. Have you seen or consulted any other health care providers outside of the [facility] since your last visit?  Include any pap smears or colon screening. No      02/03/2018      Chief Complaint   Patient presents with   ??? Urinary Burning     has been on Cipro, burning with and after urination, did not take Cipro today         History of Present Illness:        Madison Peters is a 67 y.o. female treated 5/10 for culture + UTI w/ doxy, persistent sxs treated with cipro over phone on 5/30. U/A in between was negative. Presents with recurrent terminal dysuria and nausea from cipro. Stopped med 6/2. No fever, flank pain, stools are getting loose.      Allergies   Allergen Reactions   ??? Ace Inhibitors Cough   ??? Amoxicillin Nausea and Vomiting   ??? Erythromycin Other (comments)     Does not remember.   ??? Sulfa (Sulfonamide Antibiotics) Nausea and Vomiting       Current Outpatient Medications   Medication Sig   ??? fluconazole (DIFLUCAN) 150 mg tablet Take 1 Tab by mouth daily for 1 day. FDA advises cautious prescribing of oral fluconazole in pregnancy.   ??? amLODIPine (NORVASC) 10 mg tablet Take 1 Tab by mouth daily.   ??? atorvastatin (LIPITOR) 80 mg tablet TAKE 1 TABLET BY MOUTH EVERY DAY   ??? FLUoxetine (PROZAC) 20 mg capsule TAKE 1 CAPSULE BY MOUTH TWICE A DAY   ??? hydroCHLOROthiazide (HYDRODIURIL) 25 mg tablet TAKE 1 TABLET BY MOUTH EVERY DAY   ??? levothyroxine (SYNTHROID) 50 mcg tablet TAKE 1 TABLET BY MOUTH EVERY DAY    ??? metFORMIN (GLUCOPHAGE) 1,000 mg tablet TAKE 1&1/2 TABLETS EVERY MORNING AND 1 IN THE EVENING WITH A MEAL   ??? metoprolol succinate (TOPROL-XL) 50 mg XL tablet TAKE 1 TABLET BY MOUTH EVERY DAY   ??? albuterol (PROVENTIL HFA, VENTOLIN HFA, PROAIR HFA) 90 mcg/actuation inhaler Take 1 Puff by inhalation every six (6) hours as needed for Wheezing. Indications: bronchospasm prevention   ??? cranberry extract 450 mg tab tablet Take 450 mg by mouth.   ??? AZO CRANBERRY 450-30-50 mg-mg-million tab Take  by mouth.   ??? aspirin (ASPIRIN) 325 mg tablet Take 325 mg by mouth daily.   ??? ascorbic acid, vitamin C, (VITAMIN C) 500 mg tablet Take  by mouth.   ??? vitamin E (AQUA GEMS) 400 unit capsule Take 1,000 Units by mouth daily.   ??? acetaminophen (TYLENOL) 500 mg tablet Take  by mouth every six (6) hours as needed for Pain.   ??? SODIUM CHLORIDE (SALINE NASAL NA) by Nasal route as needed. 2 sprays each nostril every 12 hours.    ??? multivitamin with iron (DAILY  MULTI-VITAMINS/IRON) tablet Take 1 Tab by mouth daily.   ??? Cholecalciferol, Vitamin D3, (VITAMIN D3) 1,000 unit cap Take 5,000 Units by mouth daily.   ??? cyanocobalamin 1,000 mcg tablet Take 2,500 mcg by mouth every Monday and Thursday.   ??? omega-3 fatty acids-vitamin e (FISH OIL) 1,000 mg cap Take 1 Cap by mouth four (4) times daily.     No current facility-administered medications for this visit.            Physical Examination:    Visit Vitals  BP 137/66 (BP 1 Location: Left arm, BP Patient Position: Sitting)   Pulse 85   Temp 97.4 ??F (36.3 ??C) (Oral)   Resp 16   Ht 5\' 6"  (1.676 m)   Wt 165 lb (74.8 kg)   SpO2 95%   BMI 26.63 kg/m??      General:  Alert, cooperative, no distress.   HEENT:  Normocephalic, without obvious abnormality, atraumatic.Conjunctivae/corneas clear. Pupils equal, round, reactive to light. Extraocular movements intact.TMs and external canals normal bilaterally. Nasal mucosa and oropharynx clear.   Lungs: Clear to auscultation bilaterally.    Chest wall:  No tenderness or deformity.   Heart:  Regular rate and rhythm, S1, S2 normal, no murmur, click, rub, or gallop.   Abdomen:   Soft, non-tender. Bowel sounds normal. No masses. No organomegaly.   Extremities: Extremities normal, atraumatic, no cyanosis or edema.   Pulses: 2+ and symmetric all extremities.   Skin: Skin color, texture, turgor normal. No rashes or lesions.   Lymph nodes: Cervical, supraclavicular, and axillary nodes normal.   Neurologic: CNII-XII intact. Normal strength, sensation, and reflexes throughout.         ASSESSMENT AND PLAN    1. Dysuria  No current evidence of UTI. No indication to continue abx or culture. Urine is concentrated, needs to increase fluid intake.  - fluconazole (DIFLUCAN) 150 mg tablet; Take 1 Tab by mouth daily for 1 day. FDA advises cautious prescribing of oral fluconazole in pregnancy.  Dispense: 1 Tab; Refill: 0            Orders Placed This Encounter   ??? fluconazole (DIFLUCAN) 150 mg tablet     Sig: Take 1 Tab by mouth daily for 1 day. FDA advises cautious prescribing of oral fluconazole in pregnancy.     Dispense:  1 Tab     Refill:  0           Madison BerlinSteven M Madison Nudo, MD

## 2018-02-11 ENCOUNTER — Ambulatory Visit: Admit: 2018-02-11 | Discharge: 2018-02-11 | Attending: Physician Assistant | Primary: Physician Assistant

## 2018-02-11 DIAGNOSIS — I10 Essential (primary) hypertension: Secondary | ICD-10-CM

## 2018-02-11 MED ORDER — METOPROLOL SUCCINATE SR 25 MG 24 HR TAB
25 mg | ORAL_TABLET | Freq: Every day | ORAL | 0 refills | Status: DC
Start: 2018-02-11 — End: 2018-04-08

## 2018-02-11 NOTE — Progress Notes (Signed)
 Visit Vitals  BP 148/90 (BP 1 Location: Left arm, BP Patient Position: At rest)   Pulse 98   Temp 97.8 F (36.6 C) (Oral)   Resp 16   Ht 5' 6 (1.676 m)   Wt 160 lb (72.6 kg)   SpO2 97%   BMI 25.82 kg/m     Chief Complaint   Patient presents with   . Hospital Follow Up     1. Have you been to the ER, urgent care clinic since your last visit?  Hospitalized since your last visit?Yes Where: [facility]    2. Have you seen or consulted any other health care providers outside of the [facility] since your last visit?  Include any pap smears or colon screening. Yes Where: [facility] [employer]

## 2018-02-11 NOTE — Progress Notes (Signed)
Pt notified while in office.

## 2018-02-11 NOTE — Progress Notes (Signed)
Madison FannyJanet L Peters is a 67 y.o. female who presents to the office today with the following:  Chief Complaint   Patient presents with   ??? Hospital Follow Up       HPI   Here for hospital f/u after tx for BV and electrolyte abnls.  Completed one week course of Flagyl and to f/u with Dr. Aurelio BrashLungsford with OBGYN for an incidental finding of rectocele.   Was taken off all of her BP meds and told to resume only Metoprolol at 25 g half the dose and titrate up if BP rose to 120/80 or greater, maybe later to add amlodipine if maxed out and BP still high, but to remain off HCTZ due to propensity to cause hyponatremia and other electrolytes abnls. She was also instructed to monitor BP and keep log.  At discharge pt was stable, Na back in 130s, but potassium and mag still repleted, told to drink no more than 8 glasses water daily.    In addn, A1c found to be 8.7 (down from 9.5 last month) has lost weight with being sick so much and also is trying hard to eat healthy.  G/C neg. No yeast. Few clue cells.   WBC, Bilirubin, TSH wnl. etoh neg.  Did still have pos nitrites in UA w/o other abnls on 6/4.    Pt also notes she has quit smoking since.  Has had no cigarette in 8 days, using patch.    Pt notes home BP has been  Has not restarted any of her medication yet, wanted to be seen first.  Her BP has been ranging from 120s-160/70-80s.    Pt notes all other sxs resolved, but still has a constant burning at vaginal opening, not only with urination.   She notes this has never resolved since hospitalization. No other GU sxs.   Using Monistat currently, also had diflucan prior to going to ER.  Has had some diarrhea past couple days suspects from Flagyl. Took last pill today.    Review of Systems   Constitutional: Negative for chills and fever.   Respiratory: Negative.    Cardiovascular: Negative.    Gastrointestinal: Negative.    Genitourinary: Positive for dysuria. Negative for flank pain, frequency, hematuria and urgency.   Musculoskeletal:  Negative for myalgias.   Skin: Negative for rash.     See HPI.    Past Medical History:   Diagnosis Date   ??? Anxiety    ??? Diabetes (HCC)    ??? Dyspepsia    ??? Emphysema    ??? Headache(784.0)    ??? Hypertension    ??? IGT (impaired glucose tolerance)    ??? Kidney stones    ??? Lipoma     Left calf   ??? Thyroid disease     hypothryoidism       Past Surgical History:   Procedure Laterality Date   ??? HX HEENT      Tonsillectomy at age 67       Allergies   Allergen Reactions   ??? Ace Inhibitors Cough   ??? Amoxicillin Nausea and Vomiting   ??? Erythromycin Other (comments)     Does not remember.   ??? Sulfa (Sulfonamide Antibiotics) Nausea and Vomiting       Current Outpatient Medications   Medication Sig   ??? levoFLOXacin (LEVAQUIN) 750 mg tablet Take 1 Tab by mouth daily for 5 days.   ??? metoprolol succinate (TOPROL-XL) 25 mg XL tablet Take 1 Tab by mouth daily.   ???  atorvastatin (LIPITOR) 80 mg tablet TAKE 1 TABLET BY MOUTH EVERY DAY   ??? FLUoxetine (PROZAC) 20 mg capsule TAKE 1 CAPSULE BY MOUTH TWICE A DAY   ??? levothyroxine (SYNTHROID) 50 mcg tablet TAKE 1 TABLET BY MOUTH EVERY DAY   ??? metFORMIN (GLUCOPHAGE) 1,000 mg tablet TAKE 1&1/2 TABLETS EVERY MORNING AND 1 IN THE EVENING WITH A MEAL   ??? albuterol (PROVENTIL HFA, VENTOLIN HFA, PROAIR HFA) 90 mcg/actuation inhaler Take 1 Puff by inhalation every six (6) hours as needed for Wheezing. Indications: bronchospasm prevention   ??? cranberry extract 450 mg tab tablet Take 450 mg by mouth.   ??? AZO CRANBERRY 450-30-50 mg-mg-million tab Take  by mouth.   ??? aspirin (ASPIRIN) 325 mg tablet Take 325 mg by mouth daily.   ??? ascorbic acid, vitamin C, (VITAMIN C) 500 mg tablet Take  by mouth.   ??? vitamin E (AQUA GEMS) 400 unit capsule Take 1,000 Units by mouth daily.   ??? acetaminophen (TYLENOL) 500 mg tablet Take  by mouth every six (6) hours as needed for Pain.   ??? SODIUM CHLORIDE (SALINE NASAL NA) by Nasal route as needed. 2 sprays each nostril every 12 hours.    ??? multivitamin with iron (DAILY  MULTI-VITAMINS/IRON) tablet Take 1 Tab by mouth daily.   ??? Cholecalciferol, Vitamin D3, (VITAMIN D3) 1,000 unit cap Take 5,000 Units by mouth daily.   ??? cyanocobalamin 1,000 mcg tablet Take 2,500 mcg by mouth every Monday and Thursday.   ??? omega-3 fatty acids-vitamin e (FISH OIL) 1,000 mg cap Take 1 Cap by mouth four (4) times daily.     No current facility-administered medications for this visit.        Social History     Socioeconomic History   ??? Marital status: MARRIED     Spouse name: Not on file   ??? Number of children: Not on file   ??? Years of education: Not on file   ??? Highest education level: Not on file   Tobacco Use   ??? Smoking status: Current Every Day Smoker     Packs/day: 1.00   ??? Smokeless tobacco: Never Used   Substance and Sexual Activity   ??? Alcohol use: No   ??? Drug use: No   ??? Sexual activity: Yes     Partners: Male       Family History   Problem Relation Age of Onset   ??? Heart Disease Mother    ??? Other Father         AAA         Physical Exam:  Visit Vitals  BP 148/90 (BP 1 Location: Left arm, BP Patient Position: At rest)   Pulse 98   Temp 97.8 ??F (36.6 ??C) (Oral)   Resp 16   Ht 5\' 6"  (1.676 m)   Wt 160 lb (72.6 kg)   SpO2 97%   BMI 25.82 kg/m??     Physical Exam   Constitutional: She is oriented to person, place, and time and well-developed, well-nourished, and in no distress.   HENT:   Head: Normocephalic and atraumatic.   Eyes: Conjunctivae are normal.   Neck: Neck supple.   Cardiovascular: Normal rate and regular rhythm.   Pulmonary/Chest: Effort normal and breath sounds normal.   Abdominal: Soft. She exhibits no distension. There is no tenderness. There is no guarding.   Genitourinary:   Genitourinary Comments: Rectocele noted at vaginal opening with pt reported tenderness to that area and somewhat along  vulva. Very faint erythema but no obvious rash/yeast/discharge or other abls. External exam done only, not speculum.   Neurological: She is alert and oriented to person, place, and time. Gait  normal.   Skin: Skin is warm and dry.   Psychiatric: Mood and affect normal.   Nursing note and vitals reviewed.      Assessment/Plan:    ICD-10-CM ICD-9-CM    1. Essential hypertension I10 401.9 metoprolol succinate (TOPROL-XL) 25 mg XL tablet   2. Abnormal blood electrolyte level E87.8 276.9 METABOLIC PANEL, BASIC   3. Vaginal burning N94.9 625.8 AMB POC URINALYSIS DIP STICK MANUAL W/O MICRO      REFERRAL TO GYNECOLOGY   4. Rectocele N81.6 618.04 REFERRAL TO GYNECOLOGY   5. Hospital discharge follow-up Z09 V67.59    6. 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  Pt to continue to work on LMs and monitor BP and f/u in 1-2 weeks with log. May need to go back to her usual 50 mg but will give it time to be sure prior to further adjustment. Pt has lost some weight.    She will return tomorrow for clean catch, took AZO today so unable to recurring UTI.     I have tried contacting other GYN offices.  Finally able to reach [facility]- recommend pt try hydrocortisone for irritation while she waits to be seen.  Offered her sooner apt with them but she declined.    Addendum 02/12/18  Pt in for UA. Shows signs of UTI- resending culture and starting tx. Labs reviewed and potassium and sodium wnl.   Pt now says she changed her mind and would like to take apt with [facility]. Referral entered.        Results for orders placed or performed in visit on 02/11/18   METABOLIC PANEL, BASIC   Result Value Ref Range    Glucose 109 (H) 65 - 99 mg/dL    BUN 11 8 - 27 mg/dL    Creatinine 1.19 1.47 - 1.00 mg/dL    GFR est non-AA 85 >82 mL/min/1.73    GFR est AA 98 >59 mL/min/1.73    BUN/Creatinine ratio 15 12 - 28    Sodium 135 134 - 144 mmol/L    Potassium 4.6 3.5 - 5.2 mmol/L    Chloride 94 (L) 96 - 106 mmol/L    CO2 22 20 - 29 mmol/L    Calcium 10.2 8.7 - 10.3 mg/dL     Results for orders placed or performed in visit on 02/12/18   AMB POC  URINALYSIS DIP STICK MANUAL W/O MICRO     Status: Abnormal   Result Value Ref Range Status    Color (UA POC) Yellow  Final    Clarity (UA POC) Turbid  Final    Glucose (UA POC) Negative Negative Final    Bilirubin (UA POC) Negative Negative Final    Ketones (UA POC) 1+ Negative Final    Specific gravity (UA POC) -1.005 (A) 1.001 - 1.035 Final    Blood (UA POC) Negative Negative Final    pH (UA POC) 5.5 4.6 - 8.0 Final    Protein (UA POC) Negative Negative Final    Urobilinogen (UA POC) 0.2 mg/dL 0.2 - 1 Final    Nitrites (UA POC) Positive Negative Final    Leukocyte esterase (UA  POC) Trace Negative Final     Total time spent with the patient today was 25 minutes of which more than 50% was spent face to face with the patient.    Seek care in interim for any new sxs or other concerns.  Pt verbalizes understanding and agrees with the plan.    Marlan PalauSarah S. Jenkins, PA-C

## 2018-02-11 NOTE — Telephone Encounter (Signed)
Consulted with GYN. They recommend hydrocortisone. Also offered sooner apt with [facility] which pt declines. She will f/u with me in interim for any ongoing issues and will return tomorrow to leave clean catch urine specimen.

## 2018-02-11 NOTE — Progress Notes (Signed)
Visit Vitals  BP 148/90 (BP 1 Location: Left arm, BP Patient Position: At rest)   Pulse 98   Temp 97.8 ??F (36.6 ??C) (Oral)   Resp 16   Ht 5' 6" (1.676 m)   Wt 160 lb (72.6 kg)   SpO2 97%   BMI 25.82 kg/m??     Chief Complaint   Patient presents with   ??? Hospital Follow Up     1. Have you been to the ER, urgent care clinic since your last visit?  Hospitalized since your last visit?Yes Where: [facility]    2. Have you seen or consulted any other health care providers outside of the [facility] since your last visit?  Include any pap smears or colon screening. Yes Where: [facility]

## 2018-02-11 NOTE — Progress Notes (Signed)
Madison Peters is a 67 y.o. female who presents to the office today with the following:  Chief Complaint   Patient presents with   ??? Hospital Follow Up       HPI   Here for hospital f/u after tx for BV and electrolyte abnls.  Completed one week course of Flagyl and to f/u with Dr. Aurelio Brash with OBGYN for an incidental finding of rectocele.   Was taken off all of her BP meds and told to resume only Metoprolol at 25 g half the dose and titrate up if BP rose to 120/80 or greater, maybe later to add amlodipine if maxed out and BP still high, but to remain off HCTZ due to propensity to cause hyponatremia and other electrolytes abnls. She was also instructed to monitor BP and keep log.  At discharge pt was stable, Na back in 130s, but potassium and mag still repleted, told to drink no more than 8 glasses water daily.    In addn, A1c found to be 8.7 (down from 9.5 last month) has lost weight with being sick so much and also is trying hard to eat healthy.  G/C neg. No yeast. Few clue cells.   WBC, Bilirubin, TSH wnl. etoh neg.  Did still have pos nitrites in UA w/o other abnls on 6/4.    Pt also notes she has quit smoking since.  Has had no cigarette in 8 days, using patch.    Pt notes home BP has been  Has not restarted any of her medication yet, wanted to be seen first.  Her BP has been ranging from 120s-160/70-80s.    Pt notes all other sxs resolved, but still has a constant burning at vaginal opening, not only with urination.   She notes this has never resolved since hospitalization. No other GU sxs.   Using Monistat currently, also had diflucan prior to going to ER.  Has had some diarrhea past couple days suspects from Flagyl. Took last pill today.    Review of Systems   Constitutional: Negative for chills and fever.   Respiratory: Negative.    Cardiovascular: Negative.    Gastrointestinal: Negative.    Genitourinary: Positive for dysuria. Negative for flank pain, frequency, hematuria and urgency.    Musculoskeletal: Negative for myalgias.   Skin: Negative for rash.     See HPI.    Past Medical History:   Diagnosis Date   ??? Anxiety    ??? Diabetes (HCC)    ??? Dyspepsia    ??? Emphysema    ??? Headache(784.0)    ??? Hypertension    ??? IGT (impaired glucose tolerance)    ??? Kidney stones    ??? Lipoma     Left calf   ??? Thyroid disease     hypothryoidism       Past Surgical History:   Procedure Laterality Date   ??? HX HEENT      Tonsillectomy at age 70       Allergies   Allergen Reactions   ??? Ace Inhibitors Cough   ??? Amoxicillin Nausea and Vomiting   ??? Erythromycin Other (comments)     Does not remember.   ??? Sulfa (Sulfonamide Antibiotics) Nausea and Vomiting       Current Outpatient Medications   Medication Sig   ??? levoFLOXacin (LEVAQUIN) 750 mg tablet Take 1 Tab by mouth daily for 5 days.   ??? metoprolol succinate (TOPROL-XL) 25 mg XL tablet Take 1 Tab by mouth daily.   ???  atorvastatin (LIPITOR) 80 mg tablet TAKE 1 TABLET BY MOUTH EVERY DAY   ??? FLUoxetine (PROZAC) 20 mg capsule TAKE 1 CAPSULE BY MOUTH TWICE A DAY   ??? levothyroxine (SYNTHROID) 50 mcg tablet TAKE 1 TABLET BY MOUTH EVERY DAY   ??? metFORMIN (GLUCOPHAGE) 1,000 mg tablet TAKE 1&1/2 TABLETS EVERY MORNING AND 1 IN THE EVENING WITH A MEAL   ??? albuterol (PROVENTIL HFA, VENTOLIN HFA, PROAIR HFA) 90 mcg/actuation inhaler Take 1 Puff by inhalation every six (6) hours as needed for Wheezing. Indications: bronchospasm prevention   ??? cranberry extract 450 mg tab tablet Take 450 mg by mouth.   ??? AZO CRANBERRY 450-30-50 mg-mg-million tab Take  by mouth.   ??? aspirin (ASPIRIN) 325 mg tablet Take 325 mg by mouth daily.   ??? ascorbic acid, vitamin C, (VITAMIN C) 500 mg tablet Take  by mouth.   ??? vitamin E (AQUA GEMS) 400 unit capsule Take 1,000 Units by mouth daily.   ??? acetaminophen (TYLENOL) 500 mg tablet Take  by mouth every six (6) hours as needed for Pain.   ??? SODIUM CHLORIDE (SALINE NASAL NA) by Nasal route as needed. 2 sprays each nostril every 12 hours.     ??? multivitamin with iron (DAILY MULTI-VITAMINS/IRON) tablet Take 1 Tab by mouth daily.   ??? Cholecalciferol, Vitamin D3, (VITAMIN D3) 1,000 unit cap Take 5,000 Units by mouth daily.   ??? cyanocobalamin 1,000 mcg tablet Take 2,500 mcg by mouth every Monday and Thursday.   ??? omega-3 fatty acids-vitamin e (FISH OIL) 1,000 mg cap Take 1 Cap by mouth four (4) times daily.     No current facility-administered medications for this visit.        Social History     Socioeconomic History   ??? Marital status: MARRIED     Spouse name: Not on file   ??? Number of children: Not on file   ??? Years of education: Not on file   ??? Highest education level: Not on file   Tobacco Use   ??? Smoking status: Current Every Day Smoker     Packs/day: 1.00   ??? Smokeless tobacco: Never Used   Substance and Sexual Activity   ??? Alcohol use: No   ??? Drug use: No   ??? Sexual activity: Yes     Partners: Male       Family History   Problem Relation Age of Onset   ??? Heart Disease Mother    ??? Other Father         AAA         Physical Exam:  Visit Vitals  BP 148/90 (BP 1 Location: Left arm, BP Patient Position: At rest)   Pulse 98   Temp 97.8 ??F (36.6 ??C) (Oral)   Resp 16   Ht 5\' 6"  (1.676 m)   Wt 160 lb (72.6 kg)   SpO2 97%   BMI 25.82 kg/m??     Physical Exam   Constitutional: She is oriented to person, place, and time and well-developed, well-nourished, and in no distress.   HENT:   Head: Normocephalic and atraumatic.   Eyes: Conjunctivae are normal.   Neck: Neck supple.   Cardiovascular: Normal rate and regular rhythm.   Pulmonary/Chest: Effort normal and breath sounds normal.   Abdominal: Soft. She exhibits no distension. There is no tenderness. There is no guarding.   Genitourinary:   Genitourinary Comments: Rectocele noted at vaginal opening with pt reported tenderness to that area and somewhat along  vulva. Very faint erythema but no obvious rash/yeast/discharge or other abls. External exam done only, not speculum.    Neurological: She is alert and oriented to person, place, and time. Gait normal.   Skin: Skin is warm and dry.   Psychiatric: Mood and affect normal.   Nursing note and vitals reviewed.      Assessment/Plan:    ICD-10-CM ICD-9-CM    1. Essential hypertension I10 401.9 metoprolol succinate (TOPROL-XL) 25 mg XL tablet   2. Abnormal blood electrolyte level E87.8 276.9 METABOLIC PANEL, BASIC   3. Vaginal burning N94.9 625.8 AMB POC URINALYSIS DIP STICK MANUAL W/O MICRO      REFERRAL TO GYNECOLOGY   4. Rectocele N81.6 618.04 REFERRAL TO GYNECOLOGY   5. Hospital discharge follow-up Z09 V67.59    6. 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  Pt to continue to work on LMs and monitor BP and f/u in 1-2 weeks with log. May need to go back to her usual 50 mg but will give it time to be sure prior to further adjustment. Pt has lost some weight.    She will return tomorrow for clean catch, took AZO today so unable to recurring UTI.     I have tried contacting other GYN offices.  Finally able to reach [facility]- recommend pt try hydrocortisone for irritation while she waits to be seen.  Offered her sooner apt with them but she declined.    Addendum 02/12/18  Pt in for UA. Shows signs of UTI- resending culture and starting tx. Labs reviewed and potassium and sodium wnl.   Pt now says she changed her mind and would like to take apt with [facility]. Referral entered.        Results for orders placed or performed in visit on 02/11/18   METABOLIC PANEL, BASIC   Result Value Ref Range    Glucose 109 (H) 65 - 99 mg/dL    BUN 11 8 - 27 mg/dL    Creatinine 4.03 4.74 - 1.00 mg/dL    GFR est non-AA 85 >25 mL/min/1.73    GFR est AA 98 >59 mL/min/1.73    BUN/Creatinine ratio 15 12 - 28    Sodium 135 134 - 144 mmol/L    Potassium 4.6 3.5 - 5.2 mmol/L    Chloride 94 (L) 96 - 106 mmol/L    CO2 22 20 - 29 mmol/L    Calcium 10.2 8.7 - 10.3 mg/dL      Results for orders placed or performed in visit on 02/12/18   AMB POC URINALYSIS DIP STICK MANUAL W/O MICRO     Status: Abnormal   Result Value Ref Range Status    Color (UA POC) Yellow  Final    Clarity (UA POC) Turbid  Final    Glucose (UA POC) Negative Negative Final    Bilirubin (UA POC) Negative Negative Final    Ketones (UA POC) 1+ Negative Final    Specific gravity (UA POC) -1.005 (A) 1.001 - 1.035 Final    Blood (UA POC) Negative Negative Final    pH (UA POC) 5.5 4.6 - 8.0 Final    Protein (UA POC) Negative Negative Final    Urobilinogen (UA POC) 0.2 mg/dL 0.2 - 1 Final    Nitrites (UA POC) Positive Negative Final    Leukocyte esterase (UA  POC) Trace Negative Final     Total time spent with the patient today was 25 minutes of which more than 50% was spent face to face with the patient.    Seek care in interim for any new sxs or other concerns.  Pt verbalizes understanding and agrees with the plan.    Marlan PalauSarah S. Jenkins, PA-C

## 2018-02-11 NOTE — Telephone Encounter (Signed)
Patient is awaiting call back regarding her prescription.  She states she is in a lot of pain

## 2018-02-12 ENCOUNTER — Institutional Professional Consult (permissible substitution): Admit: 2018-02-12 | Primary: Physician Assistant

## 2018-02-12 DIAGNOSIS — N390 Urinary tract infection, site not specified: Secondary | ICD-10-CM

## 2018-02-12 LAB — AMB POC URINALYSIS DIP STICK MANUAL W/O MICRO
Bilirubin (UA POC): NEGATIVE
Bilirubin, Urine, POC: NEGATIVE
Blood (UA POC): NEGATIVE
Blood (UA POC): NEGATIVE
Glucose (UA POC): NEGATIVE
Glucose, Urine, POC: NEGATIVE
Nitrite, Urine, POC: POSITIVE
Nitrites (UA POC): POSITIVE
Protein (UA POC): NEGATIVE
Protein, Urine, POC: NEGATIVE
Specific Gravity, Urine, POC: -1.005 NA — AB (ref 1.001–1.035)
Specific gravity (UA POC): -1.005 — AB (ref 1.001–1.035)
Urobilinogen (UA POC): 0.2 (ref 0.2–1)
Urobilinogen, POC: 0.2 (ref 0.2–1)
pH (UA POC): 5.5 (ref 4.6–8.0)
pH, Urine, POC: 5.5 NA (ref 4.6–8.0)

## 2018-02-12 LAB — METABOLIC PANEL, BASIC
BUN/Creatinine ratio: 15 (ref 12–28)
BUN: 11 mg/dL (ref 8–27)
CO2: 22 mmol/L (ref 20–29)
Calcium: 10.2 mg/dL (ref 8.7–10.3)
Chloride: 94 mmol/L — ABNORMAL LOW (ref 96–106)
Creatinine: 0.74 mg/dL (ref 0.57–1.00)
GFR est AA: 98 mL/min/{1.73_m2} (ref >59)
GFR est non-AA: 85 mL/min/{1.73_m2} (ref >59)
Glucose: 109 mg/dL — ABNORMAL HIGH (ref 65–99)
Potassium: 4.6 mmol/L (ref 3.5–5.2)
Sodium: 135 mmol/L (ref 134–144)

## 2018-02-12 LAB — BASIC METABOLIC PANEL
BUN: 11 mg/dL (ref 8–27)
Bun/Cre Ratio: 15 NA (ref 12–28)
CO2: 22 mmol/L (ref 20–29)
Calcium: 10.2 mg/dL (ref 8.7–10.3)
Chloride: 94 mmol/L — ABNORMAL LOW (ref 96–106)
Creatinine: 0.74 mg/dL (ref 0.57–1.00)
EGFR IF NonAfrican American: 85 mL/min/{1.73_m2} (ref >59)
GFR African American: 98 mL/min/{1.73_m2} (ref >59)
Glucose: 109 mg/dL — ABNORMAL HIGH (ref 65–99)
Potassium: 4.6 mmol/L (ref 3.5–5.2)
Sodium: 135 mmol/L (ref 134–144)

## 2018-02-12 MED ORDER — LEVOFLOXACIN 750 MG TAB
750 mg | ORAL_TABLET | Freq: Every day | ORAL | 0 refills | Status: AC
Start: 2018-02-12 — End: 2018-02-17

## 2018-02-12 NOTE — Addendum Note (Signed)
Addendum Note by Jerilee FieldPayne,  Amy at 02/12/18 0830                Author: Suzie PortelaPayne, Amy  Service: --  Author Type: Licensed Nurse       Filed: 02/12/18 1142  Encounter Date: 02/12/2018  Status: Signed          Editor: Suzie PortelaPayne, Amy          Addended by: Jerilee FieldPAYNE, AMY on: 02/12/2018 11:42 AM    Modules accepted: Orders

## 2018-02-12 NOTE — Progress Notes (Signed)
Notify pt her urine culture is unremarkable.

## 2018-02-12 NOTE — Progress Notes (Signed)
Patient notified of recent urine cx.

## 2018-02-12 NOTE — Progress Notes (Signed)
Just for your info appt made with Nita SickleChris Slavin for tomorrow 02/13/2018 at 10am was no appts with Dr Valentina LucksStout until July and she has [employer]BS so she does not need to see a MD first spoke with patient and gave her information and directions

## 2018-02-12 NOTE — Progress Notes (Signed)
Discussed with pt in office. Sending more abx and culture. Pt changed her mind about GYN [facility]. Referral put in.

## 2018-02-12 NOTE — Addendum Note (Signed)
Addended byJerilee Field: Trinidi Toppins on: 02/12/2018 11:42 AM     Modules accepted: Orders

## 2018-02-12 NOTE — Progress Notes (Signed)
Discussed with pt in office. Sending more abx and culture. Pt changed her mind about GYN now wants to go to [facility]. Referral put in.

## 2018-02-12 NOTE — Progress Notes (Signed)
Notify pt her urine culture is unremarkable.

## 2018-02-12 NOTE — Progress Notes (Signed)
Patient notified of recent urine cx.

## 2018-02-12 NOTE — Progress Notes (Signed)
Just for your info appt made with Chris Slavin for tomorrow 02/13/2018 at 10am was no appts with Dr Stout until July and she has [employer]BS so she does not need to see a MD first spoke with patient and gave her information and directions

## 2018-02-14 LAB — CULTURE, URINE

## 2018-02-17 LAB — HM PAP SMEAR
PAP Smear, External: NEGATIVE
Pap Smear, External: NEGATIVE

## 2018-02-19 DIAGNOSIS — R102 Pelvic and perineal pain: Secondary | ICD-10-CM

## 2018-02-19 NOTE — ED Notes (Signed)
Wtr assisted and chaperoned Dr Mikey Bussing during vaginal exam.  Cx and preps obtained.

## 2018-02-19 NOTE — ED Notes (Signed)
Pt sts she has had a vaginal yeast infection x 5wks.  Outpt therapy is not clearing up the condition. Pt c/o internal vaginal burning w/o discharge.  In addition, she was recently in ICU at [facility] x2d for hypoNa+ approx 3wks ago.  Pt is no longer menstruating.

## 2018-02-19 NOTE — ED Provider Notes (Signed)
ED Provider Notes by Vaughan Browner, MD at 02/19/18 2229                Author: Vaughan Browner, MD  Service: Emergency Medicine  Author Type: Physician       Filed: 02/19/18 2327  Date of Service: 02/19/18 2229  Status: Signed          Editor: Vaughan Browner, MD (Physician)               Emergency Department History & Physical Examination                Patient Information     Date of Service: 02/19/2018    Patient Name: Madison Peters    Date of Birth: [birth year]   Medical Record Number: [id number]   Primary Care Provider: Marlan Palau, PA-C    Specialists:                 History of Present Illness     History Provided By:  patient and spouse     Chief Complaint       Patient presents with        ?  Vaginal Pain             burning pain inside vagina w/o discharge x 5wks            History of Present Illness:         Madison Peters is a 67 y.o. female with past medical history of hypertension, hypothyroid, diabetes, anxiety who presents to the emergency department with continued vaginal intermittent burning  pain for the last 5 weeks.  She has been seen numerous times for this in the emergency department and her primary care and her OB/GYN's office.  She had an incidental finding of a vaginal rectocele but has not had any obvious infections.  These been treated  empirically for bacterial vaginosis and UTIs based on urinalysis.  She is seen by her primary care physician the day before yesterday and given a prescription for intravaginal antifungal cream and steroids which she has been using.  She still having intermittent  discomfort and is taking over-the-counter NSAIDs without much relief.  She does not have any other pain in any other part of her body and does not have any sore places in her mouth or throat.  She has not had any fevers or chills.  Nothing is changes  eating she is just frustrated.  She has an appointment tomorrow with her primary care physician.  She has an appointment the  following day with the OB/GYN specialist in [place].                          Past Medical History          Past Medical History:        Diagnosis  Date         ?  Anxiety       ?  Diabetes (HCC)       ?  Dyspepsia       ?  Emphysema       ?  Headache(784.0)       ?  Hypertension       ?  IGT (impaired glucose tolerance)       ?  Kidney stones       ?  Lipoma  Left calf         ?  Thyroid disease            hypothryoidism              Past Surgical History:         Procedure  Laterality  Date          ?  HX HEENT              Tonsillectomy at age 50              Family History         Problem  Relation  Age of Onset          ?  Heart Disease  Mother       ?  Other  Father                AAA              Social History          Tobacco Use         ?  Smoking status:  Current Every Day Smoker              Packs/day:  1.00         ?  Smokeless tobacco:  Never Used       Substance Use Topics         ?  Alcohol use:  No         ?  Drug use:  No              Allergies        Allergen  Reactions         ?  Ace Inhibitors  Cough     ?  Amoxicillin  Nausea and Vomiting     ?  Erythromycin  Other (comments)             Does not remember.         ?  Sulfa (Sulfonamide Antibiotics)  Nausea and Vomiting              No current facility-administered medications for this encounter.           Current Outpatient Medications        Medication  Sig         ?  metoprolol succinate (TOPROL-XL) 25 mg XL tablet  Take 1 Tab by mouth daily.     ?  atorvastatin (LIPITOR) 80 mg tablet  TAKE 1 TABLET BY MOUTH EVERY DAY     ?  FLUoxetine (PROZAC) 20 mg capsule  TAKE 1 CAPSULE BY MOUTH TWICE A DAY     ?  levothyroxine (SYNTHROID) 50 mcg tablet  TAKE 1 TABLET BY MOUTH EVERY DAY     ?  metFORMIN (GLUCOPHAGE) 1,000 mg tablet  TAKE 1&1/2 TABLETS EVERY MORNING AND 1 IN THE EVENING WITH A MEAL     ?  albuterol (PROVENTIL HFA, VENTOLIN HFA, PROAIR HFA) 90 mcg/actuation inhaler  Take 1 Puff by inhalation every six (6) hours as needed for  Wheezing. Indications: bronchospasm prevention     ?  cranberry extract 450 mg tab tablet  Take 450 mg by mouth.     ?  AZO CRANBERRY 450-30-50 mg-mg-million tab  Take  by mouth.     ?  aspirin (ASPIRIN) 325 mg tablet  Take 325 mg by mouth daily.     ?  ascorbic acid, vitamin C, (VITAMIN C) 500 mg tablet  Take  by mouth.     ?  vitamin E (AQUA GEMS) 400 unit capsule  Take 1,000 Units by mouth daily.     ?  acetaminophen (TYLENOL) 500 mg tablet  Take  by mouth every six (6) hours as needed for Pain.     ?  SODIUM CHLORIDE (SALINE NASAL NA)  by Nasal route as needed. 2 sprays each nostril every 12 hours.      ?  multivitamin with iron (DAILY MULTI-VITAMINS/IRON) tablet  Take 1 Tab by mouth daily.     ?  Cholecalciferol, Vitamin D3, (VITAMIN D3) 1,000 unit cap  Take 5,000 Units by mouth daily.     ?  cyanocobalamin 1,000 mcg tablet  Take 2,500 mcg by mouth every Monday and Thursday.         ?  omega-3 fatty acids-vitamin e (FISH OIL) 1,000 mg cap  Take 1 Cap by mouth four (4) times daily.                       Review of Systems     Review of Systems    Constitutional: Negative for chills, fatigue and fever.    HENT: Negative for congestion, rhinorrhea and sore throat.     Eyes: Negative for visual disturbance.    Respiratory: Negative for cough, shortness of breath and wheezing.     Cardiovascular: Negative for chest pain, palpitations and leg swelling.    Gastrointestinal: Negative for abdominal pain, diarrhea, nausea and vomiting.    Genitourinary:         See history of present illness    Musculoskeletal: Negative for arthralgias.    Skin: Negative for rash.    Neurological: Negative for weakness and headaches.    All other systems reviewed and are negative.                    Physical Exam     Vital Signs    Patient Vitals for the past 12 hrs:            Temp  Pulse  Resp  BP  SpO2            02/19/18 2145  97.8 ??F (36.6 ??C)  84  18  138/77  95 %           Physical Exam    Constitutional: She is oriented to person,  place, and time. She appears well-developed and well-nourished. No distress.   On examination this is a elderly Caucasian woman who is pleasant in  no apparent distress she sitting up on the edge of the bed.     HENT:    Head: Normocephalic and atraumatic.    Mouth/Throat: Oropharynx is clear and moist and mucous membranes are normal. No oropharyngeal exudate.    Eyes: Conjunctivae and EOM are normal. No scleral icterus.    Neck: Normal range of motion. Neck supple.    Cardiovascular: Normal rate, regular rhythm, S1 normal, S2 normal, normal heart sounds and intact distal pulses. Exam reveals no gallop and no friction rub.    No murmur heard.   Pulmonary/Chest: Effort normal and breath sounds normal. No accessory muscle usage. No respiratory distress. She has no wheezes. She has no rhonchi. She has no rales. She exhibits no tenderness.    Abdominal: Soft. Normal appearance and bowel sounds are normal. She exhibits no distension and no pulsatile midline  mass. There is no tenderness. There is no rebound and no guarding.   Genitourinary:   Genitourinary Comments:     I did a pelvic examination with nurse Lupita Leash present and assisting.  The external genitalia  appears completely normal.  There are no lesions.  The internal examination reveals a nontender rectocele into the posterior vaginal wall that is easily displaced.  Her cervix appears normal without any lesions and no discharge.  Swabs were taken for  gonorrhea and chlamydia and KOH and wet prep.  The vagina was filled with antifungal cream.  I cleaned this off to be able to do the examination.   Musculoskeletal: Normal range of motion.   Lymphadenopathy:        Head (right side): No submandibular adenopathy present.     She has no cervical adenopathy.   Neurological: She is alert and oriented to person, place, and time.   Moving all extremities. No obvious  deficits or facial asymmetry.     Skin: Skin is warm, dry and intact. No rash noted. No erythema.    Psychiatric: She has a normal mood and affect. Her speech  is normal and behavior is normal.    Nursing note and vitals reviewed.                    Labs, Radiology, EKG, Procedures, Etc.     Labs:     No results found for this or any previous visit (from the past 12 hour(s)).      Radiologic Studies:     No orders to display          CT Results   (Last 48 hours)          None                 CXR Results   (Last 48 hours)          None                  Pulse Oximetry Analysis: 95% on room air        Procedures:    Procedures                 Medical Decision Making     Old Medical Records: Old medical records.   Nursing notes.    Nursing Records: I reviewed available vital signs, nursing notes, PMHx, PSGHx, FMHx, SHx   Care Plan for ED Visit: I am the first provider for this patient's ED visit today.  Initial assessment performed. I discussed presenting  problems, concerns and my formulated plan for today's visit with the patient and any available family members at bedside. I encouraged them to ask questions as they arise throughout the visit.             ASSESSMENT / PLAN:        NANSI BIRMINGHAM is a 67 y.o. female with past medical history of hypertension, hypothyroid, diabetes, anxiety who presents to the emergency department with continued vaginal intermittent burning  pain for the last 5 weeks.  She has been seen numerous times for this in the emergency department and her primary care and her OB/GYN's office.  She had an incidental finding of a vaginal rectocele but has not had any obvious infections.  These been treated  empirically for bacterial vaginosis and UTIs based on urinalysis.  She is seen by her primary care physician the day before yesterday and given a prescription  for intravaginal antifungal cream and steroids which she has been using.  She still having intermittent  discomfort and is taking over-the-counter NSAIDs without much relief.  She does not have any other pain in any other part of her body  and does not have any sore places in her mouth or throat.  She has not had any fevers or chills.  Nothing is changes  eating she is just frustrated.  She has an appointment tomorrow with her primary care physician.  She has an appointment the following day with the OB/GYN specialist in [place].      On examination this is a elderly Caucasian woman who is pleasant in no apparent distress she sitting up on the edge of the bed.  HEENT, CV, lung, abdomen examination are benign.  I did a pelvic examination with nurse Lupita Leash present and assisting.  The  external genitalia appears completely normal.  There are no lesions.  The internal examination reveals a nontender rectocele into the posterior vaginal wall that is easily displaced.  Her cervix appears normal without any lesions and no discharge.  Swabs  were taken for gonorrhea and chlamydia and KOH and wet prep.  The vagina was filled with antifungal cream.  I cleaned this off to be able to do the examination.      She continues to have intermittent vaginal pain I am unsure of the cause.  There is no obvious signs of infection at this time.  There is no desquamation of the skin or fevers or chills or sloughing or other skin lesions to suggest CV Johnson syndrome  or toxic epidermal necrolysis or any other life-threatening rash.      -Swab sent for gonorrhea and chlamydia and KOH and wet prep   -Patient is discharged home   -She is to continue the medications her physicians have her on   -She is to go to her follow-up appointment with her primary care physician tomorrow as well as the specialist OB/GYN in [place] on Friday   -Return to the ER for any problems or concerns      Vaughan Browner, MD   EM-IM Physician            Medications Given in the ED:   Medications - No data to display                 Disposition     E.D. Clinical Impression/Diagnosis:      1.  Vaginal pain                   Disposition:   Home           Follow-up Information               Follow up  With  Specialties  Details  Why  Contact Info              Madison Palau, PA-C  Physician Assistant  Call in 1 day    [address]   [place] [address]   [phone]                 [facility] EMERGENCY DEP  Emergency Medicine    As needed, If symptoms worsen  [address]   [place] [address]   [phone]                  Current Discharge Medication List  CONTINUE these medications which have NOT CHANGED          Details        metoprolol succinate (TOPROL-XL) 25 mg XL tablet  Take 1 Tab by mouth daily.   Qty: 30 Tab, Refills:  0          Associated Diagnoses: Essential hypertension               atorvastatin (LIPITOR) 80 mg tablet  TAKE 1 TABLET BY MOUTH EVERY DAY   Qty: 90 Tab, Refills:  1          Associated Diagnoses: Hyperlipidemia, unspecified hyperlipidemia type               FLUoxetine (PROZAC) 20 mg capsule  TAKE 1 CAPSULE BY MOUTH TWICE A DAY   Qty: 180 Cap, Refills:  1          Associated Diagnoses: Anxiety               levothyroxine (SYNTHROID) 50 mcg tablet  TAKE 1 TABLET BY MOUTH EVERY DAY   Qty: 90 Tab, Refills:  1          Associated Diagnoses: Thyroid disease               metFORMIN (GLUCOPHAGE) 1,000 mg tablet  TAKE 1&1/2 TABLETS EVERY MORNING AND 1 IN THE EVENING WITH A MEAL   Qty: 225 Tab, Refills:  1          Associated Diagnoses: Type 2 diabetes mellitus without complication, without long-term current  use of insulin (HCC)               albuterol (PROVENTIL HFA, VENTOLIN HFA, PROAIR HFA) 90 mcg/actuation inhaler  Take 1 Puff by inhalation every six (6) hours as needed for Wheezing. Indications: bronchospasm prevention   Qty: 1 Inhaler, Refills:  5          Associated Diagnoses: Wheeze; Pulmonary emphysema, unspecified emphysema type (HCC)               cranberry extract 450 mg tab tablet  Take 450 mg by mouth.               AZO CRANBERRY 450-30-50 mg-mg-million tab  Take  by mouth.               aspirin (ASPIRIN) 325 mg tablet  Take 325 mg by mouth daily.                ascorbic acid, vitamin C, (VITAMIN C) 500 mg tablet  Take  by mouth.               vitamin E (AQUA GEMS) 400 unit capsule  Take 1,000 Units by mouth daily.               acetaminophen (TYLENOL) 500 mg tablet  Take  by mouth every six (6) hours as needed for Pain.               SODIUM CHLORIDE (SALINE NASAL NA)  by Nasal route as needed. 2 sprays each nostril every 12 hours.                multivitamin with iron (DAILY MULTI-VITAMINS/IRON) tablet  Take 1 Tab by mouth daily.               Cholecalciferol, Vitamin D3, (VITAMIN D3) 1,000 unit cap  Take 5,000 Units by mouth daily.  cyanocobalamin 1,000 mcg tablet  Take 2,500 mcg by mouth every Monday and Thursday.               omega-3 fatty acids-vitamin e (FISH OIL) 1,000 mg cap  Take 1 Cap by mouth four (4) times daily.                                  _______________________________    Attestations:    This note was performed by Vaughan Browner, MD in its entirety.   _______________________________

## 2018-02-19 NOTE — ED Triage Notes (Signed)
Pt sts she has had a vaginal yeast infection x 5wks.  Outpt therapy is not clearing up the condition. Pt c/o internal vaginal burning w/o discharge.  In addition, she was recently in ICU at [facility] x2d for hypoNa+ approx 3wks ago.  Pt is no longer menstruating.

## 2018-02-19 NOTE — ED Notes (Signed)
Wtr assisted and chaperoned Dr Hoffman during vaginal exam.  Cx and preps obtained.

## 2018-02-19 NOTE — ED Provider Notes (Signed)
Emergency Department History & Physical Examination        Patient Information   Date of Service: 02/19/2018   Patient Name: Madison Peters   Date of Birth: November 08, 1950  Medical Record Number: [id number]  Primary Care Provider: Marlan Palau, PA-C   Specialists:          History of Present Illness   History Provided By:  patient and spouse  Chief Complaint   Patient presents with   ??? Vaginal Pain     burning pain inside vagina w/o discharge x 5wks        History of Present Illness:        Madison Peters is a 67 y.o. female with past medical history of hypertension, hypothyroid, diabetes, anxiety who presents to the emergency department with continued vaginal intermittent burning pain for the last 5 weeks.  She has been seen numerous times for this in the emergency department and her primary care and her OB/GYN's office.  She had an incidental finding of a vaginal rectocele but has not had any obvious infections.  These been treated empirically for bacterial vaginosis and UTIs based on urinalysis.  She is seen by her primary care physician the day before yesterday and given a prescription for intravaginal antifungal cream and steroids which she has been using.  She still having intermittent discomfort and is taking over-the-counter NSAIDs without much relief.  She does not have any other pain in any other part of her body and does not have any sore places in her mouth or throat.  She has not had any fevers or chills.  Nothing is changes eating she is just frustrated.  She has an appointment tomorrow with her primary care physician.  She has an appointment the following day with the OB/GYN specialist in [place].                Past Medical History     Past Medical History:   Diagnosis Date   ??? Anxiety    ??? Diabetes (HCC)    ??? Dyspepsia    ??? Emphysema    ??? Headache(784.0)    ??? Hypertension    ??? IGT (impaired glucose tolerance)    ??? Kidney stones    ??? Lipoma     Left calf   ??? Thyroid disease     hypothryoidism         Past Surgical History:   Procedure Laterality Date   ??? HX HEENT      Tonsillectomy at age 72        Family History   Problem Relation Age of Onset   ??? Heart Disease Mother    ??? Other Father         AAA        Social History     Tobacco Use   ??? Smoking status: Current Every Day Smoker     Packs/day: 1.00   ??? Smokeless tobacco: Never Used   Substance Use Topics   ??? Alcohol use: No   ??? Drug use: No        Allergies   Allergen Reactions   ??? Ace Inhibitors Cough   ??? Amoxicillin Nausea and Vomiting   ??? Erythromycin Other (comments)     Does not remember.   ??? Sulfa (Sulfonamide Antibiotics) Nausea and Vomiting      No current facility-administered medications for this encounter.      Current Outpatient Medications   Medication Sig   ???  metoprolol succinate (TOPROL-XL) 25 mg XL tablet Take 1 Tab by mouth daily.   ??? atorvastatin (LIPITOR) 80 mg tablet TAKE 1 TABLET BY MOUTH EVERY DAY   ??? FLUoxetine (PROZAC) 20 mg capsule TAKE 1 CAPSULE BY MOUTH TWICE A DAY   ??? levothyroxine (SYNTHROID) 50 mcg tablet TAKE 1 TABLET BY MOUTH EVERY DAY   ??? metFORMIN (GLUCOPHAGE) 1,000 mg tablet TAKE 1&1/2 TABLETS EVERY MORNING AND 1 IN THE EVENING WITH A MEAL   ??? albuterol (PROVENTIL HFA, VENTOLIN HFA, PROAIR HFA) 90 mcg/actuation inhaler Take 1 Puff by inhalation every six (6) hours as needed for Wheezing. Indications: bronchospasm prevention   ??? cranberry extract 450 mg tab tablet Take 450 mg by mouth.   ??? AZO CRANBERRY 450-30-50 mg-mg-million tab Take  by mouth.   ??? aspirin (ASPIRIN) 325 mg tablet Take 325 mg by mouth daily.   ??? ascorbic acid, vitamin C, (VITAMIN C) 500 mg tablet Take  by mouth.   ??? vitamin E (AQUA GEMS) 400 unit capsule Take 1,000 Units by mouth daily.   ??? acetaminophen (TYLENOL) 500 mg tablet Take  by mouth every six (6) hours as needed for Pain.   ??? SODIUM CHLORIDE (SALINE NASAL NA) by Nasal route as needed. 2 sprays each nostril every 12 hours.    ??? multivitamin with iron (DAILY MULTI-VITAMINS/IRON) tablet Take 1 Tab by  mouth daily.   ??? Cholecalciferol, Vitamin D3, (VITAMIN D3) 1,000 unit cap Take 5,000 Units by mouth daily.   ??? cyanocobalamin 1,000 mcg tablet Take 2,500 mcg by mouth every Monday and Thursday.   ??? omega-3 fatty acids-vitamin e (FISH OIL) 1,000 mg cap Take 1 Cap by mouth four (4) times daily.              Review of Systems   Review of Systems   Constitutional: Negative for chills, fatigue and fever.   HENT: Negative for congestion, rhinorrhea and sore throat.    Eyes: Negative for visual disturbance.   Respiratory: Negative for cough, shortness of breath and wheezing.    Cardiovascular: Negative for chest pain, palpitations and leg swelling.   Gastrointestinal: Negative for abdominal pain, diarrhea, nausea and vomiting.   Genitourinary:        See history of present illness   Musculoskeletal: Negative for arthralgias.   Skin: Negative for rash.   Neurological: Negative for weakness and headaches.   All other systems reviewed and are negative.            Physical Exam   Vital Signs   Patient Vitals for the past 12 hrs:   Temp Pulse Resp BP SpO2   02/19/18 2145 97.8 ??F (36.6 ??C) 84 18 138/77 95 %       Physical Exam   Constitutional: She is oriented to person, place, and time. She appears well-developed and well-nourished. No distress.   On examination this is a elderly Caucasian woman who is pleasant in no apparent distress she sitting up on the edge of the bed.    HENT:   Head: Normocephalic and atraumatic.   Mouth/Throat: Oropharynx is clear and moist and mucous membranes are normal. No oropharyngeal exudate.   Eyes: Conjunctivae and EOM are normal. No scleral icterus.   Neck: Normal range of motion. Neck supple.   Cardiovascular: Normal rate, regular rhythm, S1 normal, S2 normal, normal heart sounds and intact distal pulses. Exam reveals no gallop and no friction rub.   No murmur heard.  Pulmonary/Chest: Effort normal and breath sounds  normal. No accessory  muscle usage. No respiratory distress. She has no wheezes. She has no rhonchi. She has no rales. She exhibits no tenderness.   Abdominal: Soft. Normal appearance and bowel sounds are normal. She exhibits no distension and no pulsatile midline mass. There is no tenderness. There is no rebound and no guarding.   Genitourinary:   Genitourinary Comments:     I did a pelvic examination with nurse Lupita Leash present and assisting.  The external genitalia appears completely normal.  There are no lesions.  The internal examination reveals a nontender rectocele into the posterior vaginal wall that is easily displaced.  Her cervix appears normal without any lesions and no discharge.  Swabs were taken for gonorrhea and chlamydia and KOH and wet prep.  The vagina was filled with antifungal cream.  I cleaned this off to be able to do the examination.   Musculoskeletal: Normal range of motion.   Lymphadenopathy:        Head (right side): No submandibular adenopathy present.     She has no cervical adenopathy.   Neurological: She is alert and oriented to person, place, and time.   Moving all extremities. No obvious deficits or facial asymmetry.    Skin: Skin is warm, dry and intact. No rash noted. No erythema.   Psychiatric: She has a normal mood and affect. Her speech is normal and behavior is normal.   Nursing note and vitals reviewed.            Labs, Radiology, EKG, Procedures, Etc.   Labs:    No results found for this or any previous visit (from the past 12 hour(s)).    Radiologic Studies:  No orders to display     CT Results  (Last 48 hours)    None        CXR Results  (Last 48 hours)    None          Pulse Oximetry Analysis: 95% on room air      Procedures:   Procedures          Medical Decision Making   Old Medical Records: Old medical records.  Nursing notes.   Nursing Records: I reviewed available vital signs, nursing notes, PMHx, PSGHx, FMHx, SHx  Care Plan for ED Visit: I am the first provider for this patient's ED  visit today.  Initial assessment performed. I discussed presenting problems, concerns and my formulated plan for today's visit with the patient and any available family members at bedside. I encouraged them to ask questions as they arise throughout the visit.       ASSESSMENT / PLAN:       Madison Peters is a 67 y.o. female with past medical history of hypertension, hypothyroid, diabetes, anxiety who presents to the emergency department with continued vaginal intermittent burning pain for the last 5 weeks.  She has been seen numerous times for this in the emergency department and her primary care and her OB/GYN's office.  She had an incidental finding of a vaginal rectocele but has not had any obvious infections.  These been treated empirically for bacterial vaginosis and UTIs based on urinalysis.  She is seen by her primary care physician the day before yesterday and given a prescription for intravaginal antifungal cream and steroids which she has been using.  She still having intermittent discomfort and is taking over-the-counter NSAIDs without much relief.  She does not have any other pain in any other part of her  body and does not have any sore places in her mouth or throat.  She has not had any fevers or chills.  Nothing is changes eating she is just frustrated.  She has an appointment tomorrow with her primary care physician.  She has an appointment the following day with the OB/GYN specialist in [place].    On examination this is a elderly Caucasian woman who is pleasant in no apparent distress she sitting up on the edge of the bed.  HEENT, CV, lung, abdomen examination are benign.  I did a pelvic examination with nurse Lupita Leash present and assisting.  The external genitalia appears completely normal.  There are no lesions.  The internal examination reveals a nontender rectocele into the posterior vaginal wall that is easily displaced.  Her cervix appears normal without any lesions and no  discharge.  Swabs were taken for gonorrhea and chlamydia and KOH and wet prep.  The vagina was filled with antifungal cream.  I cleaned this off to be able to do the examination.    She continues to have intermittent vaginal pain I am unsure of the cause.  There is no obvious signs of infection at this time.  There is no desquamation of the skin or fevers or chills or sloughing or other skin lesions to suggest CV Johnson syndrome or toxic epidermal necrolysis or any other life-threatening rash.    ???Swab sent for gonorrhea and chlamydia and KOH and wet prep  ???Patient is discharged home  ???She is to continue the medications her physicians have her on  ???She is to go to her follow-up appointment with her primary care physician tomorrow as well as the specialist OB/GYN in [place] on Friday  ???Return to the ER for any problems or concerns    Vaughan Browner, MD  EM-IM Physician        Medications Given in the ED:  Medications - No data to display          Disposition   E.D. Clinical Impression/Diagnosis:  1. Vaginal pain           Disposition:  Home      Follow-up Information     Follow up With Specialties Details Why Contact Info    Marlan Palau, PA-C Physician Assistant Call in 1 day  [address]  [place] [address]  [phone]      [facility] EMERGENCY DEP Emergency Medicine  As needed, If symptoms worsen [address]  [place] [address]  [phone]          Current Discharge Medication List      CONTINUE these medications which have NOT CHANGED    Details   metoprolol succinate (TOPROL-XL) 25 mg XL tablet Take 1 Tab by mouth daily.  Qty: 30 Tab, Refills: 0    Associated Diagnoses: Essential hypertension      atorvastatin (LIPITOR) 80 mg tablet TAKE 1 TABLET BY MOUTH EVERY DAY  Qty: 90 Tab, Refills: 1    Associated Diagnoses: Hyperlipidemia, unspecified hyperlipidemia type      FLUoxetine (PROZAC) 20 mg capsule TAKE 1 CAPSULE BY MOUTH TWICE A DAY  Qty: 180 Cap, Refills: 1     Associated Diagnoses: Anxiety      levothyroxine (SYNTHROID) 50 mcg tablet TAKE 1 TABLET BY MOUTH EVERY DAY  Qty: 90 Tab, Refills: 1    Associated Diagnoses: Thyroid disease      metFORMIN (GLUCOPHAGE) 1,000 mg tablet TAKE 1&1/2 TABLETS EVERY MORNING AND 1 IN THE  EVENING WITH A MEAL  Qty: 225 Tab, Refills: 1    Associated Diagnoses: Type 2 diabetes mellitus without complication, without long-term current use of insulin (HCC)      albuterol (PROVENTIL HFA, VENTOLIN HFA, PROAIR HFA) 90 mcg/actuation inhaler Take 1 Puff by inhalation every six (6) hours as needed for Wheezing. Indications: bronchospasm prevention  Qty: 1 Inhaler, Refills: 5    Associated Diagnoses: Wheeze; Pulmonary emphysema, unspecified emphysema type (HCC)      cranberry extract 450 mg tab tablet Take 450 mg by mouth.      AZO CRANBERRY 450-30-50 mg-mg-million tab Take  by mouth.      aspirin (ASPIRIN) 325 mg tablet Take 325 mg by mouth daily.      ascorbic acid, vitamin C, (VITAMIN C) 500 mg tablet Take  by mouth.      vitamin E (AQUA GEMS) 400 unit capsule Take 1,000 Units by mouth daily.      acetaminophen (TYLENOL) 500 mg tablet Take  by mouth every six (6) hours as needed for Pain.      SODIUM CHLORIDE (SALINE NASAL NA) by Nasal route as needed. 2 sprays each nostril every 12 hours.       multivitamin with iron (DAILY MULTI-VITAMINS/IRON) tablet Take 1 Tab by mouth daily.      Cholecalciferol, Vitamin D3, (VITAMIN D3) 1,000 unit cap Take 5,000 Units by mouth daily.      cyanocobalamin 1,000 mcg tablet Take 2,500 mcg by mouth every Monday and Thursday.      omega-3 fatty acids-vitamin e (FISH OIL) 1,000 mg cap Take 1 Cap by mouth four (4) times daily.                   _______________________________   Attestations:   This note was performed by Vaughan Browner, MD in its entirety.  _______________________________

## 2018-02-20 ENCOUNTER — Inpatient Hospital Stay
Admit: 2018-02-20 | Discharge: 2018-02-20 | Disposition: A | Discharge status: Home / Self Care | Payer: BLUE CROSS/BLUE SHIELD | Attending: Emergency Medicine

## 2018-02-20 LAB — WET PREP
Clue Cells: ABSENT
Clue cells: ABSENT
Wet Prep, Trich: NONE SEEN
Wet prep: NONE SEEN

## 2018-02-20 NOTE — ED Notes (Signed)
Reviewed discharge instructions with pt, no questions at this time. Pt ambulatory with steady gait upon discharge.

## 2018-02-20 NOTE — ED Notes (Signed)
Reviewed discharge instructions with pt, no questions at this time. Pt ambulatory with steady gait upon discharge.

## 2018-02-21 LAB — CHLAMYDIA/GC PCR
CHLAmydia Amplified: NEGATIVE
Chlamydia amplified: NEGATIVE
N gonorrhoeae Amp.: NEGATIVE
N. gonorrhea, amplified: NEGATIVE

## 2018-02-21 LAB — KOH, OTHER SOURCES
KOH: NONE SEEN
KOH: NONE SEEN

## 2018-04-04 ENCOUNTER — Ambulatory Visit: Attending: Physician Assistant | Primary: Physician Assistant

## 2018-04-04 ENCOUNTER — Ambulatory Visit: Admit: 2018-04-04 | Discharge: 2018-04-04 | Attending: Physician Assistant | Primary: Physician Assistant

## 2018-04-04 DIAGNOSIS — Z79899 Other long term (current) drug therapy: Secondary | ICD-10-CM

## 2018-04-04 LAB — AMB POC URINALYSIS DIP STICK MANUAL W/O MICRO
Bilirubin (UA POC): NEGATIVE
Bilirubin, Urine, POC: NEGATIVE
Blood (UA POC): NEGATIVE
Blood (UA POC): NEGATIVE
Glucose (UA POC): NEGATIVE
Glucose, Urine, POC: NEGATIVE
Ketones (UA POC): NEGATIVE
Ketones, Urine, POC: NEGATIVE
Leukocyte Esterase, Urine, POC: NEGATIVE
Leukocyte esterase (UA POC): NEGATIVE
Nitrite, Urine, POC: NEGATIVE
Nitrites (UA POC): NEGATIVE
Specific Gravity, Urine, POC: 1.02 NA (ref 1.001–1.035)
Specific gravity (UA POC): 1.02 (ref 1.001–1.035)
Urobilinogen (UA POC): NORMAL (ref 0.2–1)
Urobilinogen, POC: NORMAL (ref 0.2–1)
pH (UA POC): 5.5 (ref 4.6–8.0)
pH, Urine, POC: 5.5 NA (ref 4.6–8.0)

## 2018-04-04 NOTE — Progress Notes (Signed)
Madison Peters is a 67 y.o. female who presents to the office today with the following:  Chief Complaint   Patient presents with   ??? Medication Evaluation   ??? Blood Pressure Check     fluctuates       HPI  Pt would like to review all of her medications today.  She wants to be sure she is taking everything correctly.  Is not sure why she is taking some vitamins and others.     Also, pt quit smoking in May.  Has been doing very well.  Since her BP has been dropping.  Ran out of her Metoprolol 25 mg 3-4 days ago. Upon review today she also had an old 50 mg she was taking with it.   Some days has been very low, systolic down 104/61.  Sometimes pt will feel lightheaded when this happens.  When she does not take her BP medication her BP remains wnl.  Today she has not had any and is 120s/60s.  No cardiac complaints.     Having some urinary burning again and some minor pain in right side as she has had with UTIs.   Has had burning w/o UTI.   Has been under care of GYN for rectocele, urinary frequency, and atrophic vaginitis, and hemorrhoids.  Has f/u on 12th again as a check up. Pt states she is probably going to have full pelvic with repeat testing as well.  No other GU sxs.  Is having some constipation.    Reviewed medications and updated in chart.   Pt wants to continue fish oil for triglycerides but wants to d/c other vitamins for now.  She does not recall ever having a deficiency or being told to take them.  She is unsure if she ever went for DEXA, it was ordered last year. Will send MR to [facility], as I cannot find that this was done.     Otherwise feeling well with no other complaints or acute concerns.      Review of Systems   Constitutional: Negative for chills and fever.   Gastrointestinal: Positive for constipation.   Genitourinary: Negative for flank pain, hematuria and urgency.        No vaginal discharge   Skin: Negative for rash.     See HPI.    Past Medical History:   Diagnosis Date   ??? Anxiety    ???  Diabetes (HCC)    ??? Dyspepsia    ??? Emphysema    ??? Headache(784.0)    ??? Hypertension    ??? IGT (impaired glucose tolerance)    ??? Kidney stones    ??? Lipoma     Left calf   ??? Thyroid disease     hypothryoidism       Past Surgical History:   Procedure Laterality Date   ??? HX HEENT      Tonsillectomy at age 4       Allergies   Allergen Reactions   ??? Ace Inhibitors Cough   ??? Amoxicillin Nausea and Vomiting   ??? Erythromycin Other (comments)     Does not remember.   ??? Sulfa (Sulfonamide Antibiotics) Nausea and Vomiting       Current Outpatient Medications   Medication Sig   ??? atorvastatin (LIPITOR) 80 mg tablet TAKE 1 TABLET BY MOUTH EVERY DAY   ??? FLUoxetine (PROZAC) 20 mg capsule TAKE 1 CAPSULE BY MOUTH TWICE A DAY   ??? levothyroxine (SYNTHROID) 50 mcg tablet TAKE  1 TABLET BY MOUTH EVERY DAY   ??? metFORMIN (GLUCOPHAGE) 1,000 mg tablet TAKE 1&1/2 TABLETS EVERY MORNING AND 1 IN THE EVENING WITH A MEAL   ??? albuterol (PROVENTIL HFA, VENTOLIN HFA, PROAIR HFA) 90 mcg/actuation inhaler Take 1 Puff by inhalation every six (6) hours as needed for Wheezing. Indications: bronchospasm prevention   ??? acetaminophen (TYLENOL) 500 mg tablet Take  by mouth every six (6) hours as needed for Pain.   ??? omega-3 fatty acids-vitamin e (FISH OIL) 1,000 mg cap Take 1 Cap by mouth four (4) times daily.   ??? metoprolol succinate (TOPROL-XL) 25 mg XL tablet Take 1 Tab by mouth daily.     No current facility-administered medications for this visit.        Social History     Socioeconomic History   ??? Marital status: MARRIED     Spouse name: Not on file   ??? Number of children: Not on file   ??? Years of education: Not on file   ??? Highest education level: Not on file   Tobacco Use   ??? Smoking status: Former Smoker     Packs/day: 1.00     Last attempt to quit: 01/28/2018     Years since quitting: 0.1   ??? Smokeless tobacco: Never Used   Substance and Sexual Activity   ??? Alcohol use: No   ??? Drug use: No   ??? Sexual activity: Yes     Partners: Male       Family  History   Problem Relation Age of Onset   ??? Heart Disease Mother    ??? Other Father         AAA         Physical Exam:  Visit Vitals  BP 124/69 (BP 1 Location: Left arm, BP Patient Position: At rest)   Pulse 71   Temp 97.6 ??F (36.4 ??C)   Resp 16   Ht 5\' 7"  (1.702 m)   Wt 165 lb (74.8 kg)   SpO2 97%   BMI 25.84 kg/m??     Physical Exam   Constitutional: She is oriented to person, place, and time and well-developed, well-nourished, and in no distress.   HENT:   Head: Normocephalic and atraumatic.   Eyes: Conjunctivae are normal.   Neck: Neck supple.   Cardiovascular: Normal rate and regular rhythm.   Pulmonary/Chest: Effort normal and breath sounds normal.   Abdominal: Soft. She exhibits no distension. There is no tenderness. There is no rebound and no guarding.   What feels like hard stool along right side of abdomen, pt reports mild discomfort with deep palpation. No definitive mass or other abnls. Pt instructed to tx constipation and keep eye on this. She will have GYN recheck at f/u soon and rtc here also if does not resolve or worsens.   Musculoskeletal: Normal range of motion.   Neurological: She is alert and oriented to person, place, and time. Gait normal.   Skin: Skin is warm and dry.   Psychiatric: Mood and affect normal.   Nursing note and vitals reviewed.      Assessment/Plan:    ICD-10-CM ICD-9-CM    1. Encounter for medication review Z79.899 V58.69    2. Vaginal burning N94.9 625.8 AMB POC URINALYSIS DIP STICK MANUAL W/O MICRO   3. Constipation, unspecified constipation type K59.00 564.00    4. Essential hypertension I10 401.9    5. Hypotension due to drugs I95.2 458.8      E947.9  Follow-up and Dispositions    ?? Return in about 1 month (around 05/02/2018), or sooner prn for if sxs persist/worsen or has new concerns.        Results for orders placed or performed in visit on 04/04/18   AMB POC URINALYSIS DIP STICK MANUAL W/O MICRO   Result Value Ref Range    Color (UA POC) Yellow Yellow    Clarity (UA  POC) Clear Clear    Glucose (UA POC) Negative Negative    Bilirubin (UA POC) Negative Negative    Ketones (UA POC) Negative Negative    Specific gravity (UA POC) 1.020 1.001 - 1.035    Blood (UA POC) Negative Negative    pH (UA POC) 5.5 4.6 - 8.0    Protein (UA POC) Trace Negative    Urobilinogen (UA POC) normal 0.2 - 1    Nitrites (UA POC) Negative Negative    Leukocyte esterase (UA POC) Negative Negative     Keep planned f/u with GYN.  Plans to return here at end of the month for FBW.  Reviewed medication and regimen.   Pt will hold her metoprolol altogether and keep BP log, to let me know if becomes elevated again and may resume the 25 mg (will need refill).   Otherwise seek care in interim for any new sxs or other concerns.  Pt verbalizes understanding and agrees with the plan.    Marlan PalauSarah S. Jenkins, PA-C

## 2018-04-04 NOTE — Progress Notes (Signed)
Madison Peters is a 67 y.o. female who presents to the office today with the following:  Chief Complaint   Patient presents with   ??? Medication Evaluation   ??? Blood Pressure Check     fluctuates       HPI  Pt would like to review all of her medications today.  She wants to be sure she is taking everything correctly.  Is not sure why she is taking some vitamins and others.     Also, pt quit smoking in May.  Has been doing very well.  Since her BP has been dropping.  Ran out of her Metoprolol 25 mg 3-4 days ago. Upon review today she also had an old 50 mg she was taking with it.   Some days has been very low, systolic down 104/61.  Sometimes pt will feel lightheaded when this happens.  When she does not take her BP medication her BP remains wnl.  Today she has not had any and is 120s/60s.  No cardiac complaints.     Having some urinary burning again and some minor pain in right side as she has had with UTIs.   Has had burning w/o UTI.   Has been under care of GYN for rectocele, urinary frequency, and atrophic vaginitis, and hemorrhoids.  Has f/u on 12th again as a check up. Pt states she is probably going to have full pelvic with repeat testing as well.  No other GU sxs.  Is having some constipation.    Reviewed medications and updated in chart.   Pt wants to continue fish oil for triglycerides but wants to d/c other vitamins for now.  She does not recall ever having a deficiency or being told to take them.  She is unsure if she ever went for DEXA, it was ordered last year. Will send MR to [facility], as I cannot find that this was done.     Otherwise feeling well with no other complaints or acute concerns.      Review of Systems   Constitutional: Negative for chills and fever.   Gastrointestinal: Positive for constipation.   Genitourinary: Negative for flank pain, hematuria and urgency.        No vaginal discharge   Skin: Negative for rash.     See HPI.    Past Medical History:   Diagnosis Date   ??? Anxiety     ??? Diabetes (HCC)    ??? Dyspepsia    ??? Emphysema    ??? Headache(784.0)    ??? Hypertension    ??? IGT (impaired glucose tolerance)    ??? Kidney stones    ??? Lipoma     Left calf   ??? Thyroid disease     hypothryoidism       Past Surgical History:   Procedure Laterality Date   ??? HX HEENT      Tonsillectomy at age 62       Allergies   Allergen Reactions   ??? Ace Inhibitors Cough   ??? Amoxicillin Nausea and Vomiting   ??? Erythromycin Other (comments)     Does not remember.   ??? Sulfa (Sulfonamide Antibiotics) Nausea and Vomiting       Current Outpatient Medications   Medication Sig   ??? atorvastatin (LIPITOR) 80 mg tablet TAKE 1 TABLET BY MOUTH EVERY DAY   ??? FLUoxetine (PROZAC) 20 mg capsule TAKE 1 CAPSULE BY MOUTH TWICE A DAY   ??? levothyroxine (SYNTHROID) 50 mcg tablet TAKE  1 TABLET BY MOUTH EVERY DAY   ??? metFORMIN (GLUCOPHAGE) 1,000 mg tablet TAKE 1&1/2 TABLETS EVERY MORNING AND 1 IN THE EVENING WITH A MEAL   ??? albuterol (PROVENTIL HFA, VENTOLIN HFA, PROAIR HFA) 90 mcg/actuation inhaler Take 1 Puff by inhalation every six (6) hours as needed for Wheezing. Indications: bronchospasm prevention   ??? acetaminophen (TYLENOL) 500 mg tablet Take  by mouth every six (6) hours as needed for Pain.   ??? omega-3 fatty acids-vitamin e (FISH OIL) 1,000 mg cap Take 1 Cap by mouth four (4) times daily.   ??? metoprolol succinate (TOPROL-XL) 25 mg XL tablet Take 1 Tab by mouth daily.     No current facility-administered medications for this visit.        Social History     Socioeconomic History   ??? Marital status: MARRIED     Spouse name: Not on file   ??? Number of children: Not on file   ??? Years of education: Not on file   ??? Highest education level: Not on file   Tobacco Use   ??? Smoking status: Former Smoker     Packs/day: 1.00     Last attempt to quit: 01/28/2018     Years since quitting: 0.1   ??? Smokeless tobacco: Never Used   Substance and Sexual Activity   ??? Alcohol use: No   ??? Drug use: No   ??? Sexual activity: Yes     Partners: Male        Family History   Problem Relation Age of Onset   ??? Heart Disease Mother    ??? Other Father         AAA         Physical Exam:  Visit Vitals  BP 124/69 (BP 1 Location: Left arm, BP Patient Position: At rest)   Pulse 71   Temp 97.6 ??F (36.4 ??C)   Resp 16   Ht 5\' 7"  (1.702 m)   Wt 165 lb (74.8 kg)   SpO2 97%   BMI 25.84 kg/m??     Physical Exam   Constitutional: She is oriented to person, place, and time and well-developed, well-nourished, and in no distress.   HENT:   Head: Normocephalic and atraumatic.   Eyes: Conjunctivae are normal.   Neck: Neck supple.   Cardiovascular: Normal rate and regular rhythm.   Pulmonary/Chest: Effort normal and breath sounds normal.   Abdominal: Soft. She exhibits no distension. There is no tenderness. There is no rebound and no guarding.   What feels like hard stool along right side of abdomen, pt reports mild discomfort with deep palpation. No definitive mass or other abnls. Pt instructed to tx constipation and keep eye on this. She will have GYN recheck at f/u soon and rtc here also if does not resolve or worsens.   Musculoskeletal: Normal range of motion.   Neurological: She is alert and oriented to person, place, and time. Gait normal.   Skin: Skin is warm and dry.   Psychiatric: Mood and affect normal.   Nursing note and vitals reviewed.      Assessment/Plan:    ICD-10-CM ICD-9-CM    1. Encounter for medication review Z79.899 V58.69    2. Vaginal burning N94.9 625.8 AMB POC URINALYSIS DIP STICK MANUAL W/O MICRO   3. Constipation, unspecified constipation type K59.00 564.00    4. Essential hypertension I10 401.9    5. Hypotension due to drugs I95.2 458.8      E947.9  Follow-up and Dispositions    ?? Return in about 1 month (around 05/02/2018), or sooner prn for if sxs persist/worsen or has new concerns.        Results for orders placed or performed in visit on 04/04/18   AMB POC URINALYSIS DIP STICK MANUAL W/O MICRO   Result Value Ref Range    Color (UA POC) Yellow Yellow     Clarity (UA POC) Clear Clear    Glucose (UA POC) Negative Negative    Bilirubin (UA POC) Negative Negative    Ketones (UA POC) Negative Negative    Specific gravity (UA POC) 1.020 1.001 - 1.035    Blood (UA POC) Negative Negative    pH (UA POC) 5.5 4.6 - 8.0    Protein (UA POC) Trace Negative    Urobilinogen (UA POC) normal 0.2 - 1    Nitrites (UA POC) Negative Negative    Leukocyte esterase (UA POC) Negative Negative     Keep planned f/u with GYN.  Plans to return here at end of the month for FBW.  Reviewed medication and regimen.   Pt will hold her metoprolol altogether and keep BP log, to let me know if becomes elevated again and may resume the 25 mg (will need refill).   Otherwise seek care in interim for any new sxs or other concerns.  Pt verbalizes understanding and agrees with the plan.    Marlan Palau, PA-C

## 2018-04-04 NOTE — Patient Instructions (Signed)
Constipation: Care Instructions  Your Care Instructions    Constipation means that you have a hard time passing stools (bowel movements). People pass stools from 3 times a day to once every 3 days. What is normal for you may be different. Constipation may occur with pain in the rectum and cramping. The pain may get worse when you try to pass stools. Sometimes there are small amounts of bright red blood on toilet paper or the surface of stools. This is because of enlarged veins near the rectum (hemorrhoids).  A few changes in your diet and lifestyle may help you avoid ongoing constipation. Your doctor may also prescribe medicine to help loosen your stool.  Some medicines can cause constipation. These include pain medicines and antidepressants. Tell your doctor about all the medicines you take. Your doctor may want to make a medicine change to ease your symptoms.  Follow-up care is a key part of your treatment and safety. Be sure to make and go to all appointments, and call your doctor if you are having problems. It's also a good idea to know your test results and keep a list of the medicines you take.  How can you care for yourself at home?  ?? Drink plenty of fluids, enough so that your urine is light yellow or clear like water. If you have kidney, heart, or liver disease and have to limit fluids, talk with your doctor before you increase the amount of fluids you drink.  ?? Include high-fiber foods in your diet each day. These include fruits, vegetables, beans, and whole grains.  ?? Get at least 30 minutes of exercise on most days of the week. Walking is a good choice. You also may want to do other activities, such as running, swimming, cycling, or playing tennis or team sports.  ?? Take a fiber supplement, such as Citrucel or Metamucil, every day. Read and follow all instructions on the label.  ?? Schedule time each day for a bowel movement. A daily routine may help. Take your time having your bowel movement.   ?? Support your feet with a small step stool when you sit on the toilet. This helps flex your hips and places your pelvis in a squatting position.  ?? Your doctor may recommend an over-the-counter laxative to relieve your constipation. Examples are Milk of Magnesia and MiraLax. Read and follow all instructions on the label. Do not use laxatives on a long-term basis.  When should you call for help?  Call your doctor now or seek immediate medical care if:  ?? ?? You have new or worse belly pain.   ?? ?? You have new or worse nausea or vomiting.   ?? ?? You have blood in your stools.   ??Watch closely for changes in your health, and be sure to contact your doctor if:  ?? ?? Your constipation is getting worse.   ?? ?? You do not get better as expected.   Where can you learn more?  Go to http://www.healthwise.net/[employer].  Enter P343 in the search box to learn more about "Constipation: Care Instructions."  Current as of: May 26, 2017  Content Version: 12.1  ?? 2006-2019 [employer]. Care instructions adapted under license by [employer] (which disclaims liability or warranty for this information). If you have questions about a medical condition or this instruction, always ask your healthcare professional. [employer]wise[employer] Inc[employer] disclaims any warranty or liability for your use of this information.

## 2018-04-08 ENCOUNTER — Ambulatory Visit: Attending: Physician Assistant | Primary: Physician Assistant

## 2018-04-08 ENCOUNTER — Ambulatory Visit: Admit: 2018-04-08 | Discharge: 2018-04-08 | Attending: Physician Assistant | Primary: Physician Assistant

## 2018-04-08 DIAGNOSIS — J019 Acute sinusitis, unspecified: Secondary | ICD-10-CM

## 2018-04-08 MED ORDER — NEOMYCIN-POLYMYXIN-HC 3.5 MG-10,000 UNIT/ML-1 % EAR DROPS, SUSP
3.5-10000-1 mg/mL-unit/mL-% | Freq: Four times a day (QID) | OTIC | 0 refills | Status: AC
Start: 2018-04-08 — End: 2018-04-18

## 2018-04-08 MED ORDER — AMOXICILLIN 875 MG TAB
875 mg | ORAL_TABLET | Freq: Two times a day (BID) | ORAL | 0 refills | Status: AC
Start: 2018-04-08 — End: 2018-04-18

## 2018-04-08 NOTE — Progress Notes (Signed)
Madison Peters is a 67 y.o. female who presents to the office today with the following:  Chief Complaint   Patient presents with   ??? Ear Pain   ??? Cough   ??? Hoarse       HPI  Sick since Saturday morning x 4 days.  Bilateral ear pain, coughing, sore throat, sinus pain/congestion, headache.   The cough is productive of clear phlegm.  Taking Tylenol around the clock, no other sxs.  No sick contacts.  No travel.    Pt notes her constipation resolved since last visit.  No longer feels any mass/hard stool in right abdomen since.  No other GI or GU sxs or complaints today.    Review of Systems   Constitutional: Positive for fever (on sat/sunday 101.22F resolved). Negative for chills.   HENT: Positive for congestion, ear pain, sinus pain and sore throat.    Gastrointestinal: Negative.    Musculoskeletal: Negative for myalgias.   Neurological: Positive for headaches.     See HPI.    Past Medical History:   Diagnosis Date   ??? Anxiety    ??? Diabetes (HCC)    ??? Dyspepsia    ??? Emphysema    ??? Headache(784.0)    ??? Hypertension    ??? IGT (impaired glucose tolerance)    ??? Kidney stones    ??? Lipoma     Left calf   ??? Thyroid disease     hypothryoidism       Past Surgical History:   Procedure Laterality Date   ??? HX HEENT      Tonsillectomy at age 59       Allergies   Allergen Reactions   ??? Ace Inhibitors Cough   ??? Amoxicillin Nausea and Vomiting   ??? Erythromycin Other (comments)     Does not remember.   ??? Sulfa (Sulfonamide Antibiotics) Nausea and Vomiting       Current Outpatient Medications   Medication Sig   ??? multivitamin (ONE A DAY) tablet Take 1 Tab by mouth daily.   ??? neomycin-polymyxin-hydrocortisone, buffered, (PEDIOTIC) 3.5-10,000-1 mg/mL-unit/mL-% otic suspension Administer 3 Drops into each ear four (4) times daily for 10 days.   ??? amoxicillin (AMOXIL) 875 mg tablet Take 1 Tab by mouth two (2) times a day for 10 days.   ??? atorvastatin (LIPITOR) 80 mg tablet TAKE 1 TABLET BY MOUTH EVERY DAY   ??? FLUoxetine (PROZAC) 20 mg capsule  TAKE 1 CAPSULE BY MOUTH TWICE A DAY   ??? levothyroxine (SYNTHROID) 50 mcg tablet TAKE 1 TABLET BY MOUTH EVERY DAY   ??? metFORMIN (GLUCOPHAGE) 1,000 mg tablet TAKE 1&1/2 TABLETS EVERY MORNING AND 1 IN THE EVENING WITH A MEAL   ??? albuterol (PROVENTIL HFA, VENTOLIN HFA, PROAIR HFA) 90 mcg/actuation inhaler Take 1 Puff by inhalation every six (6) hours as needed for Wheezing. Indications: bronchospasm prevention   ??? acetaminophen (TYLENOL) 500 mg tablet Take  by mouth every six (6) hours as needed for Pain.   ??? omega-3 fatty acids-vitamin e (FISH OIL) 1,000 mg cap Take 1 Cap by mouth four (4) times daily.     No current facility-administered medications for this visit.        Social History     Socioeconomic History   ??? Marital status: MARRIED     Spouse name: Not on file   ??? Number of children: Not on file   ??? Years of education: Not on file   ??? Highest education level: Not  on file   Tobacco Use   ??? Smoking status: Former Smoker     Packs/day: 1.00     Last attempt to quit: 01/28/2018     Years since quitting: 0.1   ??? Smokeless tobacco: Never Used   Substance and Sexual Activity   ??? Alcohol use: No   ??? Drug use: No   ??? Sexual activity: Yes     Partners: Male       Family History   Problem Relation Age of Onset   ??? Heart Disease Mother    ??? Other Father         AAA         Physical Exam:  Visit Vitals  BP 140/80 (BP 1 Location: Left arm, BP Patient Position: At rest)   Pulse 86   Temp 97.8 ??F (36.6 ??C) (Oral)   Resp 18   Ht 5\' 7"  (1.702 m)   Wt 165 lb (74.8 kg)   SpO2 96%   BMI 25.84 kg/m??     Physical Exam   Constitutional: She is oriented to person, place, and time and well-developed, well-nourished, and in no distress.   HENT:   Head: Normocephalic and atraumatic.   Nose: Mucosal edema present. Right sinus exhibits maxillary sinus tenderness. Right sinus exhibits no frontal sinus tenderness. Left sinus exhibits maxillary sinus tenderness. Left sinus exhibits no frontal sinus tenderness.   Mouth/Throat: Oropharynx is  clear and moist.   Bilateral cerumen impaction.   Eyes: Conjunctivae are normal.   Neck: Normal range of motion. Neck supple.   Cardiovascular: Normal rate and regular rhythm.   Pulmonary/Chest: Effort normal.   Faint bibasilar rhonchi   Abdominal: Soft. She exhibits no distension and no mass. There is no tenderness. There is no rebound and no guarding.   Musculoskeletal: Normal range of motion.   Lymphadenopathy:     She has no cervical adenopathy.   Neurological: She is alert and oriented to person, place, and time. Gait normal.   Skin: Skin is warm and dry.   Psychiatric: Mood and affect normal.   Nursing note and vitals reviewed.      Assessment/Plan:    ICD-10-CM ICD-9-CM    1. Acute non-recurrent sinusitis, unspecified location J01.90 461.9 amoxicillin (AMOXIL) 875 mg tablet   2. Acute otitis externa of both ears, unspecified type H60.503 380.10 neomycin-polymyxin-hydrocortisone, buffered, (PEDIOTIC) 3.5-10,000-1 mg/mL-unit/mL-% otic suspension      REMOVAL IMPACTED CERUMEN IRRIGATION/LVG UNILAT     Pt tolerated irrigation w/o difficulty. Ears clear, hearing intact to finger rub bilat, TMs unremarkable with only clear fluid. Canals are irritated. There is some minor swelling and pain with pinna manipulation. Pt would really like to try some drops.  Sinusitis may be viral. Pt will proceed with oral abx if no improvement on conservative tx.  Encourage rest & fluids. Humidifier, warm compresses, nasal spray, decongestants.  Discussed otc medications for symptomatic relief.  RTO/seek medical attn if sxs persist/worsen or develops any additional sxs/concerns.    Pt verbalizes understanding and agrees with the plan.    Marlan PalauSarah S. Jenkins, PA-C

## 2018-04-08 NOTE — Progress Notes (Signed)
 Visit Vitals  BP 140/80 (BP 1 Location: Left arm, BP Patient Position: At rest)   Pulse 86   Temp 97.8 F (36.6 C) (Oral)   Resp 18   Ht 5' 7 (1.702 m)   Wt 165 lb (74.8 kg)   SpO2 96%   BMI 25.84 kg/m     Chief Complaint   Patient presents with   . Ear Pain   . Cough   . Hoarse     1. Have you been to the ER, urgent care clinic since your last visit?  Hospitalized since your last visit?No    2. Have you seen or consulted any other health care providers outside of the [facility] since your last visit?  Include any pap smears or colon screening. No

## 2018-04-08 NOTE — Progress Notes (Signed)
Madison Peters is a 67 y.o. female who presents to the office today with the following:  Chief Complaint   Patient presents with   ??? Ear Pain   ??? Cough   ??? Hoarse       HPI  Sick since Saturday morning x 4 days.  Bilateral ear pain, coughing, sore throat, sinus pain/congestion, headache.   The cough is productive of clear phlegm.  Taking Tylenol around the clock, no other sxs.  No sick contacts.  No travel.    Pt notes her constipation resolved since last visit.  No longer feels any mass/hard stool in right abdomen since.  No other GI or GU sxs or complaints today.    Review of Systems   Constitutional: Positive for fever (on sat/sunday 101.41F resolved). Negative for chills.   HENT: Positive for congestion, ear pain, sinus pain and sore throat.    Gastrointestinal: Negative.    Musculoskeletal: Negative for myalgias.   Neurological: Positive for headaches.     See HPI.    Past Medical History:   Diagnosis Date   ??? Anxiety    ??? Diabetes (HCC)    ??? Dyspepsia    ??? Emphysema    ??? Headache(784.0)    ??? Hypertension    ??? IGT (impaired glucose tolerance)    ??? Kidney stones    ??? Lipoma     Left calf   ??? Thyroid disease     hypothryoidism       Past Surgical History:   Procedure Laterality Date   ??? HX HEENT      Tonsillectomy at age 79       Allergies   Allergen Reactions   ??? Ace Inhibitors Cough   ??? Amoxicillin Nausea and Vomiting   ??? Erythromycin Other (comments)     Does not remember.   ??? Sulfa (Sulfonamide Antibiotics) Nausea and Vomiting       Current Outpatient Medications   Medication Sig   ??? multivitamin (ONE A DAY) tablet Take 1 Tab by mouth daily.   ??? neomycin-polymyxin-hydrocortisone, buffered, (PEDIOTIC) 3.5-10,000-1 mg/mL-unit/mL-% otic suspension Administer 3 Drops into each ear four (4) times daily for 10 days.   ??? amoxicillin (AMOXIL) 875 mg tablet Take 1 Tab by mouth two (2) times a day for 10 days.   ??? atorvastatin (LIPITOR) 80 mg tablet TAKE 1 TABLET BY MOUTH EVERY DAY    ??? FLUoxetine (PROZAC) 20 mg capsule TAKE 1 CAPSULE BY MOUTH TWICE A DAY   ??? levothyroxine (SYNTHROID) 50 mcg tablet TAKE 1 TABLET BY MOUTH EVERY DAY   ??? metFORMIN (GLUCOPHAGE) 1,000 mg tablet TAKE 1&1/2 TABLETS EVERY MORNING AND 1 IN THE EVENING WITH A MEAL   ??? albuterol (PROVENTIL HFA, VENTOLIN HFA, PROAIR HFA) 90 mcg/actuation inhaler Take 1 Puff by inhalation every six (6) hours as needed for Wheezing. Indications: bronchospasm prevention   ??? acetaminophen (TYLENOL) 500 mg tablet Take  by mouth every six (6) hours as needed for Pain.   ??? omega-3 fatty acids-vitamin e (FISH OIL) 1,000 mg cap Take 1 Cap by mouth four (4) times daily.     No current facility-administered medications for this visit.        Social History     Socioeconomic History   ??? Marital status: MARRIED     Spouse name: Not on file   ??? Number of children: Not on file   ??? Years of education: Not on file   ??? Highest education level: Not  on file   Tobacco Use   ??? Smoking status: Former Smoker     Packs/day: 1.00     Last attempt to quit: 01/28/2018     Years since quitting: 0.1   ??? Smokeless tobacco: Never Used   Substance and Sexual Activity   ??? Alcohol use: No   ??? Drug use: No   ??? Sexual activity: Yes     Partners: Male       Family History   Problem Relation Age of Onset   ??? Heart Disease Mother    ??? Other Father         AAA         Physical Exam:  Visit Vitals  BP 140/80 (BP 1 Location: Left arm, BP Patient Position: At rest)   Pulse 86   Temp 97.8 ??F (36.6 ??C) (Oral)   Resp 18   Ht 5\' 7"  (1.702 m)   Wt 165 lb (74.8 kg)   SpO2 96%   BMI 25.84 kg/m??     Physical Exam   Constitutional: She is oriented to person, place, and time and well-developed, well-nourished, and in no distress.   HENT:   Head: Normocephalic and atraumatic.   Nose: Mucosal edema present. Right sinus exhibits maxillary sinus tenderness. Right sinus exhibits no frontal sinus tenderness. Left sinus exhibits maxillary sinus tenderness. Left sinus exhibits no frontal sinus  tenderness.   Mouth/Throat: Oropharynx is clear and moist.   Bilateral cerumen impaction.   Eyes: Conjunctivae are normal.   Neck: Normal range of motion. Neck supple.   Cardiovascular: Normal rate and regular rhythm.   Pulmonary/Chest: Effort normal.   Faint bibasilar rhonchi   Abdominal: Soft. She exhibits no distension and no mass. There is no tenderness. There is no rebound and no guarding.   Musculoskeletal: Normal range of motion.   Lymphadenopathy:     She has no cervical adenopathy.   Neurological: She is alert and oriented to person, place, and time. Gait normal.   Skin: Skin is warm and dry.   Psychiatric: Mood and affect normal.   Nursing note and vitals reviewed.      Assessment/Plan:    ICD-10-CM ICD-9-CM    1. Acute non-recurrent sinusitis, unspecified location J01.90 461.9 amoxicillin (AMOXIL) 875 mg tablet   2. Acute otitis externa of both ears, unspecified type H60.503 380.10 neomycin-polymyxin-hydrocortisone, buffered, (PEDIOTIC) 3.5-10,000-1 mg/mL-unit/mL-% otic suspension      REMOVAL IMPACTED CERUMEN IRRIGATION/LVG UNILAT     Pt tolerated irrigation w/o difficulty. Ears clear, hearing intact to finger rub bilat, TMs unremarkable with only clear fluid. Canals are irritated. There is some minor swelling and pain with pinna manipulation. Pt would really like to try some drops.  Sinusitis may be viral. Pt will proceed with oral abx if no improvement on conservative tx.  Encourage rest & fluids. Humidifier, warm compresses, nasal spray, decongestants.  Discussed otc medications for symptomatic relief.  RTO/seek medical attn if sxs persist/worsen or develops any additional sxs/concerns.    Pt verbalizes understanding and agrees with the plan.    Marlan PalauSarah S. Jenkins, PA-C

## 2018-04-08 NOTE — Progress Notes (Signed)
Visit Vitals  BP 140/80 (BP 1 Location: Left arm, BP Patient Position: At rest)   Pulse 86   Temp 97.8 ??F (36.6 ??C) (Oral)   Resp 18   Ht 5' 7" (1.702 m)   Wt 165 lb (74.8 kg)   SpO2 96%   BMI 25.84 kg/m??     Chief Complaint   Patient presents with   ??? Ear Pain   ??? Cough   ??? Hoarse     1. Have you been to the ER, urgent care clinic since your last visit?  Hospitalized since your last visit?No    2. Have you seen or consulted any other health care providers outside of the [facility] since your last visit?  Include any pap smears or colon screening. No

## 2018-04-08 NOTE — Patient Instructions (Signed)
Sinusitis: Care Instructions  Your Care Instructions    Sinusitis is an infection of the lining of the sinus cavities in your head. Sinusitis often follows a cold. It causes pain and pressure in your head and face.  In most cases, sinusitis gets better on its own in 1 to 2 weeks. But some mild symptoms may last for several weeks. Sometimes antibiotics are needed.  Follow-up care is a key part of your treatment and safety. Be sure to make and go to all appointments, and call your doctor if you are having problems. It's also a good idea to know your test results and keep a list of the medicines you take.  How can you care for yourself at home?  ?? Take an over-the-counter pain medicine, such as acetaminophen (Tylenol), ibuprofen (Advil, Motrin), or naproxen (Aleve). Read and follow all instructions on the label.  ?? If the doctor prescribed antibiotics, take them as directed. Do not stop taking them just because you feel better. You need to take the full course of antibiotics.  ?? Be careful when taking over-the-counter cold or flu medicines and Tylenol at the same time. Many of these medicines have acetaminophen, which is Tylenol. Read the labels to make sure that you are not taking more than the recommended dose. Too much acetaminophen (Tylenol) can be harmful.  ?? Breathe warm, moist air from a steamy shower, a hot bath, or a sink filled with hot water. Avoid cold, dry air. Using a humidifier in your home may help. Follow the directions for cleaning the machine.  ?? Use saline (saltwater) nasal washes to help keep your nasal passages open and wash out mucus and bacteria. You can buy saline nose drops at a grocery store or drugstore. Or you can make your own at home by adding 1 teaspoon of salt and 1 teaspoon of baking soda to 2 cups of distilled water. If you make your own, fill a bulb syringe with the solution, insert the tip into your nostril, and squeeze gently. Blow your nose.   ?? Put a hot, wet towel or a warm gel pack on your face 3 or 4 times a day for 5 to 10 minutes each time.  ?? Try a decongestant nasal spray like oxymetazoline (Afrin). Do not use it for more than 3 days in a row. Using it for more than 3 days can make your congestion worse.  When should you call for help?  Call your doctor now or seek immediate medical care if:  ?? ?? You have new or worse swelling or redness in your face or around your eyes.   ?? ?? You have a new or higher fever.   ??Watch closely for changes in your health, and be sure to contact your doctor if:  ?? ?? You have new or worse facial pain.   ?? ?? The mucus from your nose becomes thicker (like pus) or has new blood in it.   ?? ?? You are not getting better as expected.   Where can you learn more?  Go to http://www.healthwise.net/GoodHelpConnections.  Enter I933 in the search box to learn more about "Sinusitis: Care Instructions."  Current as of: June 23, 2017  Content Version: 12.1  ?? 2006-2019 [employer]. Care instructions adapted under license by [employer] (which disclaims liability or warranty for this information). If you have questions about a medical condition or this instruction, always ask your healthcare professional. Healthwise, Inc[employer] disclaims any warranty or liability for your   use of this information.         Swimmer's Ear: Care Instructions  Your Care Instructions    Swimmer's ear (otitis externa) is inflammation or infection of the ear canal. This is the passage that leads from the outer ear to the eardrum. Any water, sand, or other debris that gets into the ear canal and stays there can cause swimmer's ear. Putting cotton swabs or other items in the ear to clean it can also cause this problem.  Swimmer's ear can be very painful. But you can treat the pain and infection with medicines. You should feel better in a few days.  Follow-up care is a key part of your treatment and safety. Be sure to make  and go to all appointments, and call your doctor if you are having problems. It's also a good idea to know your test results and keep a list of the medicines you take.  How can you care for yourself at home?  Cleaning and care  ?? Use antibiotic drops as your doctor directs.  ?? Do not insert ear drops (other than the antibiotic ear drops) or anything else into the ear unless your doctor has told you to.  ?? Avoid getting water in the ear until the problem clears up. Use cotton lightly coated with petroleum jelly as an earplug. Do not use plastic earplugs.  ?? Use a hair dryer set on low to carefully dry the ear after you shower.  ?? To ease ear pain, hold a warm washcloth against your ear.  ?? Take pain medicines exactly as directed.  ? If the doctor gave you a prescription medicine for pain, take it as prescribed.  ? If you are not taking a prescription pain medicine, ask your doctor if you can take an over-the-counter medicine.  Inserting ear drops  ?? Warm the drops to body temperature by rolling the container in your hands. Or you can place it in a cup of warm water for a few minutes.  ?? Lie down, with your ear facing up.  ?? Place drops inside the ear. Follow your doctor's instructions (or the directions on the label) for how many drops to use. Gently wiggle the outer ear or pull the ear up and back to help the drops get into the ear.  ?? It's important to keep the liquid in the ear canal for 3 to 5 minutes.  When should you call for help?  Call your doctor now or seek immediate medical care if:  ?? ?? You have a new or higher fever.   ?? ?? You have new or worse pain, swelling, warmth, or redness around or behind your ear.   ?? ?? You have new or increasing pus or blood draining from your ear.   ??Watch closely for changes in your health, and be sure to contact your doctor if:  ?? ?? You are not getting better after 2 days (48 hours).   Where can you learn more?  Go to [employer]Stats[employer]cahttp://www.healthwise.net/GoodHelpConnections.   Enter C706 in the search box to learn more about "Swimmer's Ear: Care Instructions."  Current as of: June 23, 2017  Content Version: 12.1  ?? 2006-2019 [employer]. Care instructions adapted under license by [employer] (which disclaims liability or warranty for this information). If you have questions about a medical condition or this instruction, always ask your healthcare professional. Healthwise, Incorporated disclaims any warranty or liability for your use of this information.

## 2018-04-29 ENCOUNTER — Encounter: Attending: Physician Assistant | Primary: Physician Assistant

## 2018-05-14 ENCOUNTER — Ambulatory Visit: Attending: Physician Assistant | Primary: Physician Assistant

## 2018-05-14 ENCOUNTER — Ambulatory Visit: Admit: 2018-05-14 | Discharge: 2018-05-14 | Attending: Physician Assistant | Primary: Physician Assistant

## 2018-05-14 DIAGNOSIS — E1121 Type 2 diabetes mellitus with diabetic nephropathy: Secondary | ICD-10-CM

## 2018-05-14 LAB — AMB POC HEMOGLOBIN A1C
Hemoglobin A1C, POC: 7.1 %
Hemoglobin A1c (POC): 7.1 %

## 2018-05-14 NOTE — Progress Notes (Signed)
1. Have you been to the ER, urgent care clinic since your last visit?  Hospitalized since your last visit?No    2. Have you seen or consulted any other health care providers outside of the [facility] since your last visit?  Include any pap smears or colon screening. Yes When: saw her gyn for uti f/u

## 2018-05-14 NOTE — Progress Notes (Signed)
Madison Peters is a 67 y.o. female who presents to the office today with the following:  Chief Complaint   Patient presents with   ??? Hypotension     hold medication,4 week f/u       HPI  Here for f/u after holding her BP medication due to symptomatic hypotension.  Pt has since been doing well.  Pt notes she has been checking three times daily and still has remained wnl off the medication.  Just bought new cuff. Has not brought this in for comparison, but reports readings in 120s/60-70s range.  No cardiac or other complaints.    Also due for A1c.  Not checking BS.  Admits doing poorly with diet, but is taking her Metformin appropriately this time (previously was not), is on max dosage. Feels she is eating more since she quit smoking.  Lab Results   Component Value Date/Time    Hemoglobin A1c 9.5 (H) 01/20/2018 10:13 AM    Hemoglobin A1c (POC) 7.1 05/14/2018 08:58 AM     Needs to schedule her mammogram, goes through [facility].  Waiting to get colonoscopy after her pelvic sxs resolve, still being followed by GYN. Plans to have in [place].  Never went for DEXA last year.     Also has thick yellow nails, but no other foot complaints.   Denies any n/t/w/s.   Has difficulty cutting her nails and would like to see a podiatrist.  Did not like the one she saw in [place].    Otherwise feeling well with no other complaints or acute concerns.      Review of Systems   Constitutional: Negative.  Negative for chills and fever.   Respiratory: Negative.  Negative for cough and shortness of breath.    Cardiovascular: Negative.  Negative for chest pain.   Gastrointestinal: Negative.    Neurological: Negative.      See HPI.    Past Medical History:   Diagnosis Date   ??? Anxiety    ??? Diabetes (HCC)    ??? Dyspepsia    ??? Emphysema    ??? Headache(784.0)    ??? Hypertension    ??? IGT (impaired glucose tolerance)    ??? Kidney stones    ??? Lipoma     Left calf   ??? Thyroid disease     hypothryoidism       Past Surgical History:   Procedure  Laterality Date   ??? HX HEENT      Tonsillectomy at age 39       Allergies   Allergen Reactions   ??? Ace Inhibitors Cough   ??? Amoxicillin Nausea and Vomiting   ??? Erythromycin Other (comments)     Does not remember.   ??? Sulfa (Sulfonamide Antibiotics) Nausea and Vomiting       Current Outpatient Medications   Medication Sig   ??? fluconazole (DIFLUCAN) 150 mg tablet Diflucan 150 mg tablet   Take 1 tablet by oral route   ??? aspirin delayed-release 81 mg tablet Take  by mouth daily.   ??? esomeprazole (NEXIUM) 20 mg capsule Take  by mouth daily.   ??? OTHER Taking a pro biotic Leanne Chang V daily   ??? multivitamin (ONE A DAY) tablet Take 1 Tab by mouth daily.   ??? atorvastatin (LIPITOR) 80 mg tablet TAKE 1 TABLET BY MOUTH EVERY DAY   ??? FLUoxetine (PROZAC) 20 mg capsule TAKE 1 CAPSULE BY MOUTH TWICE A DAY   ??? levothyroxine (SYNTHROID) 50 mcg tablet TAKE  1 TABLET BY MOUTH EVERY DAY   ??? metFORMIN (GLUCOPHAGE) 1,000 mg tablet TAKE 1&1/2 TABLETS EVERY MORNING AND 1 IN THE EVENING WITH A MEAL   ??? albuterol (PROVENTIL HFA, VENTOLIN HFA, PROAIR HFA) 90 mcg/actuation inhaler Take 1 Puff by inhalation every six (6) hours as needed for Wheezing. Indications: bronchospasm prevention   ??? acetaminophen (TYLENOL) 500 mg tablet Take  by mouth every six (6) hours as needed for Pain.   ??? omega-3 fatty acids-vitamin e (FISH OIL) 1,000 mg cap Take 1 Cap by mouth four (4) times daily.     No current facility-administered medications for this visit.        Social History     Socioeconomic History   ??? Marital status: MARRIED     Spouse name: Not on file   ??? Number of children: Not on file   ??? Years of education: Not on file   ??? Highest education level: Not on file   Tobacco Use   ??? Smoking status: Former Smoker     Packs/day: 1.00     Last attempt to quit: 01/28/2018     Years since quitting: 0.2   ??? Smokeless tobacco: Never Used   Substance and Sexual Activity   ??? Alcohol use: No   ??? Drug use: No   ??? Sexual activity: Yes     Partners: Male       Family  History   Problem Relation Age of Onset   ??? Heart Disease Mother    ??? Other Father         AAA         Physical Exam:  Visit Vitals  BP 147/75 (BP 1 Location: Left arm, BP Patient Position: Sitting)   Pulse 78   Temp 98.5 ??F (36.9 ??C) (Oral)   Resp 16   Ht 5\' 7"  (1.702 m)   Wt 174 lb 6.4 oz (79.1 kg)   SpO2 96%   BMI 27.31 kg/m??     Physical Exam   Constitutional: She is oriented to person, place, and time and well-developed, well-nourished, and in no distress.   HENT:   Head: Normocephalic and atraumatic.   Eyes: Conjunctivae are normal.   Neck: Neck supple.   Cardiovascular: Normal rate, regular rhythm, normal heart sounds and intact distal pulses.   Pulmonary/Chest: Effort normal and breath sounds normal.   Musculoskeletal: She exhibits no edema.   Neurological: She is alert and oriented to person, place, and time. Gait normal.   Skin: Skin is warm and dry.   Psychiatric: Mood and affect normal.   Nursing note and vitals reviewed.      Assessment/Plan:    ICD-10-CM ICD-9-CM    1. 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 Essential hypertension I10 401.9    3. Hypotension due to drugs I95.2 458.8      E947.9    4. Encounter for immunization Z23 V03.89 INFLUENZA VIRUS VACCINE, HIGH DOSE SEASONAL, PRESERVATIVE FREE   5. Onychomycosis B35.1 110.1 REFERRAL TO PODIATRY   6. Postmenopausal Z78.0 V49.81 DEXA BONE DENSITY STUDY AXIAL     Pt to return for f/u BP check and cuff comparison in 1-2 weeks nurse visit.  She should make a f/u apt if remains elevated, otherwise return in 3 mo for fasting labs.  Continue to work on LMs. Praised healthy choices so far.  Follow-up and Dispositions    ?? Return in about 3 months (around 08/13/2018), or sooner if symptoms worsen or fail to improve.       Seek care in interim for any new sxs or other concerns.  Pt verbalizes understanding and agrees  with the plan.    Marlan Palau, PA-C

## 2018-05-14 NOTE — Telephone Encounter (Signed)
Patient returning your call for referral.  She can be reached at [phone]

## 2018-05-14 NOTE — Progress Notes (Signed)
Madison Peters is a 67 y.o. female who presents to the office today with the following:  Chief Complaint   Patient presents with   ??? Hypotension     hold medication,4 week f/u       HPI  Here for f/u after holding her BP medication due to symptomatic hypotension.  Pt has since been doing well.  Pt notes she has been checking three times daily and still has remained wnl off the medication.  Just bought new cuff. Has not brought this in for comparison, but reports readings in 120s/60-70s range.  No cardiac or other complaints.    Also due for A1c.  Not checking BS.  Admits doing poorly with diet, but is taking her Metformin appropriately this time (previously was not), is on max dosage. Feels she is eating more since she quit smoking.  Lab Results   Component Value Date/Time    Hemoglobin A1c 9.5 (H) 01/20/2018 10:13 AM    Hemoglobin A1c (POC) 7.1 05/14/2018 08:58 AM     Needs to schedule her mammogram, goes through [facility].  Waiting to get colonoscopy after her pelvic sxs resolve, still being followed by GYN. Plans to have in [place].  Never went for DEXA last year.     Also has thick yellow nails, but no other foot complaints.   Denies any n/t/w/s.   Has difficulty cutting her nails and would like to see a podiatrist.  Did not like the one she saw in [place].    Otherwise feeling well with no other complaints or acute concerns.      Review of Systems   Constitutional: Negative.  Negative for chills and fever.   Respiratory: Negative.  Negative for cough and shortness of breath.    Cardiovascular: Negative.  Negative for chest pain.   Gastrointestinal: Negative.    Neurological: Negative.      See HPI.    Past Medical History:   Diagnosis Date   ??? Anxiety    ??? Diabetes (HCC)    ??? Dyspepsia    ??? Emphysema    ??? Headache(784.0)    ??? Hypertension    ??? IGT (impaired glucose tolerance)    ??? Kidney stones    ??? Lipoma     Left calf   ??? Thyroid disease     hypothryoidism       Past Surgical History:    Procedure Laterality Date   ??? HX HEENT      Tonsillectomy at age 82       Allergies   Allergen Reactions   ??? Ace Inhibitors Cough   ??? Amoxicillin Nausea and Vomiting   ??? Erythromycin Other (comments)     Does not remember.   ??? Sulfa (Sulfonamide Antibiotics) Nausea and Vomiting       Current Outpatient Medications   Medication Sig   ??? fluconazole (DIFLUCAN) 150 mg tablet Diflucan 150 mg tablet   Take 1 tablet by oral route   ??? aspirin delayed-release 81 mg tablet Take  by mouth daily.   ??? esomeprazole (NEXIUM) 20 mg capsule Take  by mouth daily.   ??? OTHER Taking a pro biotic Leanne Chang V daily   ??? multivitamin (ONE A DAY) tablet Take 1 Tab by mouth daily.   ??? atorvastatin (LIPITOR) 80 mg tablet TAKE 1 TABLET BY MOUTH EVERY DAY   ??? FLUoxetine (PROZAC) 20 mg capsule TAKE 1 CAPSULE BY MOUTH TWICE A DAY   ??? levothyroxine (SYNTHROID) 50 mcg tablet TAKE  1 TABLET BY MOUTH EVERY DAY   ??? metFORMIN (GLUCOPHAGE) 1,000 mg tablet TAKE 1&1/2 TABLETS EVERY MORNING AND 1 IN THE EVENING WITH A MEAL   ??? albuterol (PROVENTIL HFA, VENTOLIN HFA, PROAIR HFA) 90 mcg/actuation inhaler Take 1 Puff by inhalation every six (6) hours as needed for Wheezing. Indications: bronchospasm prevention   ??? acetaminophen (TYLENOL) 500 mg tablet Take  by mouth every six (6) hours as needed for Pain.   ??? omega-3 fatty acids-vitamin e (FISH OIL) 1,000 mg cap Take 1 Cap by mouth four (4) times daily.     No current facility-administered medications for this visit.        Social History     Socioeconomic History   ??? Marital status: MARRIED     Spouse name: Not on file   ??? Number of children: Not on file   ??? Years of education: Not on file   ??? Highest education level: Not on file   Tobacco Use   ??? Smoking status: Former Smoker     Packs/day: 1.00     Last attempt to quit: 01/28/2018     Years since quitting: 0.2   ??? Smokeless tobacco: Never Used   Substance and Sexual Activity   ??? Alcohol use: No   ??? Drug use: No   ??? Sexual activity: Yes     Partners: Male        Family History   Problem Relation Age of Onset   ??? Heart Disease Mother    ??? Other Father         AAA         Physical Exam:  Visit Vitals  BP 147/75 (BP 1 Location: Left arm, BP Patient Position: Sitting)   Pulse 78   Temp 98.5 ??F (36.9 ??C) (Oral)   Resp 16   Ht 5\' 7"  (1.702 m)   Wt 174 lb 6.4 oz (79.1 kg)   SpO2 96%   BMI 27.31 kg/m??     Physical Exam   Constitutional: She is oriented to person, place, and time and well-developed, well-nourished, and in no distress.   HENT:   Head: Normocephalic and atraumatic.   Eyes: Conjunctivae are normal.   Neck: Neck supple.   Cardiovascular: Normal rate, regular rhythm, normal heart sounds and intact distal pulses.   Pulmonary/Chest: Effort normal and breath sounds normal.   Musculoskeletal: She exhibits no edema.   Neurological: She is alert and oriented to person, place, and time. Gait normal.   Skin: Skin is warm and dry.   Psychiatric: Mood and affect normal.   Nursing note and vitals reviewed.      Assessment/Plan:    ICD-10-CM ICD-9-CM    1. 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 Essential hypertension I10 401.9    3. Hypotension due to drugs I95.2 458.8      E947.9    4. Encounter for immunization Z23 V03.89 INFLUENZA VIRUS VACCINE, HIGH DOSE SEASONAL, PRESERVATIVE FREE   5. Onychomycosis B35.1 110.1 REFERRAL TO PODIATRY   6. Postmenopausal Z78.0 V49.81 DEXA BONE DENSITY STUDY AXIAL     Pt to return for f/u BP check and cuff comparison in 1-2 weeks nurse visit.  She should make a f/u apt if remains elevated, otherwise return in 3 mo for fasting labs.  Continue to work on LMs. Praised healthy choices so far.  Follow-up and Dispositions    ?? Return in about 3 months (around 08/13/2018), or sooner if symptoms worsen or fail to improve.       Seek care in interim for any new sxs or other concerns.   Pt verbalizes understanding and agrees with the plan.    Marlan Palau, PA-C

## 2018-05-14 NOTE — Patient Instructions (Signed)
Nutrition Tips for Diabetes: After Your Visit  Your Care Instructions  A healthy diet is important to manage diabetes. It helps you lose weight (if you need to) and keep it off. It gives you the nutrition and energy your body needs and helps prevent heart disease. But a diet for diabetes does not mean that you have to eat special foods. You can eat what your family eats, including occasional sweets and other favorites. But you do have to pay attention to how often you eat and how much you eat of certain foods. The right plan for you will give you meals that help you keep your blood sugar at healthy levels.  Try to eat a variety of foods and to spread carbohydrate throughout the day. Carbohydrate raises blood sugar higher and more quickly than any other nutrient does. Carbohydrate is found in sugar, breads and cereals, fruit, starchy vegetables such as potatoes and corn, and milk and yogurt.  You may want to work with a dietitian or diabetes educator to help you plan meals and snacks. A dietitian or diabetes educator also can help you lose weight if that is one of your goals. The following tips can help you enjoy your meals and stay healthy.  Follow-up care is a key part of your treatment and safety. Be sure to make and go to all appointments, and call your doctor if you are having problems. It???s also a good idea to know your test results and keep a list of the medicines you take.  How can you care for yourself at home?  ?? Learn which foods have carbohydrate and how much carbohydrate to eat. A dietitian or diabetes educator can help you learn to keep track of how much carbohydrate you eat.  ?? Spread carbohydrate throughout the day. Eat some carbohydrate at all meals, but do not eat too much at any one time.  ?? Plan meals to include food from all the food groups. These are the food groups and some example portion sizes:  ?? Grains: 1 slice of bread (1 ounce), ?? cup of cooked cereal, and 1/3 cup  of cooked pasta or rice. These have about 15 grams of carbohydrate in a serving. Choose whole grains such as whole wheat bread or crackers, oatmeal, and brown rice more often than refined grains.  ?? Fruit: 1 small fresh fruit, such as an apple or orange; ?? of a banana; ?? cup of chopped, cooked, or canned fruit; ?? cup of fruit juice; 1 cup of melon or raspberries; and 2 tablespoons of dried fruit. These have about 15 grams of carbohydrate in a serving.  ?? Dairy: 1 cup of nonfat or low-fat milk and 2/3 cup of plain yogurt. These have about 15 grams of carbohydrate in a serving.  ?? Protein foods: Beef, chicken, turkey, fish, eggs, tofu, cheese, cottage cheese, and peanut butter. A serving size of meat is 3 ounces, which is about the size of a deck of cards. Examples of meat substitute serving sizes (equal to 1 ounce of meat) are 1/4 cup of cottage cheese, 1 egg, 1 tablespoon of peanut butter, and ?? cup of tofu. These have very little or no carbohydrate per serving.  ?? Vegetables: Starchy vegetables such as ?? cup of cooked dried beans, peas, potatoes, or corn have about 15 grams of carbohydrate. Nonstarchy vegetables have very little carbohydrate, such as 1 cup of raw leafy vegetables (such as spinach), ?? cup of other vegetables (cooked or chopped), and 3/4 cup   of vegetable juice.  ?? Use the plate format to plan meals. It is a good, quick way to make sure that you have a balanced meal. It also helps you spread carbohydrate throughout the day. You divide your plate by types of foods. Put vegetables on half the plate, meat or meat substitutes on one-quarter of the plate, and a grain or starchy vegetable (such as brown rice or a potato) in the final quarter of the plate. To this you can add a small piece of fruit and 1 cup of milk or yogurt, depending on how much carbohydrate you are supposed to eat at a meal.  ?? Talk to your dietitian or diabetes educator about ways to add limited  amounts of sweets into your meal plan. You can eat these foods now and then, as long as you include the amount of carbohydrate they have in your daily carbohydrate allowance.  ?? If you drink alcohol, limit it to no more than 1 drink a day for women and 2 drinks a day for men. If you are pregnant, no amount of alcohol is known to be safe.  ?? Protein, fat, and fiber do not raise blood sugar as much as carbohydrate does. If you eat a lot of these nutrients in a meal, your blood sugar will rise more slowly than it would otherwise.  ?? Limit saturated fats, such as those from meat and dairy products. Try to replace it with monounsaturated fat, such as olive oil. This is a healthier choice because people who have diabetes are at higher-than-average risk of heart disease. But use a modest amount of olive oil. A tablespoon of olive oil has 14 grams of fat and 120 calories.  ?? Exercise lowers blood sugar. If you take insulin by shots or pump, you can use less than you would if you were not exercising. Keep in mind that timing matters. If you exercise within 1 hour after a meal, your body may need less insulin for that meal than it would if you exercised 3 hours after the meal. Test your blood sugar to find out how exercise affects your need for insulin.  ?? Exercise on most days of the week. Aim for at least 30 minutes. Exercise helps you stay at a healthy weight and helps your body use insulin. Walking is an easy way to get exercise. Gradually increase the amount you walk every day. You also may want to swim, bike, or do other activities.  When you eat out  ?? Learn to estimate the serving sizes of foods that have carbohydrate. If you measure food at home, it will be easier to estimate the amount in a serving of restaurant food.  ?? If the meal you order has too much carbohydrate (such as potatoes, corn, or baked beans), ask to have a low-carbohydrate food instead. Ask for a salad or green vegetables.   ?? If you use insulin, check your blood sugar before and after eating out to help you plan how much to eat in the future.  ?? If you eat more carbohydrate at a meal than you had planned, take a walk or do other exercise. This will help lower your blood sugar.   Where can you learn more?   Go to http://www.healthwise.net/[facility]  Enter I183 in the search box to learn more about "Nutrition Tips for Diabetes: After Your Visit."   ?? 2006-2014 Healthwise, Incorporated. Care instructions adapted under license by [facility] (which disclaims liability or warranty for this   information). This care instruction is for use with your licensed healthcare professional. If you have questions about a medical condition or this instruction, always ask your healthcare professional. [place] any warranty or liability for your use of this information.  Content Version: 10.2.346038; Current as of: February 04, 2013                 Learning About Meal Planning for Diabetes  Why plan your meals?    Meal planning can be a key part of managing diabetes. Planning meals and snacks with the right balance of carbohydrate, protein, and fat can help you keep your blood sugar at the target level you set with your doctor.  You don't have to eat special foods. You can eat what your family eats, including sweets once in a while. But you do have to pay attention to how often you eat and how much you eat of certain foods.  You may want to work with a dietitian or a certified diabetes educator. He or she can give you tips and meal ideas and can answer your questions about meal planning. This health professional can also help you reach a healthy weight if that is one of your goals.  What plan is right for you?  Your dietitian or diabetes educator may suggest that you start with the plate format or carbohydrate counting.  The plate format  The plate format is a simple way to help you manage how you eat. You plan  meals by learning how much space each food should take on a plate. Using the plate format helps you spread carbohydrate throughout the day. It can make it easier to keep your blood sugar level within your target range. It also helps you see if you're eating healthy portion sizes.  To use the plate format, you put non-starchy vegetables on half your plate. Add meat or meat substitutes on one-quarter of the plate. Put a grain or starchy vegetable (such as brown rice or a potato) on the final quarter of the plate. You can add a small piece of fruit and some low-fat or fat-free milk or yogurt, depending on your carbohydrate goal for each meal.  Here are some tips for using the plate format:  ?? Make sure that you are not using an oversized plate. A 9-inch plate is best. Many restaurants use larger plates.  ?? Get used to using the plate format at home. Then you can use it when you eat out.  ?? Write down your questions about using the plate format. Talk to your doctor, a dietitian, or a diabetes educator about your concerns.  Carbohydrate counting  With carbohydrate counting, you plan meals based on the amount of carbohydrate in each food. Carbohydrate raises blood sugar higher and more quickly than any other nutrient. It is found in desserts, breads and cereals, and fruit. It's also found in starchy vegetables such as potatoes and corn, grains such as rice and pasta, and milk and yogurt. Spreading carbohydrate throughout the day helps keep your blood sugar levels within your target range.  Your daily amount depends on several things, including your weight, how active you are, which diabetes medicines you take, and what your goals are for your blood sugar levels. A registered dietitian or diabetes educator can help you plan how much carbohydrate to include in each meal and snack.  A guideline for your daily amount of carbohydrate is:  ?? 45 to 60 grams at each meal. That's about  the same as 3 to 4 carbohydrate servings.   ?? 15 to 20 grams at each snack. That's about the same as 1 carbohydrate serving.  The Nutrition Facts label on packaged foods tells you how much carbohydrate is in a serving of the food. First, look at the serving size on the food label. Is that the amount you eat in a serving? All of the nutrition information on a food label is based on that serving size. So if you eat more or less than that, you'll need to adjust the other numbers. Total carbohydrate is the next thing you need to look for on the label. If you count carbohydrate servings, one serving of carbohydrate is 15 grams.  For foods that don't come with labels, such as fresh fruits and vegetables, you'll need a guide that lists carbohydrate in these foods. Ask your doctor, dietitian, or diabetes educator about books or other nutrition guides you can use.  If you take insulin, you need to know how many grams of carbohydrate are in a meal. This lets you know how much rapid-acting insulin to take before you eat. If you use an insulin pump, you get a constant rate of insulin during the day. So the pump must be programmed at meals to give you extra insulin to cover the rise in blood sugar after meals.  When you know how much carbohydrate you will eat, you can take the right amount of insulin. Or, if you always use the same amount of insulin, you need to make sure that you eat the same amount of carbohydrate at meals.  If you need more help to understand carbohydrate counting and food labels, ask your doctor, dietitian, or diabetes educator.  How do you get started with meal planning?  Here are some tips to get started:  ?? Plan your meals a week at a time. Don't forget to include snacks too.  ?? Use cookbooks or online recipes to plan several main meals. Plan some quick meals for busy nights. You also can double some recipes that freeze well. Then you can save half for other busy nights when you don't have time to cook.   ?? Make sure you have the ingredients you need for your recipes. If you're running low on basic items, put these items on your shopping list too.  ?? List foods that you use to make breakfasts, lunches, and snacks. List plenty of fruits and vegetables.  ?? Post this list on the refrigerator. Add to it as you think of more things you need.  ?? Take the list to the store to do your weekly shopping.  Follow-up care is a key part of your treatment and safety. Be sure to make and go to all appointments, and call your doctor if you are having problems. It's also a good idea to know your test results and keep a list of the medicines you take.  Where can you learn more?  Go to [employer].  Enter [phone] in the search box to learn more about "Learning About Meal Planning for Diabetes."  Current as of: March 27, 2017  Content Version: 12.1  ?? 2006-2019 Healthwise, Incorporated. Care instructions adapted under license by [employer] [facility] [employer] (which disclaims liability or warranty for this information). If you have questions about a medical condition or this instruction, always ask your healthcare professional. Healthwise, Inc[employer] disclaims any warranty or liability for your use of this information.         Learning About Diabetes  Food Guidelines  Your Care Instructions    Meal planning is important to manage diabetes. It helps keep your blood sugar at a target level (which you set with your doctor). You don't have to eat special foods. You can eat what your family eats, including sweets once in a while. But you do have to pay attention to how often you eat and how much you eat of certain foods.  You may want to work with a dietitian or a certified diabetes educator (CDE) to help you plan meals and snacks. A dietitian or CDE can also help you lose weight if that is one of your goals.  What should you know about eating carbs?   Managing the amount of carbohydrate (carbs) you eat is an important part of healthy meals when you have diabetes. Carbohydrate is found in many foods.  ?? Learn which foods have carbs. And learn the amounts of carbs in different foods.  ? Bread, cereal, pasta, and rice have about 15 grams of carbs in a serving. A serving is 1 slice of bread (1 ounce), ?? cup of cooked cereal, or 1/3 cup of cooked pasta or rice.  ? Fruits have 15 grams of carbs in a serving. A serving is 1 small fresh fruit, such as an apple or orange; ?? of a banana; ?? cup of cooked or canned fruit; ?? cup of fruit juice; 1 cup of melon or raspberries; or 2 tablespoons of dried fruit.  ? Milk and no-sugar-added yogurt have 15 grams of carbs in a serving. A serving is 1 cup of milk or 2/3 cup of no-sugar-added yogurt.  ? Starchy vegetables have 15 grams of carbs in a serving. A serving is ?? cup of mashed potatoes or sweet potato; 1 cup winter squash; ?? of a small baked potato; ?? cup of cooked beans; or ?? cup cooked corn or green peas.  ?? Learn how much carbs to eat each day and at each meal. A dietitian or CDE can teach you how to keep track of the amount of carbs you eat. This is called carbohydrate counting.  ?? If you are not sure how to count carbohydrate grams, use the Plate Method to plan meals. It is a good, quick way to make sure that you have a balanced meal. It also helps you spread carbs throughout the day.  ? Divide your plate by types of foods. Put non-starchy vegetables on half the plate, meat or other protein food on one-quarter of the plate, and a grain or starchy vegetable in the final quarter of the plate. To this you can add a small piece of fruit and 1 cup of milk or yogurt, depending on how many carbs you are supposed to eat at a meal.  ?? Try to eat about the same amount of carbs at each meal. Do not "save up" your daily allowance of carbs to eat at one meal.  ?? Proteins have very little or no carbs per serving. Examples of proteins  are beef, chicken, [place], fish, eggs, tofu, cheese, cottage cheese, and peanut butter. A serving size of meat is 3 ounces, which is about the size of a deck of cards. Examples of meat substitute serving sizes (equal to 1 ounce of meat) are 1/4 cup of cottage cheese, 1 egg, 1 tablespoon of peanut butter, and ?? cup of tofu.  How can you eat out and still eat healthy?  ?? Learn to estimate the serving sizes of foods that have  carbohydrate. If you measure food at home, it will be easier to estimate the amount in a serving of restaurant food.  ?? If the meal you order has too much carbohydrate (such as potatoes, corn, or baked beans), ask to have a low-carbohydrate food instead. Ask for a salad or green vegetables.  ?? If you use insulin, check your blood sugar before and after eating out to help you plan how much to eat in the future.  ?? If you eat more carbohydrate at a meal than you had planned, take a walk or do other exercise. This will help lower your blood sugar.  What else should you know?  ?? Limit saturated fat, such as the fat from meat and dairy products. This is a healthy choice because people who have diabetes are at higher risk of heart disease. So choose lean cuts of meat and nonfat or low-fat dairy products. Use olive or canola oil instead of butter or shortening when cooking.  ?? Don't skip meals. Your blood sugar may drop too low if you skip meals and take insulin or certain medicines for diabetes.  ?? Check with your doctor before you drink alcohol. Alcohol can cause your blood sugar to drop too low. Alcohol can also cause a bad reaction if you take certain diabetes medicines.  Follow-up care is a key part of your treatment and safety. Be sure to make and go to all appointments, and call your doctor if you are having problems. It's also a good idea to know your test results and keep a list of the medicines you take.  Where can you learn more?  Go to [employer].   Enter [phone] in the search box to learn more about "Learning About Diabetes Food Guidelines."  Current as of: March 27, 2017  Content Version: 12.1  ?? 2006-2019 Healthwise, Inc[employer]. Care instructions adapted under license by [employer] (which disclaims liability or warranty for this information). If you have questions about a medical condition or this instruction, always ask your healthcare professional. [employer], [employer] disclaims any warranty or liability for your use of this information.

## 2018-05-14 NOTE — Progress Notes (Signed)
1. Have you been to the ER, urgent care clinic since your last visit?  Hospitalized since your last visit?No    2. Have you seen or consulted any other health care providers outside of the [facility] since your last visit?  Include any pap smears or colon screening. Yes When: saw her gyn for uti f/u

## 2018-06-04 NOTE — Telephone Encounter (Signed)
Left a detailed message on her voice mail that her mammogram is normal and to call this office if she has any questions

## 2018-08-02 ENCOUNTER — Encounter

## 2018-08-04 MED ORDER — FLUOXETINE 20 MG CAP
20 mg | ORAL_CAPSULE | ORAL | 1 refills | Status: DC
Start: 2018-08-04 — End: 2019-01-29

## 2018-08-04 MED ORDER — LEVOTHYROXINE 50 MCG TAB
50 mcg | ORAL_TABLET | ORAL | 1 refills | Status: DC
Start: 2018-08-04 — End: 2019-01-29

## 2018-08-04 MED ORDER — AMLODIPINE 10 MG TAB
10 mg | ORAL_TABLET | ORAL | 1 refills | Status: DC
Start: 2018-08-04 — End: 2018-08-08

## 2018-08-04 MED ORDER — METOPROLOL SUCCINATE SR 50 MG 24 HR TAB
50 mg | ORAL_TABLET | ORAL | 1 refills | Status: DC
Start: 2018-08-04 — End: 2018-08-08

## 2018-08-04 MED ORDER — HYDROCHLOROTHIAZIDE 25 MG TAB
25 mg | ORAL_TABLET | ORAL | 1 refills | Status: DC
Start: 2018-08-04 — End: 2018-08-08

## 2018-08-04 MED ORDER — ATORVASTATIN 80 MG TAB
80 mg | ORAL_TABLET | ORAL | 1 refills | Status: DC
Start: 2018-08-04 — End: 2019-01-29

## 2018-08-04 NOTE — Telephone Encounter (Signed)
Keep planned f/u this month

## 2018-08-08 ENCOUNTER — Ambulatory Visit: Attending: Physician Assistant | Primary: Physician Assistant

## 2018-08-08 ENCOUNTER — Ambulatory Visit: Admit: 2018-08-08 | Discharge: 2018-08-08 | Attending: Physician Assistant | Primary: Physician Assistant

## 2018-08-08 DIAGNOSIS — I10 Essential (primary) hypertension: Secondary | ICD-10-CM

## 2018-08-08 MED ORDER — HYDROCHLOROTHIAZIDE 12.5 MG TAB
12.5 mg | ORAL_TABLET | Freq: Every day | ORAL | 0 refills | Status: DC
Start: 2018-08-08 — End: 2018-09-01

## 2018-08-08 MED ORDER — LOSARTAN 25 MG TAB
25 mg | ORAL_TABLET | Freq: Every day | ORAL | 0 refills | Status: DC
Start: 2018-08-08 — End: 2018-09-01

## 2018-08-08 MED ORDER — KETOROLAC TROMETHAMINE 30 MG/ML INJECTION
30 mg/mL (1 mL) | Freq: Once | INTRAMUSCULAR | 0 refills | Status: AC
Start: 2018-08-08 — End: 2018-08-08

## 2018-08-08 NOTE — Progress Notes (Signed)
Madison Peters is a 67 y.o. female who presents to the office today with the following:  Chief Complaint   Patient presents with   ??? Headache   ??? Elevated Blood Pressure       HPI   Pt previously taken off her BP medication after electrolyte abnormalities and then orthostatic hypotension.   Noticed in the past week her BP has gotten pretty high. Has been very nervous about it.   160s/90-100 at home.   Did start smoking 4 cigarettes daily again a few weeks ago.  Drinks several cups of coffee daily.  Gained some weight as well from eating more quitting smoking for 5.5 months.  Returning next week for apt and labs.  Has had a headache on/off for past week.   Described as diffuse, achy.  Very anxious, thinks this is from the BP.   No other symptoms otherwise and feels well.  Denies any neuro deficits.     Review of Systems   Constitutional: Negative.    HENT: Negative.    Eyes: Negative.    Respiratory: Negative.  Negative for shortness of breath.    Cardiovascular: Negative.  Negative for chest pain and leg swelling.   Gastrointestinal: Negative.    Musculoskeletal: Negative for neck pain.   Neurological: Negative.      See HPI.    Past Medical History:   Diagnosis Date   ??? Anxiety    ??? Diabetes (HCC)    ??? Dyspepsia    ??? Emphysema    ??? Headache(784.0)    ??? Hypertension    ??? IGT (impaired glucose tolerance)    ??? Kidney stones    ??? Lipoma     Left calf   ??? Thyroid disease     hypothryoidism       Past Surgical History:   Procedure Laterality Date   ??? HX HEENT      Tonsillectomy at age 63       Allergies   Allergen Reactions   ??? Ace Inhibitors Cough   ??? Amoxicillin Nausea and Vomiting   ??? Erythromycin Other (comments)     Does not remember.   ??? Sulfa (Sulfonamide Antibiotics) Nausea and Vomiting       Current Outpatient Medications   Medication Sig   ??? losartan (COZAAR) 25 mg tablet Take 1 Tab by mouth daily.   ??? hydroCHLOROthiazide (HYDRODIURIL) 12.5 mg tablet Take 1 Tab by mouth daily.   ??? ketorolac (TORADOL) 30 mg/mL (1  mL) injection 1 mL by IntraVENous route once for 1 dose.   ??? atorvastatin (LIPITOR) 80 mg tablet TAKE 1 TABLET BY MOUTH EVERY DAY   ??? FLUoxetine (PROZAC) 20 mg capsule TAKE 1 CAPSULE BY MOUTH TWICE A DAY   ??? levothyroxine (SYNTHROID) 50 mcg tablet TAKE 1 TABLET BY MOUTH EVERY DAY   ??? aspirin delayed-release 81 mg tablet Take  by mouth daily.   ??? esomeprazole (NEXIUM) 20 mg capsule Take  by mouth daily.   ??? OTHER Taking a pro biotic Leanne Chang V daily   ??? multivitamin (ONE A DAY) tablet Take 1 Tab by mouth daily.   ??? metFORMIN (GLUCOPHAGE) 1,000 mg tablet TAKE 1&1/2 TABLETS EVERY MORNING AND 1 IN THE EVENING WITH A MEAL   ??? acetaminophen (TYLENOL) 500 mg tablet Take  by mouth every six (6) hours as needed for Pain.   ??? omega-3 fatty acids-vitamin e (FISH OIL) 1,000 mg cap Take 1 Cap by mouth four (4) times daily.  No current facility-administered medications for this visit.        Social History     Socioeconomic History   ??? Marital status: MARRIED     Spouse name: Not on file   ??? Number of children: Not on file   ??? Years of education: Not on file   ??? Highest education level: Not on file   Tobacco Use   ??? Smoking status: Former Smoker     Packs/day: 1.00     Last attempt to quit: 01/28/2018     Years since quitting: 0.5   ??? Smokeless tobacco: Never Used   Substance and Sexual Activity   ??? Alcohol use: No   ??? Drug use: No   ??? Sexual activity: Yes     Partners: Male       Family History   Problem Relation Age of Onset   ??? Heart Disease Mother    ??? Other Father         AAA         Physical Exam:  Visit Vitals  BP 151/81 (BP 1 Location: Left arm, BP Patient Position: At rest)   Pulse (!) 59   Temp 97.7 ??F (36.5 ??C) (Oral)   Resp 18   Ht 5\' 7"  (1.702 m)   Wt 184 lb (83.5 kg)   SpO2 97%   BMI 28.82 kg/m??     Physical Exam  Vitals signs and nursing note reviewed.   Constitutional:       Appearance: Normal appearance.   HENT:      Head: Normocephalic and atraumatic.      Right Ear: Tympanic membrane, ear canal and external ear  normal.      Left Ear: Tympanic membrane, ear canal and external ear normal.      Mouth/Throat:      Mouth: Mucous membranes are moist.      Pharynx: Oropharynx is clear.   Eyes:      Conjunctiva/sclera: Conjunctivae normal.   Neck:      Musculoskeletal: Normal range of motion and neck supple.   Cardiovascular:      Rate and Rhythm: Normal rate and regular rhythm.   Pulmonary:      Effort: Pulmonary effort is normal.      Breath sounds: Normal breath sounds.   Skin:     General: Skin is warm and dry.   Neurological:      General: No focal deficit present.      Mental Status: She is alert and oriented to person, place, and time.      Cranial Nerves: Cranial nerves are intact.      Sensory: Sensation is intact.      Motor: Motor function is intact.      Coordination: Coordination is intact.      Gait: Gait is intact.   Psychiatric:         Mood and Affect: Mood and affect normal.         Assessment/Plan:    ICD-10-CM ICD-9-CM    1. Essential hypertension I10 401.9 losartan (COZAAR) 25 mg tablet      hydroCHLOROthiazide (HYDRODIURIL) 12.5 mg tablet   2. Nonintractable episodic headache, unspecified headache type R51 784.0 ketorolac (TORADOL) 30 mg/mL (1 mL) injection      KETOROLAC TROMETHAMINE INJ      PR THER/PROPH/DIAG INJECTION, SUBCUT/IM       tolerated injection w/o difficulty.  Med list updated, called to c/w pharmacy (pt was not taking any  BP meds and just had three prior ones sent but did not pick up or take any yet).  Counseled on alternating otc medications until HA resolves. Limit nsaid use.  Monitor BP. Encouraged LMs as well, will work on smoking cessation and cut back on caffeine.   Will return as planned for f/u next week.  Seek care in interim for any new sxs or other concerns.  To ER for any severe/worsening HA or other "red flag" sxs.  Pt verbalizes understanding and agrees with the plan.    Marlan PalauSarah S. Jenkins, PA-C

## 2018-08-08 NOTE — Progress Notes (Signed)
Chief Complaint   Patient presents with   . Headache   . Elevated Blood Pressure     Visit Vitals  BP 151/81 (BP 1 Location: Left arm, BP Patient Position: At rest)   Pulse (!) 59   Temp 97.7 F (36.5 C) (Oral)   Resp 18   Ht 5\' 7"  (1.702 m)   Wt 184 lb (83.5 kg)   SpO2 97%   BMI 28.82 kg/m     1. Have you been to the ER, urgent care clinic since your last visit?  Hospitalized since your last visit?No    2. Have you seen or consulted any other health care providers outside of the [facility] since your last visit?  Include any pap smears or colon screening. No

## 2018-08-08 NOTE — Progress Notes (Signed)
Chief Complaint   Patient presents with   ??? Headache   ??? Elevated Blood Pressure     Visit Vitals  BP 151/81 (BP 1 Location: Left arm, BP Patient Position: At rest)   Pulse (!) 59   Temp 97.7 ??F (36.5 ??C) (Oral)   Resp 18   Ht 5' 7" (1.702 m)   Wt 184 lb (83.5 kg)   SpO2 97%   BMI 28.82 kg/m??     1. Have you been to the ER, urgent care clinic since your last visit?  Hospitalized since your last visit?No    2. Have you seen or consulted any other health care providers outside of the [facility] since your last visit?  Include any pap smears or colon screening. No

## 2018-08-08 NOTE — Progress Notes (Signed)
Madison Peters is a 67 y.o. female who presents to the office today with the following:  Chief Complaint   Patient presents with   ??? Headache   ??? Elevated Blood Pressure       HPI   Pt previously taken off her BP medication after electrolyte abnormalities and then orthostatic hypotension.   Noticed in the past week her BP has gotten pretty high. Has been very nervous about it.   160s/90-100 at home.   Did start smoking 4 cigarettes daily again a few weeks ago.  Drinks several cups of coffee daily.  Gained some weight as well from eating more quitting smoking for 5.5 months.  Returning next week for apt and labs.  Has had a headache on/off for past week.   Described as diffuse, achy.  Very anxious, thinks this is from the BP.   No other symptoms otherwise and feels well.  Denies any neuro deficits.     Review of Systems   Constitutional: Negative.    HENT: Negative.    Eyes: Negative.    Respiratory: Negative.  Negative for shortness of breath.    Cardiovascular: Negative.  Negative for chest pain and leg swelling.   Gastrointestinal: Negative.    Musculoskeletal: Negative for neck pain.   Neurological: Negative.      See HPI.    Past Medical History:   Diagnosis Date   ??? Anxiety    ??? Diabetes (HCC)    ??? Dyspepsia    ??? Emphysema    ??? Headache(784.0)    ??? Hypertension    ??? IGT (impaired glucose tolerance)    ??? Kidney stones    ??? Lipoma     Left calf   ??? Thyroid disease     hypothryoidism       Past Surgical History:   Procedure Laterality Date   ??? HX HEENT      Tonsillectomy at age 70       Allergies   Allergen Reactions   ??? Ace Inhibitors Cough   ??? Amoxicillin Nausea and Vomiting   ??? Erythromycin Other (comments)     Does not remember.   ??? Sulfa (Sulfonamide Antibiotics) Nausea and Vomiting       Current Outpatient Medications   Medication Sig   ??? losartan (COZAAR) 25 mg tablet Take 1 Tab by mouth daily.   ??? hydroCHLOROthiazide (HYDRODIURIL) 12.5 mg tablet Take 1 Tab by mouth daily.    ??? ketorolac (TORADOL) 30 mg/mL (1 mL) injection 1 mL by IntraVENous route once for 1 dose.   ??? atorvastatin (LIPITOR) 80 mg tablet TAKE 1 TABLET BY MOUTH EVERY DAY   ??? FLUoxetine (PROZAC) 20 mg capsule TAKE 1 CAPSULE BY MOUTH TWICE A DAY   ??? levothyroxine (SYNTHROID) 50 mcg tablet TAKE 1 TABLET BY MOUTH EVERY DAY   ??? aspirin delayed-release 81 mg tablet Take  by mouth daily.   ??? esomeprazole (NEXIUM) 20 mg capsule Take  by mouth daily.   ??? OTHER Taking a pro biotic Leanne Chang V daily   ??? multivitamin (ONE A DAY) tablet Take 1 Tab by mouth daily.   ??? metFORMIN (GLUCOPHAGE) 1,000 mg tablet TAKE 1&1/2 TABLETS EVERY MORNING AND 1 IN THE EVENING WITH A MEAL   ??? acetaminophen (TYLENOL) 500 mg tablet Take  by mouth every six (6) hours as needed for Pain.   ??? omega-3 fatty acids-vitamin e (FISH OIL) 1,000 mg cap Take 1 Cap by mouth four (4) times daily.  No current facility-administered medications for this visit.        Social History     Socioeconomic History   ??? Marital status: MARRIED     Spouse name: Not on file   ??? Number of children: Not on file   ??? Years of education: Not on file   ??? Highest education level: Not on file   Tobacco Use   ??? Smoking status: Former Smoker     Packs/day: 1.00     Last attempt to quit: 01/28/2018     Years since quitting: 0.5   ??? Smokeless tobacco: Never Used   Substance and Sexual Activity   ??? Alcohol use: No   ??? Drug use: No   ??? Sexual activity: Yes     Partners: Male       Family History   Problem Relation Age of Onset   ??? Heart Disease Mother    ??? Other Father         AAA         Physical Exam:  Visit Vitals  BP 151/81 (BP 1 Location: Left arm, BP Patient Position: At rest)   Pulse (!) 59   Temp 97.7 ??F (36.5 ??C) (Oral)   Resp 18   Ht 5\' 7"  (1.702 m)   Wt 184 lb (83.5 kg)   SpO2 97%   BMI 28.82 kg/m??     Physical Exam  Vitals signs and nursing note reviewed.   Constitutional:       Appearance: Normal appearance.   HENT:      Head: Normocephalic and atraumatic.       Right Ear: Tympanic membrane, ear canal and external ear normal.      Left Ear: Tympanic membrane, ear canal and external ear normal.      Mouth/Throat:      Mouth: Mucous membranes are moist.      Pharynx: Oropharynx is clear.   Eyes:      Conjunctiva/sclera: Conjunctivae normal.   Neck:      Musculoskeletal: Normal range of motion and neck supple.   Cardiovascular:      Rate and Rhythm: Normal rate and regular rhythm.   Pulmonary:      Effort: Pulmonary effort is normal.      Breath sounds: Normal breath sounds.   Skin:     General: Skin is warm and dry.   Neurological:      General: No focal deficit present.      Mental Status: She is alert and oriented to person, place, and time.      Cranial Nerves: Cranial nerves are intact.      Sensory: Sensation is intact.      Motor: Motor function is intact.      Coordination: Coordination is intact.      Gait: Gait is intact.   Psychiatric:         Mood and Affect: Mood and affect normal.         Assessment/Plan:    ICD-10-CM ICD-9-CM    1. Essential hypertension I10 401.9 losartan (COZAAR) 25 mg tablet      hydroCHLOROthiazide (HYDRODIURIL) 12.5 mg tablet   2. Nonintractable episodic headache, unspecified headache type R51 784.0 ketorolac (TORADOL) 30 mg/mL (1 mL) injection      KETOROLAC TROMETHAMINE INJ      PR THER/PROPH/DIAG INJECTION, SUBCUT/IM       tolerated injection w/o difficulty.  Med list updated, called to c/w pharmacy (pt was not taking any  BP meds and just had three prior ones sent but did not pick up or take any yet).  Counseled on alternating otc medications until HA resolves. Limit nsaid use.  Monitor BP. Encouraged LMs as well, will work on smoking cessation and cut back on caffeine.   Will return as planned for f/u next week.  Seek care in interim for any new sxs or other concerns.  To ER for any severe/worsening HA or other "red flag" sxs.  Pt verbalizes understanding and agrees with the plan.    Marlan PalauSarah S. Jenkins, PA-C

## 2018-08-08 NOTE — Patient Instructions (Signed)
DASH Diet: Care Instructions  Your Care Instructions    The DASH diet is an eating plan that can help lower your blood pressure. DASH stands for Dietary Approaches to Stop Hypertension. Hypertension is high blood pressure.  The DASH diet focuses on eating foods that are high in calcium, potassium, and magnesium. These nutrients can lower blood pressure. The foods that are highest in these nutrients are fruits, vegetables, low-fat dairy products, nuts, seeds, and legumes. But taking calcium, potassium, and magnesium supplements instead of eating foods that are high in those nutrients does not have the same effect. The DASH diet also includes whole grains, fish, and poultry.  The DASH diet is one of several lifestyle changes your doctor may recommend to lower your high blood pressure. Your doctor may also want you to decrease the amount of sodium in your diet. Lowering sodium while following the DASH diet can lower blood pressure even further than just the DASH diet alone.  Follow-up care is a key part of your treatment and safety. Be sure to make and go to all appointments, and call your doctor if you are having problems. It's also a good idea to know your test results and keep a list of the medicines you take.  How can you care for yourself at home?  Following the DASH diet  ?? Eat 4 to 5 servings of fruit each day. A serving is 1 medium-sized piece of fruit, ?? cup chopped or canned fruit, 1/4 cup dried fruit, or 4 ounces (?? cup) of fruit juice. Choose fruit more often than fruit juice.  ?? Eat 4 to 5 servings of vegetables each day. A serving is 1 cup of lettuce or raw leafy vegetables, ?? cup of chopped or cooked vegetables, or 4 ounces (?? cup) of vegetable juice. Choose vegetables more often than vegetable juice.  ?? Get 2 to 3 servings of low-fat and fat-free dairy each day. A serving is 8 ounces of milk, 1 cup of yogurt, or 1 ?? ounces of cheese.   ?? Eat 6 to 8 servings of grains each day. A serving is 1 slice of bread, 1 ounce of dry cereal, or ?? cup of cooked rice, pasta, or cooked cereal. Try to choose whole-grain products as much as possible.  ?? Limit lean meat, poultry, and fish to 2 servings each day. A serving is 3 ounces, about the size of a deck of cards.  ?? Eat 4 to 5 servings of nuts, seeds, and legumes (cooked dried beans, lentils, and split peas) each week. A serving is 1/3 cup of nuts, 2 tablespoons of seeds, or ?? cup of cooked beans or peas.  ?? Limit fats and oils to 2 to 3 servings each day. A serving is 1 teaspoon of vegetable oil or 2 tablespoons of salad dressing.  ?? Limit sweets and added sugars to 5 servings or less a week. A serving is 1 tablespoon jelly or jam, ?? cup sorbet, or 1 cup of lemonade.  ?? Eat less than 2,300 milligrams (mg) of sodium a day. If you limit your sodium to 1,500 mg a day, you can lower your blood pressure even more.  Tips for success  ?? Start small. Do not try to make dramatic changes to your diet all at once. You might feel that you are missing out on your favorite foods and then be more likely to not follow the plan. Make small changes, and stick with them. Once those changes become habit, add a   few more changes.  ?? Try some of the following:  ? Make it a goal to eat a fruit or vegetable at every meal and at snacks. This will make it easy to get the recommended amount of fruits and vegetables each day.  ? Try yogurt topped with fruit and nuts for a snack or healthy dessert.  ? Add lettuce, tomato, cucumber, and onion to sandwiches.  ? Combine a ready-made pizza crust with low-fat mozzarella cheese and lots of vegetable toppings. Try using tomatoes, squash, spinach, broccoli, carrots, cauliflower, and onions.  ? Have a variety of cut-up vegetables with a low-fat dip as an appetizer instead of chips and dip.  ? Sprinkle sunflower seeds or chopped almonds over salads. Or try adding  chopped walnuts or almonds to cooked vegetables.  ? Try some vegetarian meals using beans and peas. Add garbanzo or kidney beans to salads. Make burritos and tacos with mashed pinto beans or black beans.  Where can you learn more?  Go to [employer].  Enter [phone] in the search box to learn more about "DASH Diet: Care Instructions."  Current as of: December 10, 2017  Content Version: 12.2  ?? 2006-2019 [employer]. Care instructions adapted under license by [employer] (which disclaims liability or warranty for this information). If you have questions about a medical condition or this instruction, always ask your healthcare professional. [employer] disclaims any warranty or liability for your use of this information.         Learning About Benefits From Quitting Smoking  How does quitting smoking make you healthier?    If you're thinking about quitting smoking, you may have a few reasons to be smoke-free. Your health may be one of them.  ?? When you quit smoking, you lower your risks for cancer, lung disease, heart attack, stroke, blood vessel disease, and blindness from macular degeneration.  ?? When you're smoke-free, you get sick less often, and you heal faster. You are less likely to get colds, flu, bronchitis, and pneumonia.  ?? As a nonsmoker, you may find that your mood is better and you are less stressed.  When and how will you feel healthier?  Quitting has real health benefits that start from day 1 of being smoke-free. And the longer you stay smoke-free, the healthier you get and the better you feel.  The first hours  ?? After just 20 minutes, your blood pressure and heart rate go down. That means there's less stress on your heart and blood vessels.  ?? Within 12 hours, the level of carbon monoxide in your blood drops back to normal. That makes room for more oxygen. With more oxygen in your body,  you may notice that you have more energy than when you smoked.  After 2 weeks  ?? Your lungs start to work better.  ?? Your risk of heart attack starts to drop.  After 1 month  ?? When your lungs are clear, you cough less and breathe deeper, so it's easier to be active.  ?? Your sense of taste and smell return. That means you can enjoy food more than you have since you started smoking.  Over the years  ?? After 1 year, your risk of heart disease is half what it would be if you kept smoking.  ?? After 5 years, your risk of stroke starts to shrink. Within a few years after that, it's about the same as if you'd never smoked.  ?? After 10 years, your risk  of dying from lung cancer is cut by about half. And your risk for many other types of cancer is lower too.  How would quitting help others in your life?  When you quit smoking, you improve the health of everyone who now breathes in your smoke.  ?? Their heart, lung, and cancer risks drop, much like yours.  ?? They are sick less. For babies and small children, living smoke-free means they're less likely to have ear infections, pneumonia, and bronchitis.  ?? If you're a woman who is or will be pregnant someday, quitting smoking means a healthier newborn.  ?? Children who are close to you are less likely to become adult smokers.  Where can you learn more?  Go to http://www.healthwise.net/[employer].  Enter O319 in the search box to learn more about "Learning About Benefits From Quitting Smoking."  Current as of: May 29, 2017  Content Version: 12.2  ?? 2006-2019 [employer]. Care instructions adapted under license by [employer] (which disclaims liability or warranty for this information). If you have questions about a medical condition or this instruction, always ask your healthcare professional. [employer] disclaims any warranty or liability for your use of this information.

## 2018-08-09 ENCOUNTER — Encounter

## 2018-08-12 MED ORDER — METFORMIN 1,000 MG TAB
1000 mg | ORAL_TABLET | ORAL | 1 refills | Status: DC
Start: 2018-08-12 — End: 2018-11-17

## 2018-08-13 ENCOUNTER — Ambulatory Visit: Attending: Physician Assistant | Primary: Physician Assistant

## 2018-08-13 ENCOUNTER — Ambulatory Visit: Admit: 2018-08-13 | Discharge: 2018-08-13 | Attending: Physician Assistant | Primary: Physician Assistant

## 2018-08-13 DIAGNOSIS — I10 Essential (primary) hypertension: Secondary | ICD-10-CM

## 2018-08-13 NOTE — Progress Notes (Signed)
Please order glucometer and supplies. [facility].  She will keep a diary and bring it by office q mo for a copy to be made.  She will schedule an appt for 3 mos.

## 2018-08-13 NOTE — Progress Notes (Signed)
Notify pt labs overall look okay except her sugars are still up.  Needs to work on diet more but also would like to add another medication to her Metformin.  Please have pt come by the office in 1 - 2 weeks of BS readings to discuss.

## 2018-08-13 NOTE — Progress Notes (Signed)
Patient advised of results and she says she does not check her glucose levels at home.She wants to work on diet and stay on Metformin,and wants to know when you would like for her to return for follow up visit?

## 2018-08-13 NOTE — Addendum Note (Signed)
Addendum Note by Marlan PalauJenkins, Chrsitopher Wik S, PA-C at 08/13/18 0800                Author: Marlan PalauJenkins, Bettie Swavely S, PA-C  Service: --  Author Type: Physician Assistant       Filed: 08/19/18 1039  Encounter Date: 08/13/2018  Status: Signed          Editor: Jeanmarie HubertJenkins, Arda Keadle S, PA-C (Physician Assistant)          Addended by: Marlan PalauJENKINS, Aleja Yearwood S on: 08/19/2018 10:39 AM    Modules accepted: Orders

## 2018-08-13 NOTE — Progress Notes (Signed)
 Visit Vitals  BP 124/75 (BP 1 Location: Left arm, BP Patient Position: At rest)   Pulse 94   Temp 97.4 F (36.3 C) (Oral)   Resp 18   Ht 5' 7 (1.702 m)   Wt 182 lb (82.6 kg)   SpO2 97%   BMI 28.51 kg/m     Chief Complaint   Patient presents with   . Follow-up   . Hypertension   . Diabetes     1. Have you been to the ER, urgent care clinic since your last visit?  Hospitalized since your last visit?No    2. Have you seen or consulted any other health care providers outside of the [facility] since your last visit?  Include any pap smears or colon screening. No

## 2018-08-13 NOTE — Progress Notes (Signed)
Glucometer kit and supplies sent. Pt counseled on use and dropping off log. May stop by for nurse to assist in learning the device.

## 2018-08-13 NOTE — Progress Notes (Signed)
Madison Peters is a 67 y.o. female who presents to the office today with the following:  Chief Complaint   Patient presents with   ??? Follow-up     38month   ??? Hypertension   ??? Diabetes       HPI   Here for routine f/u and fasting blood work.  Has been doing well on current regimen.    HTN  Recently added back in hctz 12.5mg  to losartan 25 mg daily  HAs have mostly resolved and BP has been well-controlled.  No cardiac complaints.    Lab Results   Component Value Date/Time    Cholesterol, total 102 01/20/2018 10:13 AM    HDL Cholesterol 28 (L) 01/20/2018 10:13 AM    LDL, calculated 24 01/20/2018 10:13 AM    VLDL, calculated 50 (H) 01/20/2018 10:13 AM    Triglyceride 250 (H) 01/20/2018 10:13 AM   on atorvastatin 80 mg w/o SEs.    Doing well on Fluoexetine.  No active anxiety/depression sxs.    Lab Results   Component Value Date/Time    TSH 1.710 12/19/2016 01:21 PM   on levothyroxine 50 mcg.  Asxs.    Review of Systems   Eyes: Negative.    Respiratory: Negative.    Cardiovascular: Negative.    Gastrointestinal: Negative.    Genitourinary: Negative.    Musculoskeletal: Negative for myalgias.   Neurological: Negative.      See HPI.    Past Medical History:   Diagnosis Date   ??? Anxiety    ??? Diabetes (HCC)    ??? Dyspepsia    ??? Emphysema    ??? Headache(784.0)    ??? Hypertension    ??? IGT (impaired glucose tolerance)    ??? Kidney stones    ??? Lipoma     Left calf   ??? Thyroid disease     hypothryoidism       Past Surgical History:   Procedure Laterality Date   ??? HX HEENT      Tonsillectomy at age 72       Allergies   Allergen Reactions   ??? Ace Inhibitors Cough   ??? Amoxicillin Nausea and Vomiting   ??? Erythromycin Other (comments)     Does not remember.   ??? Sulfa (Sulfonamide Antibiotics) Nausea and Vomiting       Current Outpatient Medications   Medication Sig   ??? metFORMIN (GLUCOPHAGE) 1,000 mg tablet TAKE 1&1/2 TABLETS BY MOUTH EVERY MORNING AND 1 TABLET IN THE EVENING WITH A MEAL   ??? losartan (COZAAR) 25 mg tablet Take 1 Tab by  mouth daily.   ??? hydroCHLOROthiazide (HYDRODIURIL) 12.5 mg tablet Take 1 Tab by mouth daily.   ??? atorvastatin (LIPITOR) 80 mg tablet TAKE 1 TABLET BY MOUTH EVERY DAY   ??? FLUoxetine (PROZAC) 20 mg capsule TAKE 1 CAPSULE BY MOUTH TWICE A DAY   ??? levothyroxine (SYNTHROID) 50 mcg tablet TAKE 1 TABLET BY MOUTH EVERY DAY   ??? aspirin delayed-release 81 mg tablet Take  by mouth daily.   ??? esomeprazole (NEXIUM) 20 mg capsule Take  by mouth daily.   ??? multivitamin (ONE A DAY) tablet Take 1 Tab by mouth daily.   ??? acetaminophen (TYLENOL) 500 mg tablet Take  by mouth every six (6) hours as needed for Pain.   ??? omega-3 fatty acids-vitamin e (FISH OIL) 1,000 mg cap Take 1 Cap by mouth four (4) times daily.   ??? OTHER Taking a pro biotic Ivan Anchors  daily     No current facility-administered medications for this visit.        Social History     Socioeconomic History   ??? Marital status: MARRIED     Spouse name: Not on file   ??? Number of children: Not on file   ??? Years of education: Not on file   ??? Highest education level: Not on file   Tobacco Use   ??? Smoking status: Light Tobacco Smoker     Packs/day: 1.00     Last attempt to quit: 01/28/2018     Years since quitting: 0.5   ??? Smokeless tobacco: Never Used   ??? Tobacco comment: started back again after quitting for a few months   Substance and Sexual Activity   ??? Alcohol use: No   ??? Drug use: No   ??? Sexual activity: Yes     Partners: Male       Family History   Problem Relation Age of Onset   ??? Heart Disease Mother    ??? Other Father         AAA         Physical Exam:  Visit Vitals  BP 124/75 (BP 1 Location: Left arm, BP Patient Position: At rest)   Pulse 94   Temp 97.4 ??F (36.3 ??C) (Oral)   Resp 18   Ht 5\' 7"  (1.702 m)   Wt 182 lb (82.6 kg)   SpO2 97%   BMI 28.51 kg/m??     Physical Exam  Vitals signs and nursing note reviewed.   Constitutional:       Appearance: Normal appearance.   HENT:      Head: Normocephalic and atraumatic.      Mouth/Throat:      Mouth: Mucous membranes are moist.       Pharynx: Oropharynx is clear.   Eyes:      Conjunctiva/sclera: Conjunctivae normal.   Neck:      Musculoskeletal: Normal range of motion and neck supple.   Cardiovascular:      Rate and Rhythm: Normal rate and regular rhythm.   Pulmonary:      Effort: Pulmonary effort is normal.      Breath sounds: Normal breath sounds.   Abdominal:      General: Abdomen is flat.      Palpations: Abdomen is soft.      Tenderness: There is no tenderness.   Musculoskeletal: Normal range of motion.   Lymphadenopathy:      Cervical: No cervical adenopathy.   Skin:     General: Skin is warm and dry.   Neurological:      Mental Status: She is alert and oriented to person, place, and time.      Gait: Gait is intact.   Psychiatric:         Mood and Affect: Mood and affect normal.         Behavior: Behavior normal.         Assessment/Plan:    ICD-10-CM ICD-9-CM    1. Essential hypertension I10 401.9 METABOLIC PANEL, COMPREHENSIVE      CBC WITH AUTOMATED DIFF      PR HANDLG&/OR CONVEY OF SPEC FOR TR OFFICE TO LAB      COLLECTION VENOUS BLOOD,VENIPUNCTURE   2. Thyroid disease E07.9 246.9 TSH 3RD GENERATION   3. 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 Hyperlipidemia, unspecified hyperlipidemia  type E78.5 272.4 LIPID PANEL   5. Light smoker F17.210 305.1        Follow-up and Dispositions    ?? Return in about 6 months (around 02/12/2019), or sooner prn or otherwise instructed pending BW.     continue current regimen.  Praised healthy LMs. Encouraged smoking cessation (just started up again after quitting).  Routine f/u 6 mo.  Seek care in interim for any new sxs or other concerns.  Pt verbalizes understanding and agrees with the plan.    Marlan PalauSarah S. Jenkins, PA-C

## 2018-08-13 NOTE — Progress Notes (Signed)
Patient advised of results and she says she does not check her glucose levels at home.She wants to work on diet and stay on Metformin,and wants to know when you would like for her to return for follow up visit?

## 2018-08-13 NOTE — Progress Notes (Signed)
Visit Vitals  BP 124/75 (BP 1 Location: Left arm, BP Patient Position: At rest)   Pulse 94   Temp 97.4 ??F (36.3 ??C) (Oral)   Resp 18   Ht 5' 7" (1.702 m)   Wt 182 lb (82.6 kg)   SpO2 97%   BMI 28.51 kg/m??     Chief Complaint   Patient presents with   ??? Follow-up   ??? Hypertension   ??? Diabetes     1. Have you been to the ER, urgent care clinic since your last visit?  Hospitalized since your last visit?No    2. Have you seen or consulted any other health care providers outside of the [facility] since your last visit?  Include any pap smears or colon screening. No

## 2018-08-13 NOTE — Progress Notes (Signed)
Madison Peters is a 67 y.o. female who presents to the office today with the following:  Chief Complaint   Patient presents with   ??? Follow-up     38month   ??? Hypertension   ??? Diabetes       HPI   Here for routine f/u and fasting blood work.  Has been doing well on current regimen.    HTN  Recently added back in hctz 12.5mg  to losartan 25 mg daily  HAs have mostly resolved and BP has been well-controlled.  No cardiac complaints.    Lab Results   Component Value Date/Time    Cholesterol, total 102 01/20/2018 10:13 AM    HDL Cholesterol 28 (L) 01/20/2018 10:13 AM    LDL, calculated 24 01/20/2018 10:13 AM    VLDL, calculated 50 (H) 01/20/2018 10:13 AM    Triglyceride 250 (H) 01/20/2018 10:13 AM   on atorvastatin 80 mg w/o SEs.    Doing well on Fluoexetine.  No active anxiety/depression sxs.    Lab Results   Component Value Date/Time    TSH 1.710 12/19/2016 01:21 PM   on levothyroxine 50 mcg.  Asxs.    Review of Systems   Eyes: Negative.    Respiratory: Negative.    Cardiovascular: Negative.    Gastrointestinal: Negative.    Genitourinary: Negative.    Musculoskeletal: Negative for myalgias.   Neurological: Negative.      See HPI.    Past Medical History:   Diagnosis Date   ??? Anxiety    ??? Diabetes (HCC)    ??? Dyspepsia    ??? Emphysema    ??? Headache(784.0)    ??? Hypertension    ??? IGT (impaired glucose tolerance)    ??? Kidney stones    ??? Lipoma     Left calf   ??? Thyroid disease     hypothryoidism       Past Surgical History:   Procedure Laterality Date   ??? HX HEENT      Tonsillectomy at age 13       Allergies   Allergen Reactions   ??? Ace Inhibitors Cough   ??? Amoxicillin Nausea and Vomiting   ??? Erythromycin Other (comments)     Does not remember.   ??? Sulfa (Sulfonamide Antibiotics) Nausea and Vomiting       Current Outpatient Medications   Medication Sig   ??? metFORMIN (GLUCOPHAGE) 1,000 mg tablet TAKE 1&1/2 TABLETS BY MOUTH EVERY MORNING AND 1 TABLET IN THE EVENING WITH A MEAL    ??? losartan (COZAAR) 25 mg tablet Take 1 Tab by mouth daily.   ??? hydroCHLOROthiazide (HYDRODIURIL) 12.5 mg tablet Take 1 Tab by mouth daily.   ??? atorvastatin (LIPITOR) 80 mg tablet TAKE 1 TABLET BY MOUTH EVERY DAY   ??? FLUoxetine (PROZAC) 20 mg capsule TAKE 1 CAPSULE BY MOUTH TWICE A DAY   ??? levothyroxine (SYNTHROID) 50 mcg tablet TAKE 1 TABLET BY MOUTH EVERY DAY   ??? aspirin delayed-release 81 mg tablet Take  by mouth daily.   ??? esomeprazole (NEXIUM) 20 mg capsule Take  by mouth daily.   ??? multivitamin (ONE A DAY) tablet Take 1 Tab by mouth daily.   ??? acetaminophen (TYLENOL) 500 mg tablet Take  by mouth every six (6) hours as needed for Pain.   ??? omega-3 fatty acids-vitamin e (FISH OIL) 1,000 mg cap Take 1 Cap by mouth four (4) times daily.   ??? OTHER Taking a pro biotic Ivan Anchors  daily     No current facility-administered medications for this visit.        Social History     Socioeconomic History   ??? Marital status: MARRIED     Spouse name: Not on file   ??? Number of children: Not on file   ??? Years of education: Not on file   ??? Highest education level: Not on file   Tobacco Use   ??? Smoking status: Light Tobacco Smoker     Packs/day: 1.00     Last attempt to quit: 01/28/2018     Years since quitting: 0.5   ??? Smokeless tobacco: Never Used   ??? Tobacco comment: started back again after quitting for a few months   Substance and Sexual Activity   ??? Alcohol use: No   ??? Drug use: No   ??? Sexual activity: Yes     Partners: Male       Family History   Problem Relation Age of Onset   ??? Heart Disease Mother    ??? Other Father         AAA         Physical Exam:  Visit Vitals  BP 124/75 (BP 1 Location: Left arm, BP Patient Position: At rest)   Pulse 94   Temp 97.4 ??F (36.3 ??C) (Oral)   Resp 18   Ht 5\' 7"  (1.702 m)   Wt 182 lb (82.6 kg)   SpO2 97%   BMI 28.51 kg/m??     Physical Exam  Vitals signs and nursing note reviewed.   Constitutional:       Appearance: Normal appearance.   HENT:      Head: Normocephalic and atraumatic.       Mouth/Throat:      Mouth: Mucous membranes are moist.      Pharynx: Oropharynx is clear.   Eyes:      Conjunctiva/sclera: Conjunctivae normal.   Neck:      Musculoskeletal: Normal range of motion and neck supple.   Cardiovascular:      Rate and Rhythm: Normal rate and regular rhythm.   Pulmonary:      Effort: Pulmonary effort is normal.      Breath sounds: Normal breath sounds.   Abdominal:      General: Abdomen is flat.      Palpations: Abdomen is soft.      Tenderness: There is no tenderness.   Musculoskeletal: Normal range of motion.   Lymphadenopathy:      Cervical: No cervical adenopathy.   Skin:     General: Skin is warm and dry.   Neurological:      Mental Status: She is alert and oriented to person, place, and time.      Gait: Gait is intact.   Psychiatric:         Mood and Affect: Mood and affect normal.         Behavior: Behavior normal.         Assessment/Plan:    ICD-10-CM ICD-9-CM    1. Essential hypertension I10 401.9 METABOLIC PANEL, COMPREHENSIVE      CBC WITH AUTOMATED DIFF      PR HANDLG&/OR CONVEY OF SPEC FOR TR OFFICE TO LAB      COLLECTION VENOUS BLOOD,VENIPUNCTURE   2. Thyroid disease E07.9 246.9 TSH 3RD GENERATION   3. 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 Hyperlipidemia, unspecified hyperlipidemia  type E78.5 272.4 LIPID PANEL   5. Light smoker F17.210 305.1        Follow-up and Dispositions    ?? Return in about 6 months (around 02/12/2019), or sooner prn or otherwise instructed pending BW.     continue current regimen.  Praised healthy LMs. Encouraged smoking cessation (just started up again after quitting).  Routine f/u 6 mo.  Seek care in interim for any new sxs or other concerns.  Pt verbalizes understanding and agrees with the plan.    Marlan PalauSarah S. Jenkins, PA-C

## 2018-08-13 NOTE — Progress Notes (Signed)
Notify pt labs overall look okay except her sugars are still up.  Needs to work on diet more but also would like to add another medication to her Metformin.  Please have pt come by the office in 1 - 2 weeks of BS readings to discuss.

## 2018-08-13 NOTE — Addendum Note (Signed)
Addended by: Marlan PalauJENKINS, Cantrell Larouche S on: 08/19/2018 10:39 AM     Modules accepted: Orders

## 2018-08-13 NOTE — Progress Notes (Signed)
Please order glucometer and supplies. [facility].  She will keep a diary and bring it by office q mo for a copy to be made.  She will schedule an appt for 3 mos.

## 2018-08-14 LAB — HEMOGLOBIN A1C WITH EAG
Estimated average glucose: 194 mg/dL
Hemoglobin A1c: 8.4 % — ABNORMAL HIGH (ref 4.8–5.6)

## 2018-08-14 LAB — CBC WITH AUTOMATED DIFF
ABS. BASOPHILS: 0.1 10*3/uL (ref 0.0–0.2)
ABS. EOSINOPHILS: 0.2 10*3/uL (ref 0.0–0.4)
ABS. IMM. GRANS.: 0.3 10*3/uL — ABNORMAL HIGH (ref 0.0–0.1)
ABS. MONOCYTES: 0.6 10*3/uL (ref 0.1–0.9)
ABS. NEUTROPHILS: 4.5 10*3/uL (ref 1.4–7.0)
Abs Lymphocytes: 1.6 10*3/uL (ref 0.7–3.1)
BASOPHILS: 2 %
EOSINOPHILS: 3 %
HCT: 41.8 % (ref 34.0–46.6)
HGB: 14 g/dL (ref 11.1–15.9)
IMMATURE GRANULOCYTES: 4 %
Lymphocytes: 22 %
MCH: 29.9 pg (ref 26.6–33.0)
MCHC: 33.5 g/dL (ref 31.5–35.7)
MCV: 89 fL (ref 79–97)
MONOCYTES: 8 %
NEUTROPHILS: 61 %
PLATELET: 298 10*3/uL (ref 150–450)
RBC: 4.69 x10E6/uL (ref 3.77–5.28)
RDW: 12.9 % (ref 12.3–15.4)
WBC: 7.3 10*3/uL (ref 3.4–10.8)

## 2018-08-14 LAB — LIPID PANEL
Cholesterol, Total: 91 mg/dL — ABNORMAL LOW (ref 100–199)
Cholesterol, total: 91 mg/dL — ABNORMAL LOW (ref 100–199)
HDL Cholesterol: 33 mg/dL — ABNORMAL LOW (ref >39)
HDL: 33 mg/dL — ABNORMAL LOW (ref >39)
LDL Calculated: 29 mg/dL (ref 0–99)
LDL, calculated: 29 mg/dL (ref 0–99)
Triglyceride: 143 mg/dL (ref 0–149)
Triglycerides: 143 mg/dL (ref 0–149)
VLDL Cholesterol Calculated: 29 mg/dL (ref 5–40)
VLDL, calculated: 29 mg/dL (ref 5–40)

## 2018-08-14 LAB — METABOLIC PANEL, COMPREHENSIVE
A-G Ratio: 2.6 — ABNORMAL HIGH (ref 1.2–2.2)
ALT (SGPT): 21 IU/L (ref 0–32)
AST (SGOT): 17 IU/L (ref 0–40)
Albumin: 4.6 g/dL (ref 3.6–4.8)
Alk. phosphatase: 71 IU/L (ref 39–117)
BUN/Creatinine ratio: 12 (ref 12–28)
BUN: 9 mg/dL (ref 8–27)
Bilirubin, total: 0.5 mg/dL (ref 0.0–1.2)
CO2: 21 mmol/L (ref 20–29)
Calcium: 9.2 mg/dL (ref 8.7–10.3)
Chloride: 96 mmol/L (ref 96–106)
Creatinine: 0.77 mg/dL (ref 0.57–1.00)
GFR est AA: 93 mL/min/{1.73_m2} (ref >59)
GFR est non-AA: 81 mL/min/{1.73_m2} (ref >59)
GLOBULIN, TOTAL: 1.8 g/dL (ref 1.5–4.5)
Glucose: 217 mg/dL — ABNORMAL HIGH (ref 65–99)
Potassium: 4.1 mmol/L (ref 3.5–5.2)
Protein, total: 6.4 g/dL (ref 6.0–8.5)
Sodium: 139 mmol/L (ref 134–144)

## 2018-08-14 LAB — TSH 3RD GENERATION
TSH: 1.6 u[IU]/mL (ref 0.450–4.500)
TSH: 1.6 u[IU]/mL (ref 0.450–4.500)

## 2018-08-14 LAB — CVD REPORT

## 2018-08-14 LAB — CBC WITH AUTO DIFFERENTIAL
Basophils %: 2 %
Basophils Absolute: 0.1 10*3/uL (ref 0.0–0.2)
Eosinophils %: 3 %
Eosinophils Absolute: 0.2 10*3/uL (ref 0.0–0.4)
Granulocyte Absolute Count: 0.3 10*3/uL — ABNORMAL HIGH (ref 0.0–0.1)
Hematocrit: 41.8 % (ref 34.0–46.6)
Hemoglobin: 14 g/dL (ref 11.1–15.9)
Immature Granulocytes: 4 %
Lymphocytes %: 22 %
Lymphocytes Absolute: 1.6 10*3/uL (ref 0.7–3.1)
MCH: 29.9 pg (ref 26.6–33.0)
MCHC: 33.5 g/dL (ref 31.5–35.7)
MCV: 89 fL (ref 79–97)
Monocytes %: 8 %
Monocytes Absolute: 0.6 10*3/uL (ref 0.1–0.9)
Neutrophils %: 61 %
Neutrophils Absolute: 4.5 10*3/uL (ref 1.4–7.0)
Platelets: 298 10*3/uL (ref 150–450)
RBC: 4.69 x10E6/uL (ref 3.77–5.28)
RDW: 12.9 % (ref 12.3–15.4)
WBC: 7.3 10*3/uL (ref 3.4–10.8)

## 2018-08-14 LAB — COMPREHENSIVE METABOLIC PANEL
ALT: 21 IU/L (ref 0–32)
AST: 17 IU/L (ref 0–40)
Albumin/Globulin Ratio: 2.6 NA — ABNORMAL HIGH (ref 1.2–2.2)
Albumin: 4.6 g/dL (ref 3.6–4.8)
Alkaline Phosphatase: 71 IU/L (ref 39–117)
BUN: 9 mg/dL (ref 8–27)
Bun/Cre Ratio: 12 NA (ref 12–28)
CO2: 21 mmol/L (ref 20–29)
Calcium: 9.2 mg/dL (ref 8.7–10.3)
Chloride: 96 mmol/L (ref 96–106)
Creatinine: 0.77 mg/dL (ref 0.57–1.00)
EGFR IF NonAfrican American: 81 mL/min/{1.73_m2} (ref >59)
GFR African American: 93 mL/min/{1.73_m2} (ref >59)
Globulin, Total: 1.8 g/dL (ref 1.5–4.5)
Glucose: 217 mg/dL — ABNORMAL HIGH (ref 65–99)
Potassium: 4.1 mmol/L (ref 3.5–5.2)
Sodium: 139 mmol/L (ref 134–144)
Total Bilirubin: 0.5 mg/dL (ref 0.0–1.2)
Total Protein: 6.4 g/dL (ref 6.0–8.5)

## 2018-08-14 LAB — HEMOGLOBIN A1C W/EAG
Hemoglobin A1C: 8.4 % — ABNORMAL HIGH (ref 4.8–5.6)
eAG: 194 mg/dL

## 2018-08-19 MED ORDER — BLOOD GLUCOSE METER KIT
PACK | 0 refills | Status: DC
Start: 2018-08-19 — End: 2018-08-22

## 2018-08-19 MED ORDER — LANCETS
5 refills | Status: DC
Start: 2018-08-19 — End: 2018-08-22

## 2018-08-19 MED ORDER — BLOOD SUGAR DIAGNOSTIC TEST STRIPS
ORAL_STRIP | 5 refills | Status: DC
Start: 2018-08-19 — End: 2018-08-22

## 2018-08-22 ENCOUNTER — Encounter

## 2018-08-22 MED ORDER — LANCETS
5 refills | Status: AC
Start: 2018-08-22 — End: ?

## 2018-08-22 MED ORDER — BLOOD SUGAR DIAGNOSTIC TEST STRIPS
ORAL_STRIP | 5 refills | Status: DC
Start: 2018-08-22 — End: 2018-10-29

## 2018-08-22 MED ORDER — BLOOD GLUCOSE METER KIT
PACK | 0 refills | Status: DC
Start: 2018-08-22 — End: 2018-10-29

## 2018-08-22 NOTE — Telephone Encounter (Signed)
Received from [facility] Rx need to be redone for the glucometer and supplies. Needs to states either once daily BS checks in non-insulin dep diabetics. New Rx sent.

## 2018-08-30 ENCOUNTER — Encounter

## 2018-09-01 MED ORDER — LOSARTAN 25 MG TAB
25 mg | ORAL_TABLET | ORAL | 0 refills | Status: DC
Start: 2018-09-01 — End: 2018-10-01

## 2018-09-01 MED ORDER — HYDROCHLOROTHIAZIDE 12.5 MG TAB
12.5 mg | ORAL_TABLET | ORAL | 0 refills | Status: DC
Start: 2018-09-01 — End: 2018-10-01

## 2018-10-01 ENCOUNTER — Encounter

## 2018-10-01 MED ORDER — LOSARTAN 25 MG TAB
25 mg | ORAL_TABLET | ORAL | 0 refills | Status: DC
Start: 2018-10-01 — End: 2018-10-29

## 2018-10-01 MED ORDER — HYDROCHLOROTHIAZIDE 12.5 MG TAB
12.5 mg | ORAL_TABLET | ORAL | 0 refills | Status: DC
Start: 2018-10-01 — End: 2018-10-29

## 2018-10-28 ENCOUNTER — Telehealth

## 2018-10-28 ENCOUNTER — Encounter

## 2018-10-29 NOTE — Telephone Encounter (Signed)
New rx sent for accucheck brand.

## 2018-10-30 MED ORDER — HYDROCHLOROTHIAZIDE 12.5 MG TAB
12.5 mg | ORAL_TABLET | ORAL | 5 refills | Status: DC
Start: 2018-10-30 — End: 2019-03-18

## 2018-10-30 MED ORDER — BLOOD SUGAR DIAGNOSTIC TEST STRIPS
ORAL_STRIP | 5 refills | Status: DC
Start: 2018-10-30 — End: 2021-07-04

## 2018-10-30 MED ORDER — BLOOD GLUCOSE METER KIT
PACK | 0 refills | Status: AC
Start: 2018-10-30 — End: ?

## 2018-10-30 MED ORDER — LOSARTAN 25 MG TAB
25 mg | ORAL_TABLET | ORAL | 5 refills | Status: DC
Start: 2018-10-30 — End: 2019-03-18

## 2018-11-17 ENCOUNTER — Ambulatory Visit: Attending: Physician Assistant | Primary: Physician Assistant

## 2018-11-17 ENCOUNTER — Ambulatory Visit: Admit: 2018-11-17 | Discharge: 2018-11-17 | Attending: Physician Assistant | Primary: Physician Assistant

## 2018-11-17 DIAGNOSIS — E1121 Type 2 diabetes mellitus with diabetic nephropathy: Secondary | ICD-10-CM

## 2018-11-17 MED ORDER — METFORMIN 1,000 MG TAB
1000 mg | ORAL_TABLET | ORAL | 1 refills | Status: DC
Start: 2018-11-17 — End: 2019-07-23

## 2018-11-17 NOTE — Progress Notes (Signed)
I have called and talked to this patient and given her the lab results per [facility], PA-C's review.  Patient verbalizes understanding.

## 2018-11-17 NOTE — Addendum Note (Signed)
Addendum Note by Marlan Palau, PA-C at 11/17/18 0800                Author: Marlan Palau, PA-C  Service: --  Author Type: Physician Assistant       Filed: 11/20/18 1517  Encounter Date: 11/17/2018  Status: Signed          Editor: Jeanmarie Hubert (Physician Assistant)          Addended by: Marlan Palau on: 11/20/2018 03:17 PM    Modules accepted: Orders

## 2018-11-17 NOTE — Progress Notes (Signed)
Notify pt A1c still high. I am going to try her on one more addnl medication, will send to pharmacy and have her recheck a1c with ov in 3 mo.  Also she can try cutting her cholesterol med in half to see how her numbers do by then, her trig are still high but her chol is low. Cont to avoid trans/sat fats.  Kidney tests and electrolytes look good otherwise.

## 2018-11-17 NOTE — Progress Notes (Signed)
Madison Peters is a 68 y.o. female who presents to the office today with the following:  Chief Complaint   Patient presents with   ??? Follow-up   ??? Hypertension   ??? Diabetes       HPI   Here for routine f/u and refills.  Is also fasting for labs.    HTN  BP has been doing well at home. 130s/70s.   No cardiac complaints.  Doing well with her losartan/hctz.    T2DM.  Lab Results   Component Value Date/Time    Hemoglobin A1c 8.4 (H) 08/13/2018 08:39 AM    Hemoglobin A1c (POC) 7.1 05/14/2018 08:58 AM   has been having some issues with her sugars.   Did not get new machine until Jan.     Lab Results   Component Value Date/Time    Cholesterol, total 91 (L) 08/13/2018 08:39 AM    HDL Cholesterol 33 (L) 08/13/2018 08:39 AM    LDL, calculated 29 08/13/2018 08:39 AM    VLDL, calculated 29 08/13/2018 08:39 AM    Triglyceride 143 08/13/2018 08:39 AM   on atorvastatin 80 mg. Doing well. No myalgias.  May halve dose after these labs.     Lab Results   Component Value Date/Time    TSH 1.600 08/13/2018 08:39 AM   asxs. Doing well on 50 mcg levothyroxine.      Had colonoscopy with walter reed.  Told to return in 10     Anxiety well controlled with fluoxetine 20 mg.  Denies depression or other psych sxs.    Down to 5 cigarettes a day.   Down from 2 ppd.a    Review of Systems   Constitutional: Negative.    Respiratory: Negative.         Emphysema stable   Cardiovascular: Negative for chest pain, palpitations and leg swelling.   Gastrointestinal: Negative.    Neurological: Negative.    Psychiatric/Behavioral: Negative for depression, hallucinations, memory loss, substance abuse and suicidal ideas.   All other systems reviewed and are negative.    See HPI.    Past Medical History:   Diagnosis Date   ??? Anxiety    ??? Diabetes ([place])    ??? Dyspepsia    ??? Emphysema    ??? Headache(784.0)    ??? Hypertension    ??? IGT (impaired glucose tolerance)    ??? Kidney stones    ??? Lipoma     Left calf   ??? Thyroid disease     hypothryoidism       Past Surgical  History:   Procedure Laterality Date   ??? HX HEENT      Tonsillectomy at age 4       Allergies   Allergen Reactions   ??? Ace Inhibitors Cough   ??? Amoxicillin Nausea and Vomiting   ??? Erythromycin Other (comments)     Does not remember.   ??? Sulfa (Sulfonamide Antibiotics) Nausea and Vomiting       Current Outpatient Medications   Medication Sig   ??? metFORMIN (GLUCOPHAGE) 1,000 mg tablet TAKE 1&1/2 TABLETS BY MOUTH EVERY MORNING AND 1 TABLET IN THE EVENING WITH A MEAL   ??? hydroCHLOROthiazide (HYDRODIURIL) 12.5 mg tablet TAKE 1 TABLET BY MOUTH EVERY DAY   ??? losartan (COZAAR) 25 mg tablet TAKE 1 TABLET BY MOUTH EVERY DAY   ??? Blood-Glucose Meter monitoring kit accucheck meter   ??? glucose blood VI test strips (ACCU-CHEK AVIVA PLUS TEST STRP) strip accucheck strips   ???  lancets misc Use for once daily BS checks   ??? atorvastatin (LIPITOR) 80 mg tablet TAKE 1 TABLET BY MOUTH EVERY DAY   ??? FLUoxetine (PROZAC) 20 mg capsule TAKE 1 CAPSULE BY MOUTH TWICE A DAY   ??? levothyroxine (SYNTHROID) 50 mcg tablet TAKE 1 TABLET BY MOUTH EVERY DAY   ??? aspirin delayed-release 81 mg tablet Take  by mouth daily.   ??? esomeprazole (NEXIUM) 20 mg capsule Take  by mouth daily.   ??? OTHER Taking a pro biotic Elvis Coil V daily   ??? multivitamin (ONE A DAY) tablet Take 1 Tab by mouth daily.   ??? acetaminophen (TYLENOL) 500 mg tablet Take  by mouth every six (6) hours as needed for Pain.   ??? omega-3 fatty acids-vitamin e (FISH OIL) 1,000 mg cap Take 1 Cap by mouth four (4) times daily.     No current facility-administered medications for this visit.        Social History     Socioeconomic History   ??? Marital status: MARRIED     Spouse name: Not on file   ??? Number of children: Not on file   ??? Years of education: Not on file   ??? Highest education level: Not on file   Tobacco Use   ??? Smoking status: Light Tobacco Smoker     Packs/day: 1.00     Years: 50.00     Pack years: 50.00     Last attempt to quit: 01/28/2018     Years since quitting: 0.8   ??? Smokeless  tobacco: Never Used   ??? Tobacco comment: started back again after quitting for a few months   Substance and Sexual Activity   ??? Alcohol use: No   ??? Drug use: No   ??? Sexual activity: Yes     Partners: Male       Family History   Problem Relation Age of Onset   ??? Heart Disease Mother    ??? Other Father         AAA         Physical Exam:  Visit Vitals  BP 134/77 (BP 1 Location: Left arm, BP Patient Position: At rest)   Pulse 69   Temp 98.2 ??F (36.8 ??C) (Oral)   Resp 18   Ht '5\' 7"'  (1.702 m)   Wt 173 lb (78.5 kg)   SpO2 99%   BMI 27.10 kg/m??     Physical Exam  Vitals signs and nursing note reviewed.   Constitutional:       Appearance: Normal appearance.   HENT:      Head: Normocephalic and atraumatic.   Eyes:      Conjunctiva/sclera: Conjunctivae normal.   Neck:      Musculoskeletal: Neck supple.   Cardiovascular:      Rate and Rhythm: Normal rate and regular rhythm.      Pulses: Normal pulses.      Heart sounds: Normal heart sounds.   Pulmonary:      Effort: Pulmonary effort is normal. No respiratory distress.      Breath sounds: No stridor. No rhonchi.      Comments: Scattered light wheeze  Musculoskeletal:         General: No swelling.   Skin:     General: Skin is warm and dry.   Neurological:      Mental Status: She is alert and oriented to person, place, and time.   Psychiatric:  Mood and Affect: Mood normal.         Behavior: Behavior normal.         Assessment/Plan:    ICD-10-CM ICD-9-CM    1. 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 Hyperlipidemia, unspecified hyperlipidemia type E78.5 272.4 LIPID PANEL   3. Essential hypertension J00 938.1 METABOLIC PANEL, BASIC   4. Pulmonary emphysema, unspecified emphysema type ([place]) J43.9 492.8    5. Anxiety F41.9 300.00      3-6 mo f/u pending  labs.  Praised pt smoking cessation efforts and other healthy LMs.  Will notify of labs and any further recommendations.    Follow-up and Dispositions    ?? Return sooner prn.       Seek care in interim for any new sxs or other concerns.  Pt verbalizes understanding and agrees with the plan.    Juanna Cao, PA-C

## 2018-11-17 NOTE — Progress Notes (Signed)
Chief Complaint   Patient presents with   . Follow-up   . Hypertension   . Diabetes     Visit Vitals  BP 134/77 (BP 1 Location: Left arm, BP Patient Position: At rest)   Pulse 69   Temp 98.2 F (36.8 C) (Oral)   Resp 18   Ht 5\' 7"  (1.702 m)   Wt 173 lb (78.5 kg)   SpO2 99%   BMI 27.10 kg/m     1. Have you been to the ER, urgent care clinic since your last visit?  Hospitalized since your last visit?No    2. Have you seen or consulted any other health care providers outside of the [facility] since your last visit?  Include any pap smears or colon screening. No

## 2018-11-17 NOTE — Addendum Note (Signed)
Addended by: Marlan Palau on: 11/20/2018 03:17 PM     Modules accepted: Orders

## 2018-11-17 NOTE — Progress Notes (Signed)
Madison Peters is a 68 y.o. female who presents to the office today with the following:  Chief Complaint   Patient presents with   ??? Follow-up   ??? Hypertension   ??? Diabetes       HPI   Here for routine f/u and refills.  Is also fasting for labs.    HTN  BP has been doing well at home. 130s/70s.   No cardiac complaints.  Doing well with her losartan/hctz.    T2DM.  Lab Results   Component Value Date/Time    Hemoglobin A1c 8.4 (H) 08/13/2018 08:39 AM    Hemoglobin A1c (POC) 7.1 05/14/2018 08:58 AM   has been having some issues with her sugars.   Did not get new machine until Jan.     Lab Results   Component Value Date/Time    Cholesterol, total 91 (L) 08/13/2018 08:39 AM    HDL Cholesterol 33 (L) 08/13/2018 08:39 AM    LDL, calculated 29 08/13/2018 08:39 AM    VLDL, calculated 29 08/13/2018 08:39 AM    Triglyceride 143 08/13/2018 08:39 AM   on atorvastatin 80 mg. Doing well. No myalgias.  May halve dose after these labs.     Lab Results   Component Value Date/Time    TSH 1.600 08/13/2018 08:39 AM   asxs. Doing well on 50 mcg levothyroxine.      Had colonoscopy with walter reed.  Told to return in 10     Anxiety well controlled with fluoxetine 20 mg.  Denies depression or other psych sxs.    Down to 5 cigarettes a day.   Down from 2 ppd.a    Review of Systems   Constitutional: Negative.    Respiratory: Negative.         Emphysema stable   Cardiovascular: Negative for chest pain, palpitations and leg swelling.   Gastrointestinal: Negative.    Neurological: Negative.    Psychiatric/Behavioral: Negative for depression, hallucinations, memory loss, substance abuse and suicidal ideas.   All other systems reviewed and are negative.    See HPI.    Past Medical History:   Diagnosis Date   ??? Anxiety    ??? Diabetes ([place])    ??? Dyspepsia    ??? Emphysema    ??? Headache(784.0)    ??? Hypertension    ??? IGT (impaired glucose tolerance)    ??? Kidney stones    ??? Lipoma     Left calf   ??? Thyroid disease     hypothryoidism        Past Surgical History:   Procedure Laterality Date   ??? HX HEENT      Tonsillectomy at age 89       Allergies   Allergen Reactions   ??? Ace Inhibitors Cough   ??? Amoxicillin Nausea and Vomiting   ??? Erythromycin Other (comments)     Does not remember.   ??? Sulfa (Sulfonamide Antibiotics) Nausea and Vomiting       Current Outpatient Medications   Medication Sig   ??? metFORMIN (GLUCOPHAGE) 1,000 mg tablet TAKE 1&1/2 TABLETS BY MOUTH EVERY MORNING AND 1 TABLET IN THE EVENING WITH A MEAL   ??? hydroCHLOROthiazide (HYDRODIURIL) 12.5 mg tablet TAKE 1 TABLET BY MOUTH EVERY DAY   ??? losartan (COZAAR) 25 mg tablet TAKE 1 TABLET BY MOUTH EVERY DAY   ??? Blood-Glucose Meter monitoring kit accucheck meter   ??? glucose blood VI test strips (ACCU-CHEK AVIVA PLUS TEST STRP) strip accucheck strips   ???  lancets misc Use for once daily BS checks   ??? atorvastatin (LIPITOR) 80 mg tablet TAKE 1 TABLET BY MOUTH EVERY DAY   ??? FLUoxetine (PROZAC) 20 mg capsule TAKE 1 CAPSULE BY MOUTH TWICE A DAY   ??? levothyroxine (SYNTHROID) 50 mcg tablet TAKE 1 TABLET BY MOUTH EVERY DAY   ??? aspirin delayed-release 81 mg tablet Take  by mouth daily.   ??? esomeprazole (NEXIUM) 20 mg capsule Take  by mouth daily.   ??? OTHER Taking a pro biotic [place] V daily   ??? multivitamin (ONE A DAY) tablet Take 1 Tab by mouth daily.   ??? acetaminophen (TYLENOL) 500 mg tablet Take  by mouth every six (6) hours as needed for Pain.   ??? omega-3 fatty acids-vitamin e (FISH OIL) 1,000 mg cap Take 1 Cap by mouth four (4) times daily.     No current facility-administered medications for this visit.        Social History     Socioeconomic History   ??? Marital status: MARRIED     Spouse name: Not on file   ??? Number of children: Not on file   ??? Years of education: Not on file   ??? Highest education level: Not on file   Tobacco Use   ??? Smoking status: Light Tobacco Smoker     Packs/day: 1.00     Years: 50.00     Pack years: 50.00     Last attempt to quit: 01/28/2018     Years since quitting: 0.8    ??? Smokeless tobacco: Never Used   ??? Tobacco comment: started back again after quitting for a few months   Substance and Sexual Activity   ??? Alcohol use: No   ??? Drug use: No   ??? Sexual activity: Yes     Partners: Male       Family History   Problem Relation Age of Onset   ??? Heart Disease Mother    ??? Other Father         AAA         Physical Exam:  Visit Vitals  BP 134/77 (BP 1 Location: Left arm, BP Patient Position: At rest)   Pulse 69   Temp 98.2 ??F (36.8 ??C) (Oral)   Resp 18   Ht 5' 7"  (1.702 m)   Wt 173 lb (78.5 kg)   SpO2 99%   BMI 27.10 kg/m??     Physical Exam  Vitals signs and nursing note reviewed.   Constitutional:       Appearance: Normal appearance.   HENT:      Head: Normocephalic and atraumatic.   Eyes:      Conjunctiva/sclera: Conjunctivae normal.   Neck:      Musculoskeletal: Neck supple.   Cardiovascular:      Rate and Rhythm: Normal rate and regular rhythm.      Pulses: Normal pulses.      Heart sounds: Normal heart sounds.   Pulmonary:      Effort: Pulmonary effort is normal. No respiratory distress.      Breath sounds: No stridor. No rhonchi.      Comments: Scattered light wheeze  Musculoskeletal:         General: No swelling.   Skin:     General: Skin is warm and dry.   Neurological:      Mental Status: She is alert and oriented to person, place, and time.   Psychiatric:  Mood and Affect: Mood normal.         Behavior: Behavior normal.         Assessment/Plan:    ICD-10-CM ICD-9-CM    1. 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 Hyperlipidemia, unspecified hyperlipidemia type E78.5 272.4 LIPID PANEL   3. Essential hypertension N17 001.7 METABOLIC PANEL, BASIC   4. Pulmonary emphysema, unspecified emphysema type ([place]) J43.9 492.8    5. Anxiety F41.9 300.00       3-6 mo f/u pending labs.  Praised pt smoking cessation efforts and other healthy LMs.  Will notify of labs and any further recommendations.    Follow-up and Dispositions    ?? Return sooner prn.       Seek care in interim for any new sxs or other concerns.  Pt verbalizes understanding and agrees with the plan.    Juanna Cao, PA-C

## 2018-11-17 NOTE — Progress Notes (Signed)
Chief Complaint   Patient presents with   ??? Follow-up   ??? Hypertension   ??? Diabetes     Visit Vitals  BP 134/77 (BP 1 Location: Left arm, BP Patient Position: At rest)   Pulse 69   Temp 98.2 ??F (36.8 ??C) (Oral)   Resp 18   Ht 5' 7" (1.702 m)   Wt 173 lb (78.5 kg)   SpO2 99%   BMI 27.10 kg/m??     1. Have you been to the ER, urgent care clinic since your last visit?  Hospitalized since your last visit?No    2. Have you seen or consulted any other health care providers outside of the [facility] since your last visit?  Include any pap smears or colon screening. No

## 2018-11-17 NOTE — Progress Notes (Signed)
Notify pt A1c still high. I am going to try her on one more addnl medication, will send to pharmacy and have her recheck a1c with ov in 3 mo.  Also she can try cutting her cholesterol med in half to see how her numbers do by then, her trig are still high but her chol is low. Cont to avoid trans/sat fats.  Kidney tests and electrolytes look good otherwise.

## 2018-11-17 NOTE — Progress Notes (Signed)
I have called and talked to this patient and given her the lab results per SJenkins, PA-C's review.  Patient verbalizes understanding.

## 2018-11-19 LAB — METABOLIC PANEL, BASIC
BUN/Creatinine ratio: 20 (ref 12–28)
BUN: 16 mg/dL (ref 8–27)
CO2: 21 mmol/L (ref 20–29)
Calcium: 10 mg/dL (ref 8.7–10.3)
Chloride: 99 mmol/L (ref 96–106)
Creatinine: 0.79 mg/dL (ref 0.57–1.00)
GFR est AA: 90 mL/min/{1.73_m2} (ref >59)
GFR est non-AA: 78 mL/min/{1.73_m2} (ref >59)
Glucose: 212 mg/dL — ABNORMAL HIGH (ref 65–99)
Potassium: 4.1 mmol/L (ref 3.5–5.2)
Sodium: 139 mmol/L (ref 134–144)

## 2018-11-19 LAB — LIPID PANEL
Cholesterol, Total: 115 mg/dL (ref 100–199)
Cholesterol, total: 115 mg/dL (ref 100–199)
HDL Cholesterol: 29 mg/dL — ABNORMAL LOW (ref >39)
HDL: 29 mg/dL — ABNORMAL LOW (ref >39)
LDL Calculated: 47 mg/dL (ref 0–99)
LDL, calculated: 47 mg/dL (ref 0–99)
Triglyceride: 197 mg/dL — ABNORMAL HIGH (ref 0–149)
Triglycerides: 197 mg/dL — ABNORMAL HIGH (ref 0–149)
VLDL Cholesterol Calculated: 39 mg/dL (ref 5–40)
VLDL, calculated: 39 mg/dL (ref 5–40)

## 2018-11-19 LAB — HEMOGLOBIN A1C WITH EAG
Estimated average glucose: 189 mg/dL
Hemoglobin A1c: 8.2 % — ABNORMAL HIGH (ref 4.8–5.6)

## 2018-11-19 LAB — CVD REPORT

## 2018-11-19 LAB — BASIC METABOLIC PANEL
BUN: 16 mg/dL (ref 8–27)
Bun/Cre Ratio: 20 NA (ref 12–28)
CO2: 21 mmol/L (ref 20–29)
Calcium: 10 mg/dL (ref 8.7–10.3)
Chloride: 99 mmol/L (ref 96–106)
Creatinine: 0.79 mg/dL (ref 0.57–1.00)
EGFR IF NonAfrican American: 78 mL/min/{1.73_m2} (ref >59)
GFR African American: 90 mL/min/{1.73_m2} (ref >59)
Glucose: 212 mg/dL — ABNORMAL HIGH (ref 65–99)
Potassium: 4.1 mmol/L (ref 3.5–5.2)
Sodium: 139 mmol/L (ref 134–144)

## 2018-11-19 LAB — HEMOGLOBIN A1C W/EAG
Hemoglobin A1C: 8.2 % — ABNORMAL HIGH (ref 4.8–5.6)
eAG: 189 mg/dL

## 2018-11-20 MED ORDER — SITAGLIPTIN 25 MG TAB
25 mg | ORAL_TABLET | Freq: Every day | ORAL | 2 refills | Status: DC
Start: 2018-11-20 — End: 2018-11-24

## 2018-11-21 ENCOUNTER — Telehealth

## 2018-11-21 NOTE — Telephone Encounter (Signed)
Pt is calling back to have questions about her labs and new rx answered. Please call her

## 2018-11-21 NOTE — Telephone Encounter (Signed)
I have called and talked to this patient.  She is concerned about the cost of Jardiance that was sent over for her to try.  She can not afford the medicine.  She wonders if she can increase her Metformin and take 2 in the AM and 2 in the PM.  Or, can you send in an RX for something else.

## 2018-11-21 NOTE — Telephone Encounter (Signed)
Call from patient. States she has not been called with her lab results, but her pharmacy called and said Maralyn Sago had prescribed Januvia and it will cost her $468. She cannot afford this and does not understand why this was called in.She states she takes Metformin and only pay .58 cents per bottle. Please advise. [phone]

## 2018-11-24 MED ORDER — DAPAGLIFLOZIN 5 MG TABLET
5 mg | ORAL_TABLET | Freq: Every day | ORAL | 2 refills | Status: DC
Start: 2018-11-24 — End: 2019-01-19

## 2018-11-24 NOTE — Telephone Encounter (Signed)
Please tell pt unfortunately she is already at max dosage of metformin (2500mg  daily).  I am going to try to send her another medication that she could get down to $0 copay I fshe registers online. Let me know if she has any issues. The medication is [place] brand (dapagliflozin generic). She can either call [phone] or go to [email or web address]

## 2018-11-24 NOTE — Telephone Encounter (Signed)
Does she continue taking the metformin as directed?

## 2019-01-19 ENCOUNTER — Encounter

## 2019-01-19 MED ORDER — DAPAGLIFLOZIN 5 MG TABLET
5 mg | ORAL_TABLET | Freq: Every day | ORAL | 2 refills | Status: DC
Start: 2019-01-19 — End: 2019-01-21

## 2019-01-19 NOTE — Telephone Encounter (Signed)
Requested Prescriptions     Pending Prescriptions Disp Refills   ??? dapagliflozin (FARXIGA) 5 mg tab tablet 30 Tab 2     Sig: Take 1 Tab by mouth daily (with breakfast).     Please send 90 day supply.

## 2019-01-21 ENCOUNTER — Telehealth

## 2019-01-21 MED ORDER — DAPAGLIFLOZIN 5 MG TABLET
5 mg | ORAL_TABLET | Freq: Every day | ORAL | 0 refills | Status: DC
Start: 2019-01-21 — End: 2019-07-23

## 2019-01-21 NOTE — Telephone Encounter (Signed)
Done. Pt must f/u in office for labs prior to running out of this 90 day Rx.

## 2019-01-21 NOTE — Telephone Encounter (Signed)
Please change Farxiga 5 mg to 90 day supply.

## 2019-01-29 ENCOUNTER — Encounter

## 2019-01-29 MED ORDER — ATORVASTATIN 80 MG TAB
80 mg | ORAL_TABLET | ORAL | 0 refills | Status: DC
Start: 2019-01-29 — End: 2019-05-04

## 2019-01-29 MED ORDER — FLUOXETINE 20 MG CAP
20 mg | ORAL_CAPSULE | ORAL | 0 refills | Status: DC
Start: 2019-01-29 — End: 2019-05-04

## 2019-01-29 MED ORDER — LEVOTHYROXINE 50 MCG TAB
50 mcg | ORAL_TABLET | ORAL | 0 refills | Status: DC
Start: 2019-01-29 — End: 2019-05-04

## 2019-01-29 NOTE — Telephone Encounter (Signed)
Pt due for f/u with labs next month, may do as VV and order labs if possible.

## 2019-03-18 ENCOUNTER — Encounter

## 2019-03-18 MED ORDER — HYDROCHLOROTHIAZIDE 12.5 MG TAB
12.5 mg | ORAL_TABLET | ORAL | 2 refills | Status: DC
Start: 2019-03-18 — End: 2019-03-24

## 2019-03-18 MED ORDER — LOSARTAN 25 MG TAB
25 mg | ORAL_TABLET | ORAL | 2 refills | Status: DC
Start: 2019-03-18 — End: 2019-03-24

## 2019-03-18 NOTE — Telephone Encounter (Signed)
Requested Prescriptions     Pending Prescriptions Disp Refills   ??? hydroCHLOROthiazide (HYDRODIURIL) 12.5 mg tablet 30 Tab 5   ??? losartan (COZAAR) 25 mg tablet 30 Tab 5

## 2019-03-24 ENCOUNTER — Encounter

## 2019-03-24 MED ORDER — HYDROCHLOROTHIAZIDE 12.5 MG TAB
12.5 mg | ORAL_TABLET | ORAL | 2 refills | Status: DC
Start: 2019-03-24 — End: 2019-07-23

## 2019-03-24 MED ORDER — LOSARTAN 25 MG TAB
25 mg | ORAL_TABLET | ORAL | 2 refills | Status: DC
Start: 2019-03-24 — End: 2019-07-23

## 2019-03-24 NOTE — Telephone Encounter (Signed)
Requested Prescriptions     Pending Prescriptions Disp Refills   ??? losartan (COZAAR) 25 mg tablet 30 Tab 2     Sig: TAKE 1 TABLET BY MOUTH EVERY DAY   ??? hydroCHLOROthiazide (HYDRODIURIL) 12.5 mg tablet 30 Tab 2     Sig: TAKE 1 TABLET BY MOUTH EVERY DAY

## 2019-03-24 NOTE — Telephone Encounter (Signed)
Keep f/u in Sept

## 2019-05-03 ENCOUNTER — Encounter

## 2019-05-04 MED ORDER — ATORVASTATIN 80 MG TAB
80 mg | ORAL_TABLET | ORAL | 0 refills | Status: DC
Start: 2019-05-04 — End: 2019-07-23

## 2019-05-04 MED ORDER — FLUOXETINE 20 MG CAP
20 mg | ORAL_CAPSULE | ORAL | 0 refills | Status: DC
Start: 2019-05-04 — End: 2019-07-23

## 2019-05-04 MED ORDER — LEVOTHYROXINE 50 MCG TAB
50 mcg | ORAL_TABLET | ORAL | 0 refills | Status: DC
Start: 2019-05-04 — End: 2019-07-23

## 2019-07-21 NOTE — Telephone Encounter (Signed)
Noted. Thank you.

## 2019-07-21 NOTE — Telephone Encounter (Signed)
Patient returns my call.  I have comfirmed with her that she is taking Losartan, not Candesartan.  Patient will call back to make an apt for a visit and flu shot.

## 2019-07-23 ENCOUNTER — Ambulatory Visit: Attending: Physician Assistant | Primary: Physician Assistant

## 2019-07-23 ENCOUNTER — Ambulatory Visit: Admit: 2019-07-23 | Discharge: 2019-07-23 | Attending: Physician Assistant | Primary: Physician Assistant

## 2019-07-23 DIAGNOSIS — E1121 Type 2 diabetes mellitus with diabetic nephropathy: Secondary | ICD-10-CM

## 2019-07-23 MED ORDER — LEVOTHYROXINE 50 MCG TAB
50 mcg | ORAL_TABLET | Freq: Every day | ORAL | 1 refills | Status: DC
Start: 2019-07-23 — End: 2020-02-18

## 2019-07-23 MED ORDER — FLUOXETINE 20 MG CAP
20 mg | ORAL_CAPSULE | Freq: Two times a day (BID) | ORAL | 1 refills | Status: DC
Start: 2019-07-23 — End: 2020-02-18

## 2019-07-23 MED ORDER — ATORVASTATIN 80 MG TAB
80 mg | ORAL_TABLET | Freq: Every day | ORAL | 1 refills | Status: DC
Start: 2019-07-23 — End: 2020-02-18

## 2019-07-23 MED ORDER — HYDROCHLOROTHIAZIDE 12.5 MG TAB
12.5 mg | ORAL_TABLET | ORAL | 1 refills | Status: DC
Start: 2019-07-23 — End: 2020-02-23

## 2019-07-23 MED ORDER — LOSARTAN 25 MG TAB
25 mg | ORAL_TABLET | ORAL | 1 refills | Status: DC
Start: 2019-07-23 — End: 2020-02-23

## 2019-07-23 MED ORDER — FLUOXETINE 20 MG CAP
20 mg | ORAL_CAPSULE | Freq: Every day | ORAL | 1 refills | Status: DC
Start: 2019-07-23 — End: 2019-07-23

## 2019-07-23 MED ORDER — METFORMIN 1,000 MG TAB
1000 mg | ORAL_TABLET | ORAL | 1 refills | Status: DC
Start: 2019-07-23 — End: 2020-02-03

## 2019-07-23 MED ORDER — METOPROLOL SUCCINATE SR 50 MG 24 HR TAB
50 mg | ORAL_TABLET | Freq: Every day | ORAL | 1 refills | Status: DC
Start: 2019-07-23 — End: 2020-02-18

## 2019-07-23 NOTE — Progress Notes (Signed)
The patient calls to get her lab results.  I have given her the results and recommendations from [place], PA-C.  Patient verbalizes she understands.  She does report that she did attempt to pick up the Januvia, but it will cost her $400.00 and she can not afford that.  I have asked her to call her Insurance company to find out what they will cover for medication that works like th Januvia.  In the mean time the patient asks if she can just increase her Metformin instead of getting a new medication?

## 2019-07-23 NOTE — Progress Notes (Signed)
Patient did return my call, her Insurance copy says she could try Nesina Tabs 25 mg for $ 38 a month or Onglyza 5 mg for $ 42 a month.

## 2019-07-23 NOTE — Addendum Note (Signed)
Addendum Note by Marlan Palau, PA-C at 07/23/19 1100                Author: Marlan Palau, PA-C  Service: --  Author Type: Physician Assistant       Filed: 07/23/19 1654  Encounter Date: 07/23/2019  Status: Signed          Editor: Jeanmarie Hubert (Physician Assistant)          Addended by: Marlan Palau on: 07/23/2019 04:54 PM    Modules accepted: Orders

## 2019-07-23 NOTE — Progress Notes (Signed)
I have called and left a message for this patient to return my call.

## 2019-07-23 NOTE — Progress Notes (Signed)
Notify pt a1c is not much better. I am going to try to send another medication and if it is not cost effective she needs to let us know asap. It may also be helpful if she gets info from her insurance company on their preferred oral diabetes medications aside from metformin and glipizide (or similar) and let us know.  Her trig are also higher and her wbc is borderline. She should let me know if she starts having any sxs of infection, otherwise she can have this rechecked in a few weeks.  Work on diet, low in trans/sat fats, low conc sweets/carbs/starches.  a1c recheck in 3 mo

## 2019-07-23 NOTE — Progress Notes (Signed)
1. Have you been to the ER, urgent care clinic since your last visit?  Hospitalized since your last visit?No    2. Have you seen or consulted any other health care providers outside of the [facility] since your last visit?  Include any pap smears or colon screening. No

## 2019-07-23 NOTE — Addendum Note (Signed)
Addendum Note by Marlan Palau, PA-C at 07/23/19 1100                Author: Marlan Palau, PA-C  Service: --  Author Type: Physician Assistant       Filed: 07/29/19 1200  Encounter Date: 07/23/2019  Status: Signed          Editor: Jeanmarie Hubert (Physician Assistant)          Addended by: Marlan Palau on: 07/29/2019 12:00 PM    Modules accepted: Orders

## 2019-07-23 NOTE — Progress Notes (Signed)
I have called and talked to this patient.  I have given her the instructions from [place], PA-C.  Patient verbalizes understanding.

## 2019-07-23 NOTE — Addendum Note (Signed)
Addendum Note by Marlan Palau, PA-C at 07/23/19 1100                Author: Marlan Palau, PA-C  Service: --  Author Type: Physician Assistant       Filed: 07/27/19 1426  Encounter Date: 07/23/2019  Status: Signed          Editor: Jeanmarie Hubert (Physician Assistant)          Addended by: Marlan Palau on: 07/27/2019 02:26 PM    Modules accepted: Orders

## 2019-07-23 NOTE — Progress Notes (Signed)
Madison Peters is a 68 y.o. female who presents to the office today with the following:  Chief Complaint   Patient presents with   ??? Diabetes     f/u with refills       HPI  Here for routine f/u.  Is fasting for labs.    HTN  BP well-controlled on current regimen.  No SEs with meds.  Checks at home and "good" readings, similar to here.  Pt reports she has been taking Metoprolol 50 mg once daily in addn to her HCTZ.  Was not on her med list.    T2DM  Lab Results   Component Value Date/Time    Hemoglobin A1c 8.2 (H) 11/17/2018 08:41 AM    Hemoglobin A1c (POC) 7.1 05/14/2018 08:58 AM   pt could not afford [name] so never picked up.    Hypothyroidism  Lab Results   Component Value Date/Time    TSH 1.600 08/13/2018 08:39 AM   asxs on levothyroxine 50 mcg.    Hyperlipidemia  Lab Results   Component Value Date/Time    Cholesterol, total 115 11/17/2018 08:41 AM    HDL Cholesterol 29 (L) 11/17/2018 08:41 AM    LDL, calculated 47 11/17/2018 08:41 AM    VLDL, calculated 39 11/17/2018 08:41 AM    Triglyceride 197 (H) 11/17/2018 08:41 AM     Never did cut back statin, still on 80 mg atorvastatin  Reports anxiety well-controlled on fluoxetine.  Denies any depression or other psych sxs.  No med SEs.    Would like Flu and Shingrix today.  Pt prefers FOBT over colonoscopy.  Pt unsure if has had DEXA but is not interested in this at this time.  Cut back to 7 cig a day. Continues to work on her smoking cessation.    ROS  See HPI.    Past Medical History:   Diagnosis Date   ??? Anxiety    ??? Diabetes ([place])    ??? Dyspepsia    ??? Emphysema    ??? Headache(784.0)    ??? Hypertension    ??? IGT (impaired glucose tolerance)    ??? Kidney stones    ??? Lipoma     Left calf   ??? Thyroid disease     hypothryoidism       Past Surgical History:   Procedure Laterality Date   ??? HX HEENT      Tonsillectomy at age 60       Allergies   Allergen Reactions   ??? Ace Inhibitors Cough   ??? Amoxicillin Nausea and Vomiting   ??? Erythromycin Other (comments)     Does not  remember.   ??? Sulfa (Sulfonamide Antibiotics) Nausea and Vomiting       Current Outpatient Medications   Medication Sig   ??? Omeprazole delayed release (PRILOSEC D/R) 20 mg tablet Take 20 mg by mouth daily.   ??? cholecalciferol, vitamin D3, 50 mcg (2,000 unit) tab Take  by mouth. Taking 1011m   ??? ascorbic acid, vitamin C, (Vitamin C) 500 mg tablet Take  by mouth.   ??? metoprolol succinate (TOPROL-XL) 50 mg XL tablet Take 1 Tab by mouth daily.   ??? atorvastatin (LIPITOR) 80 mg tablet Take 1 Tab by mouth daily.   ??? FLUoxetine (PROzac) 20 mg capsule Take 1 Cap by mouth daily.   ??? hydroCHLOROthiazide (HYDRODIURIL) 12.5 mg tablet TAKE 1 TABLET BY MOUTH EVERY DAY   ??? levothyroxine (SYNTHROID) 50 mcg tablet Take 1 Tab by mouth  daily.   ??? losartan (COZAAR) 25 mg tablet TAKE 1 TABLET BY MOUTH EVERY DAY   ??? metFORMIN (GLUCOPHAGE) 1,000 mg tablet TAKE 1&1/2 TABLETS BY MOUTH EVERY MORNING AND 1 TABLET IN THE EVENING WITH A MEAL   ??? Blood-Glucose Meter monitoring kit accucheck meter   ??? glucose blood VI test strips (ACCU-CHEK AVIVA PLUS TEST STRP) strip accucheck strips   ??? lancets misc Use for once daily BS checks   ??? multivitamin (ONE A DAY) tablet Take 1 Tab by mouth daily.   ??? acetaminophen (TYLENOL) 500 mg tablet Take  by mouth every six (6) hours as needed for Pain.   ??? omega-3 fatty acids-vitamin e (FISH OIL) 1,000 mg cap Take 1 Cap by mouth four (4) times daily.   ??? aspirin delayed-release 81 mg tablet Take  by mouth daily.   ??? OTHER Taking a pro biotic Queen V daily     No current facility-administered medications for this visit.        Social History     Socioeconomic History   ??? Marital status: MARRIED     Spouse name: Not on file   ??? Number of children: Not on file   ??? Years of education: Not on file   ??? Highest education level: Not on file   Tobacco Use   ??? Smoking status: Light Tobacco Smoker     Packs/day: 1.00     Years: 50.00     Pack years: 50.00     Last attempt to quit: 01/28/2018     Years since quitting: 1.4    ??? Smokeless tobacco: Never Used   ??? Tobacco comment: started back again after quitting for a few months   Substance and Sexual Activity   ??? Alcohol use: No   ??? Drug use: No   ??? Sexual activity: Yes     Partners: Male       Family History   Problem Relation Age of Onset   ??? Heart Disease Mother    ??? Other Father         AAA         Physical Exam:  Visit Vitals  BP 108/62 (BP 1 Location: Left arm, BP Patient Position: Sitting)   Pulse 80   Temp 98.1 ??F (36.7 ??C) (Oral)   Resp 16   Ht '5\' 7"'  (1.702 m)   Wt 163 lb 3.2 oz (74 kg)   SpO2 97%   BMI 25.56 kg/m??     Physical Exam  Vitals signs and nursing note reviewed.   Constitutional:       Appearance: Normal appearance.   HENT:      Head: Normocephalic and atraumatic.   Eyes:      Conjunctiva/sclera: Conjunctivae normal.   Neck:      Musculoskeletal: Neck supple.   Cardiovascular:      Rate and Rhythm: Normal rate and regular rhythm.      Pulses: Normal pulses.      Heart sounds: Normal heart sounds.   Pulmonary:      Effort: Pulmonary effort is normal.      Breath sounds: Normal breath sounds.      Comments: Faint intermittent wheeze  Musculoskeletal:         General: No swelling.   Skin:     General: Skin is warm and dry.   Neurological:      Mental Status: She is alert and oriented to person, place, and time.   Psychiatric:  Mood and Affect: Mood normal.         Assessment/Plan:    ICD-10-CM ICD-9-CM    1. Type 2 diabetes with nephropathy (HCC)  E11.21 250.40 HEMOGLOBIN A1C WITH EAG     583.81 metFORMIN (GLUCOPHAGE) 1,000 mg tablet      PR HANDLG&/OR CONVEY OF SPEC FOR TR OFFICE TO LAB      COLLECTION VENOUS BLOOD,VENIPUNCTURE   2. Essential hypertension  I10 401.9 CBC WITH AUTOMATED DIFF      MICROALBUMIN, UR, RAND W/ MICROALB/CREAT RATIO      METABOLIC PANEL, COMPREHENSIVE      metoprolol succinate (TOPROL-XL) 50 mg XL tablet      hydroCHLOROthiazide (HYDRODIURIL) 12.5 mg tablet      losartan (COZAAR) 25 mg tablet   3. Hyperlipidemia, unspecified  hyperlipidemia type  E78.5 272.4 LIPID PANEL      atorvastatin (LIPITOR) 80 mg tablet   4. Thyroid disease  E07.9 246.9 TSH 3RD GENERATION      levothyroxine (SYNTHROID) 50 mcg tablet   5. Anxiety  F41.9 300.00 FLUoxetine (PROzac) 20 mg capsule   6. Encounter for immunization  Z23 V03.89 FLU (FLUAD QUAD INFLUENZA VACCINE,QUAD,ADJUVANTED)      ZOSTER VACC RECOMBINANT ADJUVANTED   7. Colon cancer screening  Z12.11 V76.51 OCCULT BLOOD IMMUNOASSAY,DIAGNOSTIC     will notify of lab results.  May need pt to contact insurance for [place] med formulary info to find more cost effective options.  Praised smoking cessation efforts, pt will continue to cut back and try to quit.  Otherwise doing well. Continue LMs and routine f/u.    Follow-up and Dispositions    ?? Return in about 6 months (around 01/20/2020), or sooner prn, or otherwise instructed pending results.       Seek care in interim for any new sxs or other concerns.  Pt verbalizes understanding and agrees with the plan.    Juanna Cao, PA-C

## 2019-07-23 NOTE — Addendum Note (Signed)
Addended by: Marlan Palau on: 07/27/2019 02:26 PM     Modules accepted: Orders

## 2019-07-23 NOTE — Progress Notes (Signed)
I have called and talked to this patient.  I have given her the instructions from SJen[place], PA-C.  Patient verbalizes understanding.

## 2019-07-23 NOTE — Progress Notes (Signed)
Patient did return my call, her Insurance copy says she could try Nesina Tabs 25 mg for $ 38 a month or Onglyza 5 mg for $ 42 a month.

## 2019-07-23 NOTE — Progress Notes (Signed)
Madison Peters is a 68 y.o. female who presents to the office today with the following:  Chief Complaint   Patient presents with   ??? Diabetes     f/u with refills       HPI  Here for routine f/u.  Is fasting for labs.    HTN  BP well-controlled on current regimen.  No SEs with meds.  Checks at home and "good" readings, similar to here.  Pt reports she has been taking Metoprolol 50 mg once daily in addn to her HCTZ.  Was not on her med list.    T2DM  Lab Results   Component Value Date/Time    Hemoglobin A1c 8.2 (H) 11/17/2018 08:41 AM    Hemoglobin A1c (POC) 7.1 05/14/2018 08:58 AM   pt could not afford [name] so never picked up.    Hypothyroidism  Lab Results   Component Value Date/Time    TSH 1.600 08/13/2018 08:39 AM   asxs on levothyroxine 50 mcg.    Hyperlipidemia  Lab Results   Component Value Date/Time    Cholesterol, total 115 11/17/2018 08:41 AM    HDL Cholesterol 29 (L) 11/17/2018 08:41 AM    LDL, calculated 47 11/17/2018 08:41 AM    VLDL, calculated 39 11/17/2018 08:41 AM    Triglyceride 197 (H) 11/17/2018 08:41 AM     Never did cut back statin, still on 80 mg atorvastatin  Reports anxiety well-controlled on fluoxetine.  Denies any depression or other psych sxs.  No med SEs.    Would like Flu and Shingrix today.  Pt prefers FOBT over colonoscopy.  Pt unsure if has had DEXA but is not interested in this at this time.  Cut back to 7 cig a day. Continues to work on her smoking cessation.    ROS  See HPI.    Past Medical History:   Diagnosis Date   ??? Anxiety    ??? Diabetes ([place])    ??? Dyspepsia    ??? Emphysema    ??? Headache(784.0)    ??? Hypertension    ??? IGT (impaired glucose tolerance)    ??? Kidney stones    ??? Lipoma     Left calf   ??? Thyroid disease     hypothryoidism       Past Surgical History:   Procedure Laterality Date   ??? HX HEENT      Tonsillectomy at age 8       Allergies   Allergen Reactions   ??? Ace Inhibitors Cough   ??? Amoxicillin Nausea and Vomiting   ??? Erythromycin Other (comments)      Does not remember.   ??? Sulfa (Sulfonamide Antibiotics) Nausea and Vomiting       Current Outpatient Medications   Medication Sig   ??? Omeprazole delayed release (PRILOSEC D/R) 20 mg tablet Take 20 mg by mouth daily.   ??? cholecalciferol, vitamin D3, 50 mcg (2,000 unit) tab Take  by mouth. Taking 1082m   ??? ascorbic acid, vitamin C, (Vitamin C) 500 mg tablet Take  by mouth.   ??? metoprolol succinate (TOPROL-XL) 50 mg XL tablet Take 1 Tab by mouth daily.   ??? atorvastatin (LIPITOR) 80 mg tablet Take 1 Tab by mouth daily.   ??? FLUoxetine (PROzac) 20 mg capsule Take 1 Cap by mouth daily.   ??? hydroCHLOROthiazide (HYDRODIURIL) 12.5 mg tablet TAKE 1 TABLET BY MOUTH EVERY DAY   ??? levothyroxine (SYNTHROID) 50 mcg tablet Take 1 Tab by mouth  daily.   ??? losartan (COZAAR) 25 mg tablet TAKE 1 TABLET BY MOUTH EVERY DAY   ??? metFORMIN (GLUCOPHAGE) 1,000 mg tablet TAKE 1&1/2 TABLETS BY MOUTH EVERY MORNING AND 1 TABLET IN THE EVENING WITH A MEAL   ??? Blood-Glucose Meter monitoring kit accucheck meter   ??? glucose blood VI test strips (ACCU-CHEK AVIVA PLUS TEST STRP) strip accucheck strips   ??? lancets misc Use for once daily BS checks   ??? multivitamin (ONE A DAY) tablet Take 1 Tab by mouth daily.   ??? acetaminophen (TYLENOL) 500 mg tablet Take  by mouth every six (6) hours as needed for Pain.   ??? omega-3 fatty acids-vitamin e (FISH OIL) 1,000 mg cap Take 1 Cap by mouth four (4) times daily.   ??? aspirin delayed-release 81 mg tablet Take  by mouth daily.   ??? OTHER Taking a pro biotic Queen V daily     No current facility-administered medications for this visit.        Social History     Socioeconomic History   ??? Marital status: MARRIED     Spouse name: Not on file   ??? Number of children: Not on file   ??? Years of education: Not on file   ??? Highest education level: Not on file   Tobacco Use   ??? Smoking status: Light Tobacco Smoker     Packs/day: 1.00     Years: 50.00     Pack years: 50.00     Last attempt to quit: 01/28/2018      Years since quitting: 1.4   ??? Smokeless tobacco: Never Used   ??? Tobacco comment: started back again after quitting for a few months   Substance and Sexual Activity   ??? Alcohol use: No   ??? Drug use: No   ??? Sexual activity: Yes     Partners: Male       Family History   Problem Relation Age of Onset   ??? Heart Disease Mother    ??? Other Father         AAA         Physical Exam:  Visit Vitals  BP 108/62 (BP 1 Location: Left arm, BP Patient Position: Sitting)   Pulse 80   Temp 98.1 ??F (36.7 ??C) (Oral)   Resp 16   Ht 5' 7"  (1.702 m)   Wt 163 lb 3.2 oz (74 kg)   SpO2 97%   BMI 25.56 kg/m??     Physical Exam  Vitals signs and nursing note reviewed.   Constitutional:       Appearance: Normal appearance.   HENT:      Head: Normocephalic and atraumatic.   Eyes:      Conjunctiva/sclera: Conjunctivae normal.   Neck:      Musculoskeletal: Neck supple.   Cardiovascular:      Rate and Rhythm: Normal rate and regular rhythm.      Pulses: Normal pulses.      Heart sounds: Normal heart sounds.   Pulmonary:      Effort: Pulmonary effort is normal.      Breath sounds: Normal breath sounds.      Comments: Faint intermittent wheeze  Musculoskeletal:         General: No swelling.   Skin:     General: Skin is warm and dry.   Neurological:      Mental Status: She is alert and oriented to person, place, and time.   Psychiatric:  Mood and Affect: Mood normal.         Assessment/Plan:    ICD-10-CM ICD-9-CM    1. Type 2 diabetes with nephropathy (HCC)  E11.21 250.40 HEMOGLOBIN A1C WITH EAG     583.81 metFORMIN (GLUCOPHAGE) 1,000 mg tablet      PR HANDLG&/OR CONVEY OF SPEC FOR TR OFFICE TO LAB      COLLECTION VENOUS BLOOD,VENIPUNCTURE   2. Essential hypertension  I10 401.9 CBC WITH AUTOMATED DIFF      MICROALBUMIN, UR, RAND W/ MICROALB/CREAT RATIO      METABOLIC PANEL, COMPREHENSIVE      metoprolol succinate (TOPROL-XL) 50 mg XL tablet      hydroCHLOROthiazide (HYDRODIURIL) 12.5 mg tablet      losartan (COZAAR) 25 mg tablet    3. Hyperlipidemia, unspecified hyperlipidemia type  E78.5 272.4 LIPID PANEL      atorvastatin (LIPITOR) 80 mg tablet   4. Thyroid disease  E07.9 246.9 TSH 3RD GENERATION      levothyroxine (SYNTHROID) 50 mcg tablet   5. Anxiety  F41.9 300.00 FLUoxetine (PROzac) 20 mg capsule   6. Encounter for immunization  Z23 V03.89 FLU (FLUAD QUAD INFLUENZA VACCINE,QUAD,ADJUVANTED)      ZOSTER VACC RECOMBINANT ADJUVANTED   7. Colon cancer screening  Z12.11 V76.51 OCCULT BLOOD IMMUNOASSAY,DIAGNOSTIC     will notify of lab results.  May need pt to contact insurance for DM med formulary info to find more cost effective options.  Praised smoking cessation efforts, pt will continue to cut back and try to quit.  Otherwise doing well. Continue LMs and routine f/u.    Follow-up and Dispositions    ?? Return in about 6 months (around 01/20/2020), or sooner prn, or otherwise instructed pending results.       Seek care in interim for any new sxs or other concerns.  Pt verbalizes understanding and agrees with the plan.    Juanna Cao, PA-C

## 2019-07-23 NOTE — Progress Notes (Signed)
The patient calls to get her lab results.  I have given her the results and recommendations from SJenkins, PA-C.  Patient verbalizes she understands.  She does report that she did attempt to pick up the Januvia, but it will cost her $400.00 and she can not afford that.  I have asked her to call her Insurance company to find out what they will cover for medication that works like th Januvia.  In the mean time the patient asks if she can just increase her Metformin instead of getting a new medication?

## 2019-07-23 NOTE — Addendum Note (Signed)
Addended by: Marlan Palau on: 07/29/2019 12:00 PM     Modules accepted: Orders

## 2019-07-23 NOTE — Addendum Note (Signed)
Addended by: Marlan Palau on: 07/23/2019 04:54 PM     Modules accepted: Orders

## 2019-07-23 NOTE — Progress Notes (Signed)
1. Have you been to the ER, urgent care clinic since your last visit?  Hospitalized since your last visit?No    2. Have you seen or consulted any other health care providers outside of the [facility] since your last visit?  Include any pap smears or colon screening. No

## 2019-07-23 NOTE — Progress Notes (Signed)
Notify pt a1c is not much better. I am going to try to send another medication and if it is not cost effective she needs to let us know asap. It may also be helpful if she gets info from her insurance company on their preferred oral diabetes medications aside from metformin and glipizide (or similar) and let us know.  Her trig are also higher and her wbc is borderline. She should let me know if she starts having any sxs of infection, otherwise she can have this rechecked in a few weeks.  Work on diet, low in trans/sat fats, low conc sweets/carbs/starches.  a1c recheck in 3 mo

## 2019-07-24 LAB — CBC WITH AUTOMATED DIFF
ABS. BASOPHILS: 0.2 10*3/uL (ref 0.0–0.2)
ABS. EOSINOPHILS: 0.2 10*3/uL (ref 0.0–0.4)
ABS. IMM. GRANS.: 0 10*3/uL (ref 0.0–0.1)
ABS. MONOCYTES: 0.8 10*3/uL (ref 0.1–0.9)
ABS. NEUTROPHILS: 7.3 10*3/uL — ABNORMAL HIGH (ref 1.4–7.0)
Abs Lymphocytes: 2.5 10*3/uL (ref 0.7–3.1)
BASOPHILS: 2 %
EOSINOPHILS: 2 %
HCT: 43.1 % (ref 34.0–46.6)
HGB: 15.2 g/dL (ref 11.1–15.9)
IMMATURE GRANULOCYTES: 0 %
Lymphocytes: 23 %
MCH: 31.5 pg (ref 26.6–33.0)
MCHC: 35.3 g/dL (ref 31.5–35.7)
MCV: 89 fL (ref 79–97)
MONOCYTES: 7 %
NEUTROPHILS: 66 %
PLATELET: 335 10*3/uL (ref 150–450)
RBC: 4.83 x10E6/uL (ref 3.77–5.28)
RDW: 12.8 % (ref 11.7–15.4)
WBC: 11 10*3/uL — ABNORMAL HIGH (ref 3.4–10.8)

## 2019-07-24 LAB — METABOLIC PANEL, COMPREHENSIVE
A-G Ratio: 2.6 — ABNORMAL HIGH (ref 1.2–2.2)
ALT (SGPT): 14 IU/L (ref 0–32)
AST (SGOT): 15 IU/L (ref 0–40)
Albumin: 5 g/dL — ABNORMAL HIGH (ref 3.8–4.8)
Alk. phosphatase: 76 IU/L (ref 39–117)
BUN/Creatinine ratio: 22 (ref 12–28)
BUN: 14 mg/dL (ref 8–27)
Bilirubin, total: 0.4 mg/dL (ref 0.0–1.2)
CO2: 21 mmol/L (ref 20–29)
Calcium: 9.5 mg/dL (ref 8.7–10.3)
Chloride: 96 mmol/L (ref 96–106)
Creatinine: 0.64 mg/dL (ref 0.57–1.00)
GFR est AA: 107 mL/min/{1.73_m2} (ref >59)
GFR est non-AA: 93 mL/min/{1.73_m2} (ref >59)
GLOBULIN, TOTAL: 1.9 g/dL (ref 1.5–4.5)
Glucose: 132 mg/dL — ABNORMAL HIGH (ref 65–99)
Potassium: 4.1 mmol/L (ref 3.5–5.2)
Protein, total: 6.9 g/dL (ref 6.0–8.5)
Sodium: 136 mmol/L (ref 134–144)

## 2019-07-24 LAB — MICROALBUMIN, UR, RAND W/ MICROALB/CREAT RATIO
Creatinine, urine random: 126.7 mg/dL
Microalb/Creat ratio (ug/mg creat.): 25 mg/g creat (ref 0–29)
Microalbumin, urine: 31.1 ug/mL

## 2019-07-24 LAB — LIPID PANEL
Cholesterol, Total: 136 mg/dL (ref 100–199)
Cholesterol, total: 136 mg/dL (ref 100–199)
HDL Cholesterol: 33 mg/dL — ABNORMAL LOW (ref >39)
HDL: 33 mg/dL — ABNORMAL LOW (ref >39)
LDL Calculated: 60 mg/dL (ref 0–99)
LDL, calculated: 60 mg/dL (ref 0–99)
Triglyceride: 270 mg/dL — ABNORMAL HIGH (ref 0–149)
Triglycerides: 270 mg/dL — ABNORMAL HIGH (ref 0–149)
VLDL, calculated: 43 mg/dL — ABNORMAL HIGH (ref 5–40)
VLDL: 43 mg/dL — ABNORMAL HIGH (ref 5–40)

## 2019-07-24 LAB — HEMOGLOBIN A1C WITH EAG
Estimated average glucose: 180 mg/dL
Hemoglobin A1c: 7.9 % — ABNORMAL HIGH (ref 4.8–5.6)

## 2019-07-24 LAB — CVD REPORT

## 2019-07-24 LAB — TSH 3RD GENERATION
TSH: 1.59 u[IU]/mL (ref 0.450–4.500)
TSH: 1.59 u[IU]/mL (ref 0.450–4.500)

## 2019-07-24 LAB — CBC WITH AUTO DIFFERENTIAL
Basophils %: 2 %
Basophils Absolute: 0.2 10*3/uL (ref 0.0–0.2)
Eosinophils %: 2 %
Eosinophils Absolute: 0.2 10*3/uL (ref 0.0–0.4)
Granulocyte Absolute Count: 0 10*3/uL (ref 0.0–0.1)
Hematocrit: 43.1 % (ref 34.0–46.6)
Hemoglobin: 15.2 g/dL (ref 11.1–15.9)
Immature Granulocytes: 0 %
Lymphocytes %: 23 %
Lymphocytes Absolute: 2.5 10*3/uL (ref 0.7–3.1)
MCH: 31.5 pg (ref 26.6–33.0)
MCHC: 35.3 g/dL (ref 31.5–35.7)
MCV: 89 fL (ref 79–97)
Monocytes %: 7 %
Monocytes Absolute: 0.8 10*3/uL (ref 0.1–0.9)
Neutrophils %: 66 %
Neutrophils Absolute: 7.3 10*3/uL — ABNORMAL HIGH (ref 1.4–7.0)
Platelets: 335 10*3/uL (ref 150–450)
RBC: 4.83 x10E6/uL (ref 3.77–5.28)
RDW: 12.8 % (ref 11.7–15.4)
WBC: 11 10*3/uL — ABNORMAL HIGH (ref 3.4–10.8)

## 2019-07-24 LAB — COMPREHENSIVE METABOLIC PANEL
ALT: 14 IU/L (ref 0–32)
AST: 15 IU/L (ref 0–40)
Albumin/Globulin Ratio: 2.6 NA — ABNORMAL HIGH (ref 1.2–2.2)
Albumin: 5 g/dL — ABNORMAL HIGH (ref 3.8–4.8)
Alkaline Phosphatase: 76 IU/L (ref 39–117)
BUN: 14 mg/dL (ref 8–27)
Bun/Cre Ratio: 22 NA (ref 12–28)
CO2: 21 mmol/L (ref 20–29)
Calcium: 9.5 mg/dL (ref 8.7–10.3)
Chloride: 96 mmol/L (ref 96–106)
Creatinine: 0.64 mg/dL (ref 0.57–1.00)
EGFR IF NonAfrican American: 93 mL/min/{1.73_m2} (ref >59)
GFR African American: 107 mL/min/{1.73_m2} (ref >59)
Globulin, Total: 1.9 g/dL (ref 1.5–4.5)
Glucose: 132 mg/dL — ABNORMAL HIGH (ref 65–99)
Potassium: 4.1 mmol/L (ref 3.5–5.2)
Sodium: 136 mmol/L (ref 134–144)
Total Bilirubin: 0.4 mg/dL (ref 0.0–1.2)
Total Protein: 6.9 g/dL (ref 6.0–8.5)

## 2019-07-24 LAB — MICROALBUMIN / CREATININE URINE RATIO
Creatinine, Ur: 126.7 mg/dL
Microalb, Ur: 31.1 ug/mL
Microalbumin Creatinine Ratio: 25 mg/g creat (ref 0–29)

## 2019-07-24 LAB — HEMOGLOBIN A1C W/EAG
Hemoglobin A1C: 7.9 % — ABNORMAL HIGH (ref 4.8–5.6)
eAG: 180 mg/dL

## 2019-07-27 MED ORDER — SITAGLIPTIN 100 MG TAB
100 mg | ORAL_TABLET | Freq: Every day | ORAL | 0 refills | Status: DC
Start: 2019-07-27 — End: 2019-07-29

## 2019-07-29 MED ORDER — ALOGLIPTIN 25 MG TABLET
25 mg | ORAL_TABLET | Freq: Every day | ORAL | 2 refills | Status: DC
Start: 2019-07-29 — End: 2020-02-23

## 2019-08-03 ENCOUNTER — Telehealth

## 2019-08-03 NOTE — Telephone Encounter (Signed)
I have called this patient, she is not able to get the Aloglipton that SJenkins, PA-C ordered due to it needing a PA.  I have called her insurance company talked to a representative.  The preferred medication is Januvia, but the cost for the patient is $400.  I have initiated a PA via Cover My Meds for the Aloglipton.  I have advised patient of this.

## 2019-08-03 NOTE — Telephone Encounter (Signed)
Please call patient regarding her medication. [phone]

## 2019-08-04 MED ORDER — GLIPIZIDE 5 MG TAB
5 mg | ORAL_TABLET | Freq: Every day | ORAL | 2 refills | Status: DC
Start: 2019-08-04 — End: 2019-11-02

## 2019-08-04 NOTE — Telephone Encounter (Signed)
Please notify pt I have sent glipizide 5 mg to start once daily since cost has been an issue with the others. She should still plan to have her a1c rechecked at 3 mo mark after most recent lab.

## 2019-08-04 NOTE — Telephone Encounter (Signed)
I have called and talked to this patient and advised her of the Glipizide having been sent to her pharmacy  Patient verbalizes understanding.

## 2019-10-31 ENCOUNTER — Encounter

## 2019-11-02 MED ORDER — GLIPIZIDE 5 MG TAB
5 mg | ORAL_TABLET | ORAL | 0 refills | Status: DC
Start: 2019-11-02 — End: 2020-02-23

## 2020-02-02 ENCOUNTER — Encounter

## 2020-02-03 MED ORDER — METFORMIN 1,000 MG TAB
1000 mg | ORAL_TABLET | ORAL | 0 refills | Status: DC
Start: 2020-02-03 — End: 2020-02-18

## 2020-02-03 NOTE — Telephone Encounter (Signed)
Please notify pt she is overdue for f/u with labs (was suppose to return in Feb due to elev a1c)

## 2020-02-03 NOTE — Telephone Encounter (Signed)
Please notify pt she is overdue for f/u with labs (was suppose to return in Feb due to elev a1c)

## 2020-02-16 ENCOUNTER — Encounter

## 2020-02-18 MED ORDER — ATORVASTATIN 80 MG TAB
80 mg | ORAL_TABLET | ORAL | 0 refills | Status: DC
Start: 2020-02-18 — End: 2020-02-23

## 2020-02-18 MED ORDER — FLUOXETINE 20 MG CAP
20 mg | ORAL_CAPSULE | ORAL | 0 refills | Status: DC
Start: 2020-02-18 — End: 2020-02-23

## 2020-02-18 MED ORDER — LEVOTHYROXINE 50 MCG TAB
50 mcg | ORAL_TABLET | ORAL | 0 refills | Status: DC
Start: 2020-02-18 — End: 2020-02-23

## 2020-02-18 MED ORDER — METOPROLOL SUCCINATE SR 50 MG 24 HR TAB
50 mg | ORAL_TABLET | ORAL | 0 refills | Status: DC
Start: 2020-02-18 — End: 2020-02-23

## 2020-02-18 MED ORDER — METFORMIN 1,000 MG TAB
1000 mg | ORAL_TABLET | ORAL | 0 refills | Status: DC
Start: 2020-02-18 — End: 2020-02-23

## 2020-02-18 NOTE — Telephone Encounter (Signed)
Remind pt she is running out bc she is overdue for in office f/u with BW

## 2020-02-18 NOTE — Telephone Encounter (Signed)
 Caller's first and last name: Monger, Rewa L [[id number]]        Reason for call: RX Fill Request       Callback required yes/no and why: Yes       Best contact number(s): [phone]       Details to clarify the request: pt advised four rx scripts were received by pharmacy[id number] [place], [place] - [address] and would like the practice to contact and confirm the other four medications that were not received and have rx fill request submitted to have filled before the weekend

## 2020-02-23 ENCOUNTER — Ambulatory Visit: Attending: Physician Assistant | Primary: Physician Assistant

## 2020-02-23 ENCOUNTER — Ambulatory Visit
Admit: 2020-02-23 | Discharge: 2020-02-23 | Payer: BLUE CROSS/BLUE SHIELD | Attending: Physician Assistant | Primary: Physician Assistant

## 2020-02-23 DIAGNOSIS — I10 Essential (primary) hypertension: Secondary | ICD-10-CM

## 2020-02-23 MED ORDER — GLIPIZIDE 5 MG TAB
5 mg | ORAL_TABLET | ORAL | 1 refills | Status: DC
Start: 2020-02-23 — End: 2021-07-04

## 2020-02-23 MED ORDER — ATORVASTATIN 80 MG TAB
80 mg | ORAL_TABLET | ORAL | 1 refills | Status: DC
Start: 2020-02-23 — End: 2020-10-20

## 2020-02-23 MED ORDER — LEVOTHYROXINE 50 MCG TAB
50 mcg | ORAL_TABLET | ORAL | 1 refills | Status: DC
Start: 2020-02-23 — End: 2020-10-20

## 2020-02-23 MED ORDER — LOSARTAN 25 MG TAB
25 mg | ORAL_TABLET | ORAL | 1 refills | Status: DC
Start: 2020-02-23 — End: 2021-06-05

## 2020-02-23 MED ORDER — FLUOXETINE 20 MG CAP
20 mg | ORAL_CAPSULE | ORAL | 1 refills | Status: DC
Start: 2020-02-23 — End: 2020-10-20

## 2020-02-23 MED ORDER — METFORMIN 1,000 MG TAB
1000 mg | ORAL_TABLET | ORAL | 1 refills | Status: DC
Start: 2020-02-23 — End: 2020-08-08

## 2020-02-23 MED ORDER — ALOGLIPTIN 25 MG TABLET
25 mg | ORAL_TABLET | Freq: Every day | ORAL | 1 refills | Status: DC
Start: 2020-02-23 — End: 2020-06-02

## 2020-02-23 MED ORDER — METOPROLOL SUCCINATE SR 50 MG 24 HR TAB
50 mg | ORAL_TABLET | ORAL | 1 refills | Status: DC
Start: 2020-02-23 — End: 2020-10-20

## 2020-02-23 MED ORDER — HYDROCHLOROTHIAZIDE 12.5 MG TAB
12.5 mg | ORAL_TABLET | ORAL | 1 refills | Status: DC
Start: 2020-02-23 — End: 2021-06-05

## 2020-02-23 MED ORDER — VARENICLINE 0.5 MG (11)-1 MG (3X14) TABS IN A DOSE PACK
0.5 mg (11)- 1 mg (42) | ORAL | 0 refills | Status: DC
Start: 2020-02-23 — End: 2020-06-02

## 2020-02-23 NOTE — Progress Notes (Signed)
Madison Peters is a 69 y.o. female who presents to the office today with the following:  Chief Complaint   Patient presents with   ??? Follow-up       HPI   Pt presents for routine f/u, refills, and FBW.  She reports feeling well with no complaints or acute concerns.  She is due for her second Shingrix.     HTN  BP controlled.  No cardiac complaints.    DM  Ran out of new Rx of alogliptin (nesina) 25 mg and did not ask for refill or f/up for apt.  Was tolerating it well, took for 3 mo.  Lab Results   Component Value Date/Time    Hemoglobin A1c 7.9 (H) 07/23/2019 11:43 AM    Hemoglobin A1c (POC) 7.1 05/14/2018 08:58 AM       Lab Results   Component Value Date/Time    Cholesterol, total 136 07/23/2019 11:43 AM    HDL Cholesterol 33 (L) 07/23/2019 11:43 AM    LDL, calculated 60 07/23/2019 11:43 AM    LDL, calculated 47 11/17/2018 08:41 AM    VLDL, calculated 43 (H) 07/23/2019 11:43 AM    VLDL, calculated 39 11/17/2018 08:41 AM    Triglyceride 270 (H) 07/23/2019 11:43 AM   doing well on atorvastatin 80 mg.  No myalgias or other SEs.  Has been working on diet and is fasting, would like rechecked today.    COPD/emphysema  Has not needed inhaler  7 cig per day.  Down from 10 from previous visit  Still trying.  Interested in finally trying Chantix.  Has not needed inhalers.  Does occasionally cough/wheeze, no breathing issues today.    Hypothyroidism.  Doing well on current dose.  No Endo sxs.  Lab Results   Component Value Date/Time    TSH 1.590 07/23/2019 11:43 AM     No GERD or GI sxs today.  Continues otc PPI prn.    Thought she was UTD on colonscopy, but unsure when/where.  Denies any abnls.  Agrees to FOBT, but encouraged her to check her MR first.    Review of Systems   Constitutional: Negative.    HENT: Negative.    Respiratory: Negative for shortness of breath.    Cardiovascular: Negative.  Negative for chest pain, palpitations and leg swelling.   Gastrointestinal: Negative.    Genitourinary: Negative.    Skin:  Negative.    Neurological: Negative.      See HPI.    Past Medical History:   Diagnosis Date   ??? Anxiety    ??? Diabetes ([place])    ??? Dyspepsia    ??? Emphysema    ??? Headache(784.0)    ??? Hypertension    ??? IGT (impaired glucose tolerance)    ??? Kidney stones    ??? Lipoma     Left calf   ??? Thyroid disease     hypothryoidism       Past Surgical History:   Procedure Laterality Date   ??? HX HEENT      Tonsillectomy at age 28       Allergies   Allergen Reactions   ??? Ace Inhibitors Cough   ??? Amoxicillin Nausea and Vomiting   ??? Erythromycin Other (comments)     Does not remember.   ??? Sulfa (Sulfonamide Antibiotics) Nausea and Vomiting       Current Outpatient Medications   Medication Sig   ??? varenicline (CHANTIX STARTER PAK) 0.5 mg (11)- 1 mg (42)  DsPk Take as directed. Start 1 wk before quit date if quit date planned or start immediately and stop smoking between days 8-35   ??? alogliptin (Nesina) 25 mg tablet Take 1 Tablet by mouth daily.   ??? atorvastatin (LIPITOR) 80 mg tablet TAKE 1 TABLET BY MOUTH EVERY DAY   ??? FLUoxetine (PROzac) 20 mg capsule TAKE 1 CAPSULE BY MOUTH TWICE A DAY   ??? glipiZIDE (GLUCOTROL) 5 mg tablet TAKE 1 TABLET BY MOUTH EVERY DAY   ??? hydroCHLOROthiazide (HYDRODIURIL) 12.5 mg tablet TAKE 1 TABLET BY MOUTH EVERY DAY   ??? levothyroxine (SYNTHROID) 50 mcg tablet TAKE 1 TABLET BY MOUTH EVERY DAY   ??? losartan (COZAAR) 25 mg tablet TAKE 1 TABLET BY MOUTH EVERY DAY   ??? metFORMIN (GLUCOPHAGE) 1,000 mg tablet TAKE 1 & 1/2 TABLETS BY MOUTH EVERY MORNING AND 1 TABLET IN THE EVENING WITH A MEAL   ??? metoprolol succinate (TOPROL-XL) 50 mg XL tablet TAKE 1 TABLET BY MOUTH EVERY DAY   ??? Omeprazole delayed release (PRILOSEC D/R) 20 mg tablet Take 20 mg by mouth daily.   ??? cholecalciferol, vitamin D3, 50 mcg (2,000 unit) tab Take  by mouth. Taking 1073m   ??? ascorbic acid, vitamin C, (Vitamin C) 500 mg tablet Take  by mouth.   ??? Blood-Glucose Meter monitoring kit accucheck meter   ??? glucose blood VI test strips (ACCU-CHEK AVIVA  PLUS TEST STRP) strip accucheck strips   ??? lancets misc Use for once daily BS checks   ??? aspirin delayed-release 81 mg tablet Take  by mouth daily.   ??? OTHER Taking a pro biotic QElvis CoilV daily   ??? multivitamin (ONE A DAY) tablet Take 1 Tab by mouth daily.   ??? acetaminophen (TYLENOL) 500 mg tablet Take  by mouth every six (6) hours as needed for Pain.   ??? omega-3 fatty acids-vitamin e (FISH OIL) 1,000 mg cap Take 1 Cap by mouth four (4) times daily.     No current facility-administered medications for this visit.       Social History     Socioeconomic History   ??? Marital status: MARRIED     Spouse name: Not on file   ??? Number of children: Not on file   ??? Years of education: Not on file   ??? Highest education level: Not on file   Tobacco Use   ??? Smoking status: Light Tobacco Smoker     Packs/day: 1.00     Years: 50.00     Pack years: 50.00     Last attempt to quit: 01/28/2018     Years since quitting: 2.0   ??? Smokeless tobacco: Never Used   ??? Tobacco comment: started back again after quitting for a few months   Substance and Sexual Activity   ??? Alcohol use: No   ??? Drug use: No   ??? Sexual activity: Yes     Partners: Male     Social Determinants of Health     Financial Resource Strain:    ??? Difficulty of Paying Living Expenses:    Food Insecurity:    ??? Worried About RCharity fundraiserin the Last Year:    ??? RArboriculturistin the Last Year:    Transportation Needs:    ??? LFilm/video editor(Medical):    ??? Lack of Transportation (Non-Medical):    Physical Activity:    ??? Days of Exercise per Week:    ??? Minutes of Exercise  per Session:    Stress:    ??? Feeling of Stress :    Social Connections:    ??? Frequency of Communication with Friends and Family:    ??? Frequency of Social Gatherings with Friends and Family:    ??? Attends Religious Services:    ??? Marine scientist or Organizations:    ??? Attends Archivist Meetings:    ??? Marital Status:        Family History   Problem Relation Age of Onset   ??? Heart Disease  Mother    ??? Other Father         AAA         Physical Exam:  Visit Vitals  BP 136/80 (BP 1 Location: Left upper arm, BP Patient Position: Sitting, BP Cuff Size: Adult)   Pulse 70   Temp 97 ??F (36.1 ??C) (Oral)   Resp 18   Ht '5\' 7"'  (1.702 m)   Wt 175 lb (79.4 kg)   SpO2 98%   BMI 27.41 kg/m??     Physical Exam  Vitals and nursing note reviewed.   Constitutional:       Appearance: Normal appearance.   HENT:      Head: Normocephalic and atraumatic.   Eyes:      Conjunctiva/sclera: Conjunctivae normal.   Cardiovascular:      Rate and Rhythm: Normal rate and regular rhythm.      Pulses: Normal pulses.      Heart sounds: Normal heart sounds.   Pulmonary:      Effort: Pulmonary effort is normal.      Breath sounds: Normal breath sounds.   Musculoskeletal:         General: No swelling.      Cervical back: Neck supple.      Comments: No ulcers, normal sensation. No calluses. Normal pulses.  Toe nails thick but painted.   Skin:     General: Skin is warm and dry.   Neurological:      Mental Status: She is alert and oriented to person, place, and time.   Psychiatric:         Mood and Affect: Mood normal.         Assessment/Plan:    ICD-10-CM ICD-9-CM    1. Essential hypertension  Q00 867.6 METABOLIC PANEL, COMPREHENSIVE      hydroCHLOROthiazide (HYDRODIURIL) 12.5 mg tablet      losartan (COZAAR) 25 mg tablet      metoprolol succinate (TOPROL-XL) 50 mg XL tablet   2. Thyroid disease  E07.9 246.9 TSH 3RD GENERATION      levothyroxine (SYNTHROID) 50 mcg tablet   3. Type 2 diabetes with nephropathy (HCC)  E11.21 250.40 HEMOGLOBIN A1C WITH EAG     583.81 alogliptin (Nesina) 25 mg tablet      glipiZIDE (GLUCOTROL) 5 mg tablet      metFORMIN (GLUCOPHAGE) 1,000 mg tablet   4. Hyperlipidemia, unspecified hyperlipidemia type  E78.5 272.4 LIPID PANEL      atorvastatin (LIPITOR) 80 mg tablet   5. Tobacco dependence  F17.200 305.1 varenicline (CHANTIX STARTER PAK) 0.5 mg (11)- 1 mg (42) DsPk   6. Smoker  F17.200 305.1 varenicline (CHANTIX  STARTER PAK) 0.5 mg (11)- 1 mg (42) DsPk   7. Pulmonary emphysema, unspecified emphysema type (HCC)  J43.9 492.8 varenicline (CHANTIX STARTER PAK) 0.5 mg (11)- 1 mg (42) DsPk   8. Tobacco abuse  Z72.0 305.1 varenicline (CHANTIX STARTER PAK) 0.5 mg (11)-  1 mg (42) DsPk   9. Anxiety  F41.9 300.00 FLUoxetine (PROzac) 20 mg capsule   10. Encounter for immunization  Z23 V03.89 ZOSTER VACC RECOMBINANT ADJUVANTED     Encouraged pt to continue working on LMs with diet and smoking cessation.  Monitor BS and BP.    Follow-up and Dispositions    ?? Return in about 1 month (around 03/24/2020) for f/u 1st month of chantix, will need continueing pack..       Seek care in interim for any new sxs or other concerns.  Pt verbalizes understanding and agrees with the plan.    Juanna Cao, PA-C

## 2020-02-23 NOTE — Progress Notes (Signed)
Notify pt her sugar was 239 at the time of visit and her a1c was worse at 9.2  I would recommend she resume her medications and be sure she takes them all as prescribed this time (ran out months ago of one of them) and we will recheck a1c again in 3 months. I would rec she make this f/up apt now so she does not forget.  Her triglycerides are still up but other lipids are otherwise okay. She should also continue to work on her diet. Her thyroid, electrolytes, kidney, and LFTs are otherwise unremarkable.

## 2020-02-23 NOTE — Progress Notes (Signed)
I have called and talked to this patient and given her the lab results per [facility], PA-C's review.  Patient verbalizes understanding.  The call was transferred to the PSR's to make an apt appointment.

## 2020-02-24 ENCOUNTER — Encounter

## 2020-02-24 LAB — METABOLIC PANEL, COMPREHENSIVE
A-G Ratio: 1.8 (ref 1.1–2.2)
ALT (SGPT): 38 U/L (ref 12–78)
AST (SGOT): 21 U/L (ref 15–37)
Albumin: 4.3 g/dL (ref 3.5–5.0)
Alk. phosphatase: 91 U/L (ref 45–117)
Anion gap: 7 mmol/L (ref 5–15)
BUN/Creatinine ratio: 14 (ref 12–20)
BUN: 8 MG/DL (ref 6–20)
Bilirubin, total: 0.6 MG/DL (ref 0.2–1.0)
CO2: 25 mmol/L (ref 21–32)
Calcium: 9.1 MG/DL (ref 8.5–10.1)
Chloride: 105 mmol/L (ref 97–108)
Creatinine: 0.59 MG/DL (ref 0.55–1.02)
GFR est AA: >60 mL/min/{1.73_m2} (ref >60)
GFR est non-AA: >60 mL/min/{1.73_m2} (ref >60)
Globulin: 2.4 g/dL (ref 2.0–4.0)
Glucose: 239 mg/dL — ABNORMAL HIGH (ref 65–100)
Potassium: 4.5 mmol/L (ref 3.5–5.1)
Protein, total: 6.7 g/dL (ref 6.4–8.2)
Sodium: 137 mmol/L (ref 136–145)

## 2020-02-24 LAB — LIPID PANEL
CHOL/HDL Ratio: 3.6 (ref 0.0–5.0)
Chol/HDL Ratio: 3.6 (ref 0.0–5.0)
Cholesterol, Total: 123 MG/DL (ref <200)
Cholesterol, total: 123 MG/DL (ref <200)
HDL Cholesterol: 34 MG/DL
HDL: 34 MG/DL
LDL Calculated: 35.2 MG/DL (ref 0–100)
LDL, calculated: 35.2 MG/DL (ref 0–100)
Triglyceride: 269 MG/DL — ABNORMAL HIGH (ref <150)
Triglycerides: 269 MG/DL — ABNORMAL HIGH (ref <150)
VLDL Cholesterol Calculated: 53.8 MG/DL
VLDL, calculated: 53.8 MG/DL

## 2020-02-24 LAB — TSH 3RD GENERATION
TSH: 2.38 u[IU]/mL (ref 0.36–3.74)
TSH: 2.38 u[IU]/mL (ref 0.36–3.74)

## 2020-02-24 LAB — HEMOGLOBIN A1C WITH EAG
Est. average glucose: 217 mg/dL
Hemoglobin A1c: 9.2 % — ABNORMAL HIGH (ref 4.0–5.6)

## 2020-02-24 LAB — COMPREHENSIVE METABOLIC PANEL
ALT: 38 U/L (ref 12–78)
AST: 21 U/L (ref 15–37)
Albumin/Globulin Ratio: 1.8 (ref 1.1–2.2)
Albumin: 4.3 g/dL (ref 3.5–5.0)
Alkaline Phosphatase: 91 U/L (ref 45–117)
Anion Gap: 7 mmol/L (ref 5–15)
BUN: 8 MG/DL (ref 6–20)
Bun/Cre Ratio: 14 (ref 12–20)
CO2: 25 mmol/L (ref 21–32)
Calcium: 9.1 MG/DL (ref 8.5–10.1)
Chloride: 105 mmol/L (ref 97–108)
Creatinine: 0.59 MG/DL (ref 0.55–1.02)
EGFR IF NonAfrican American: >60 mL/min/{1.73_m2} (ref >60)
GFR African American: >60 mL/min/{1.73_m2} (ref >60)
Globulin: 2.4 g/dL (ref 2.0–4.0)
Glucose: 239 mg/dL — ABNORMAL HIGH (ref 65–100)
Potassium: 4.5 mmol/L (ref 3.5–5.1)
Sodium: 137 mmol/L (ref 136–145)
Total Bilirubin: 0.6 MG/DL (ref 0.2–1.0)
Total Protein: 6.7 g/dL (ref 6.4–8.2)

## 2020-02-24 LAB — HEMOGLOBIN A1C W/EAG
Hemoglobin A1C: 9.2 % — ABNORMAL HIGH (ref 4.0–5.6)
eAG: 217 mg/dL

## 2020-02-24 NOTE — Telephone Encounter (Signed)
 Requested Prescriptions     Pending Prescriptions Disp Refills   . metFORMIN  (GLUCOPHAGE ) 1,000 mg tablet 135 Tablet 1     Sig: TAKE 1 & 1/2 TABLETS BY MOUTH EVERY MORNING AND 1 TABLET IN THE EVENING WITH A MEAL

## 2020-02-24 NOTE — Telephone Encounter (Signed)
Requested Prescriptions     Pending Prescriptions Disp Refills   . alogliptin (Nesina) 25 mg tablet 90 Tablet 1     Sig: Take 1 Tablet by mouth daily.

## 2020-02-25 NOTE — Telephone Encounter (Signed)
Was sent at her recent apt with refills

## 2020-03-24 ENCOUNTER — Encounter: Attending: Physician Assistant | Primary: Physician Assistant

## 2020-04-14 ENCOUNTER — Encounter

## 2020-04-14 NOTE — Telephone Encounter (Signed)
Requested Prescriptions     Pending Prescriptions Disp Refills   . alogliptin (Nesina) 25 mg tablet 90 Tablet 1     Sig: Take 1 Tablet by mouth daily.

## 2020-05-18 IMAGING — CT CT LUMBAR SPINE WO CONTRAST
3 series · 8 of 14 positions shown, 9 images · non-contrast
Comparison: none

Lumbar spine CT
Location number is 1
Images are obtained without myelographic contrast. Images obtained include the axial images as well as reconstruction images obtained in sagittal and coronal planes.
HISTORY: Radiculopathy, lumbar region
There is mild 5 degree dextroscoliosis of the lumbar spine.
Bones are osteopenic.
There is indentation canal Schmorl's node insults old insults demonstrated T12, L1, L2, and L3.
L1-L2 is unremarkable.
L2-L3 shows disc bulge osteophyte.
L3-L4 show small disc bulge osteophyte. There is mild bilateral ligament flavum and facet hypertrophy. Moderate canal bilateral lateral recess narrowing are present L3-4.
L4-5 shows disc bulge osteophyte complex which along with the moderate bilateral ligament flavum hypertrophy is causing moderate to severe canal bilateral lateral recess narrowing.
L5-S1 is unremarkable.
Paraspinal musculature is diffusely atrophic. Visualized retroperitoneal structures appear unremarkable.

[Series 2: l-spine · axial · 0.39mm/px · z∈[-100,+12]mm · 3 of 91 slices shown, 4 images]
[im 23/91  soft-tissue]
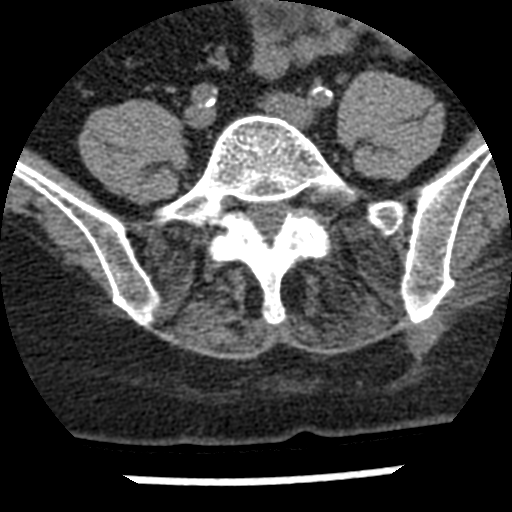
[im 23/91  bone]
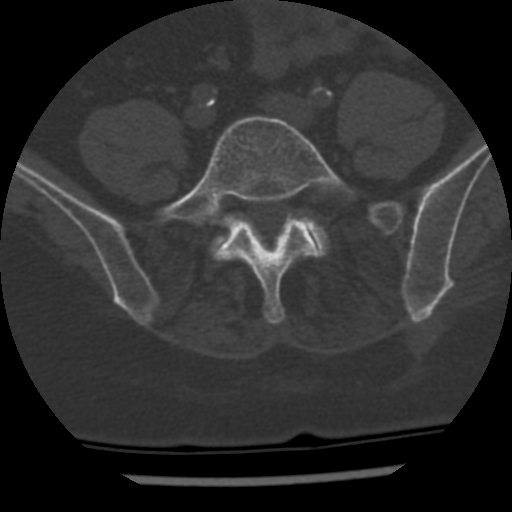
[im 46/91  bone]
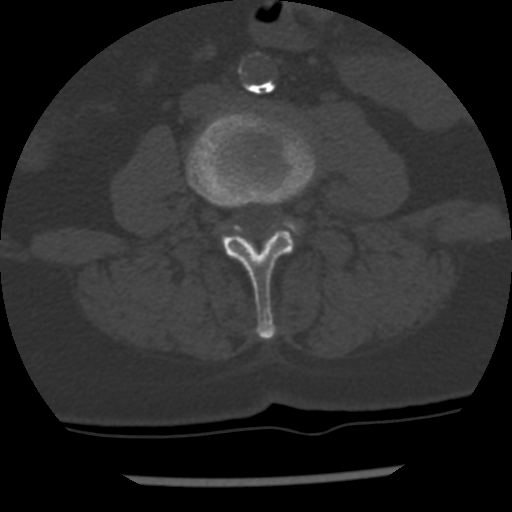
[im 68/91  bone]
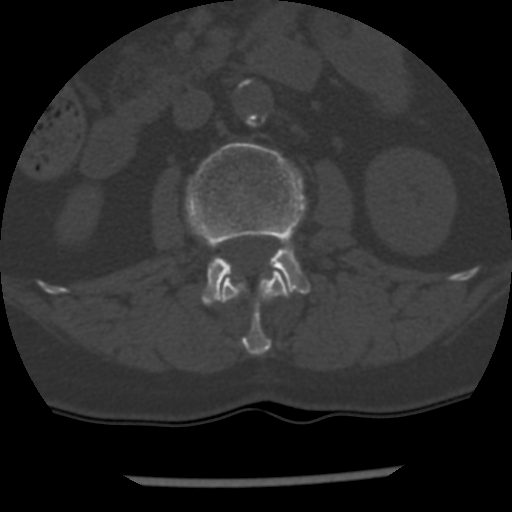

[Series 601: coronal · coronal · 0.44mm/px · 2 of 88 slices shown]
[im 30/88  bone]
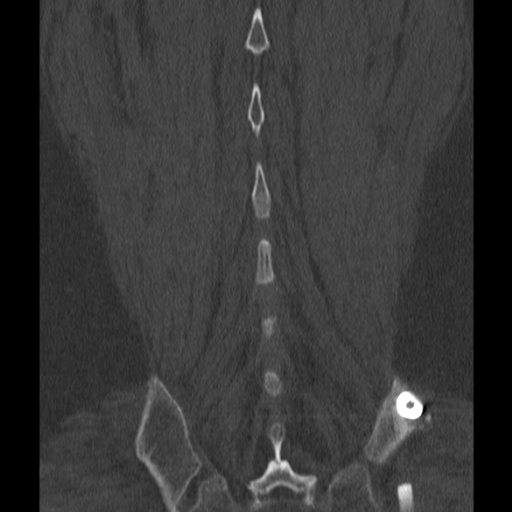
[im 59/88  bone]
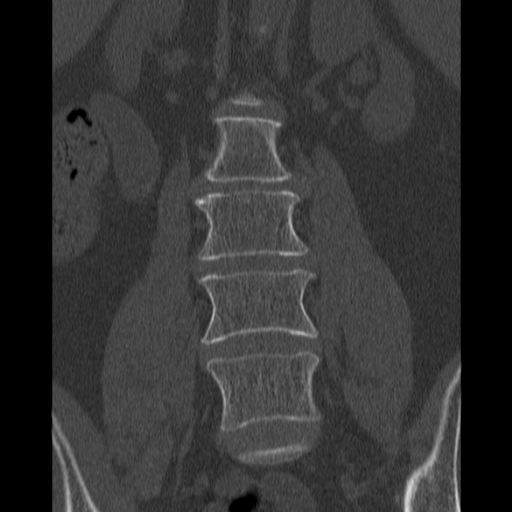

[Series 602: sagittal · sagittal · 0.44mm/px · 3 of 100 slices shown]
[im 25/100  bone]
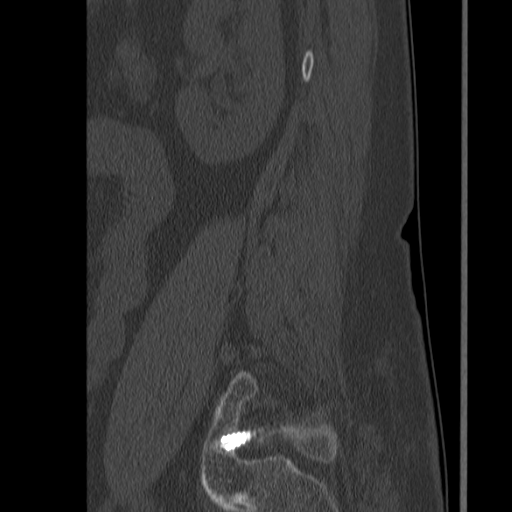
[im 50/100  bone]
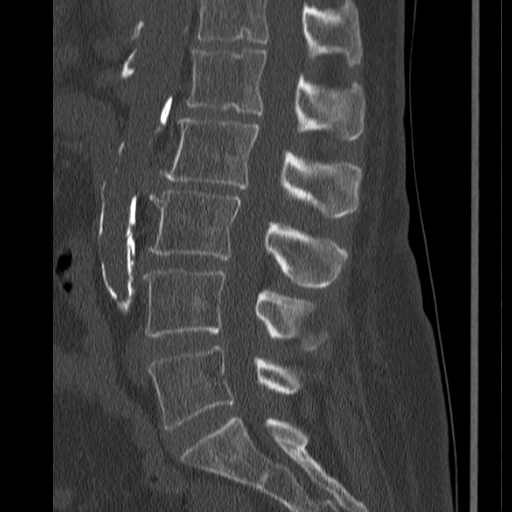
[im 75/100  bone]
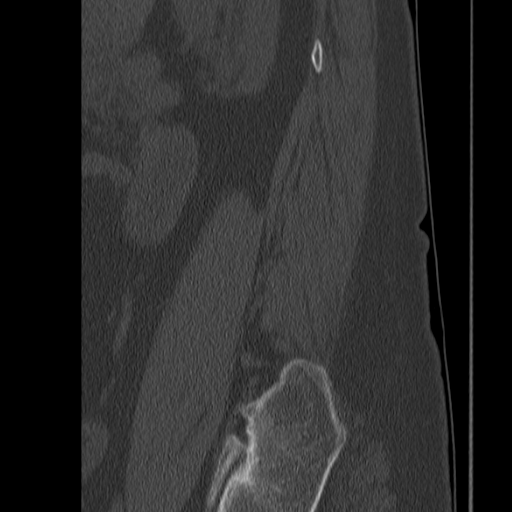

[8 of 14 positions shown; findings below may reference images not displayed]

IMPRESSION: Moderate canal bilateral lateral recess during L3-L4 and moderate to severe canal bilateral lateral recess narrowing L4-5.

## 2020-06-02 ENCOUNTER — Ambulatory Visit: Attending: Physician Assistant | Primary: Physician Assistant

## 2020-06-02 ENCOUNTER — Ambulatory Visit
Admit: 2020-06-02 | Discharge: 2020-06-02 | Payer: BLUE CROSS/BLUE SHIELD | Attending: Physician Assistant | Primary: Physician Assistant

## 2020-06-02 DIAGNOSIS — E1121 Type 2 diabetes mellitus with diabetic nephropathy: Secondary | ICD-10-CM

## 2020-06-02 LAB — AMB POC HEMOGLOBIN A1C
Hemoglobin A1C, POC: 8.3 %
Hemoglobin A1c (POC): 8.3 %

## 2020-06-02 MED ORDER — ALOGLIPTIN 25 MG TABLET
25 mg | ORAL_TABLET | Freq: Every day | ORAL | 1 refills | Status: DC
Start: 2020-06-02 — End: 2021-06-05

## 2020-06-02 NOTE — Progress Notes (Signed)
Madison Peters is a 69 y.o. female who presents to the office today with the following:  Chief Complaint   Patient presents with   ??? Follow-up     3 month follow up       HPI  HTN  Her BP has been under good control.  Pt says she has been compliant with all the medications she picks up from the pharmacy.  No cardiac complaints.    Lab Results   Component Value Date/Time    Hemoglobin A1c 9.2 (H) 02/23/2020 10:36 AM    Hemoglobin A1c (POC) 8.3 06/02/2020 11:01 AM   did not get alogliptin (nesina) after last.  Compliant on metformin and glipizide.  No BS checks reported today.    Will return for flu vaccine.  Is going to visit son.    Will return again for fasting labs in Dec.  Pt otherwise reports feeling well with no other complaints or acute concerns.  Chantix did not help, but continues to cut back on smoking.    ROS  See HPI.    Past Medical History:   Diagnosis Date   ??? Anxiety    ??? Diabetes ([place])    ??? Dyspepsia    ??? Emphysema    ??? Headache(784.0)    ??? Hypertension    ??? IGT (impaired glucose tolerance)    ??? Kidney stones    ??? Lipoma     Left calf   ??? Thyroid disease     hypothryoidism       Past Surgical History:   Procedure Laterality Date   ??? HX HEENT      Tonsillectomy at age 86       Allergies   Allergen Reactions   ??? Ace Inhibitors Cough   ??? Amoxicillin Nausea and Vomiting   ??? Erythromycin Other (comments)     Does not remember.   ??? Sulfa (Sulfonamide Antibiotics) Nausea and Vomiting       Current Outpatient Medications   Medication Sig   ??? alogliptin (Nesina) 25 mg tablet Take 1 Tablet by mouth daily.   ??? atorvastatin (LIPITOR) 80 mg tablet TAKE 1 TABLET BY MOUTH EVERY DAY   ??? FLUoxetine (PROzac) 20 mg capsule TAKE 1 CAPSULE BY MOUTH TWICE A DAY   ??? glipiZIDE (GLUCOTROL) 5 mg tablet TAKE 1 TABLET BY MOUTH EVERY DAY   ??? hydroCHLOROthiazide (HYDRODIURIL) 12.5 mg tablet TAKE 1 TABLET BY MOUTH EVERY DAY   ??? levothyroxine (SYNTHROID) 50 mcg tablet TAKE 1 TABLET BY MOUTH EVERY DAY   ??? losartan (COZAAR) 25 mg  tablet TAKE 1 TABLET BY MOUTH EVERY DAY   ??? metFORMIN (GLUCOPHAGE) 1,000 mg tablet TAKE 1 & 1/2 TABLETS BY MOUTH EVERY MORNING AND 1 TABLET IN THE EVENING WITH A MEAL   ??? metoprolol succinate (TOPROL-XL) 50 mg XL tablet TAKE 1 TABLET BY MOUTH EVERY DAY   ??? cholecalciferol, vitamin D3, 50 mcg (2,000 unit) tab Take  by mouth. Taking 1093m   ??? ascorbic acid, vitamin C, (Vitamin C) 500 mg tablet Take  by mouth.   ??? Blood-Glucose Meter monitoring kit accucheck meter   ??? glucose blood VI test strips (ACCU-CHEK AVIVA PLUS TEST STRP) strip accucheck strips   ??? lancets misc Use for once daily BS checks   ??? aspirin delayed-release 81 mg tablet Take  by mouth daily.   ??? multivitamin (ONE A DAY) tablet Take 1 Tab by mouth daily.   ??? acetaminophen (TYLENOL) 500 mg tablet Take  by mouth every six (6) hours as needed for Pain.   ??? omega-3 fatty acids-vitamin e (FISH OIL) 1,000 mg cap Take 1 Cap by mouth four (4) times daily.     No current facility-administered medications for this visit.       Social History     Socioeconomic History   ??? Marital status: MARRIED     Spouse name: Not on file   ??? Number of children: Not on file   ??? Years of education: Not on file   ??? Highest education level: Not on file   Tobacco Use   ??? Smoking status: Light Tobacco Smoker     Packs/day: 1.00     Years: 50.00     Pack years: 50.00     Last attempt to quit: 01/28/2018     Years since quitting: 2.3   ??? Smokeless tobacco: Never Used   ??? Tobacco comment: started back again after quitting for a few months   Substance and Sexual Activity   ??? Alcohol use: No   ??? Drug use: No   ??? Sexual activity: Yes     Partners: Male     Social Determinants of Health     Financial Resource Strain:    ??? Difficulty of Paying Living Expenses:    Food Insecurity:    ??? Worried About Charity fundraiser in the Last Year:    ??? Arboriculturist in the Last Year:    Transportation Needs:    ??? Film/video editor (Medical):    ??? Lack of Transportation (Non-Medical):    Physical  Activity:    ??? Days of Exercise per Week:    ??? Minutes of Exercise per Session:    Stress:    ??? Feeling of Stress :    Social Connections:    ??? Frequency of Communication with Friends and Family:    ??? Frequency of Social Gatherings with Friends and Family:    ??? Attends Religious Services:    ??? Marine scientist or Organizations:    ??? Attends Archivist Meetings:    ??? Marital Status:        Family History   Problem Relation Age of Onset   ??? Heart Disease Mother    ??? Other Father         AAA         Physical Exam:  Visit Vitals  BP 128/73 (BP 1 Location: Right arm, BP Patient Position: Sitting, BP Cuff Size: Adult)   Pulse 88   Temp 98.4 ??F (36.9 ??C) (Oral)   Resp 16   Ht '5\' 7"'  (1.702 m)   Wt 174 lb 6.4 oz (79.1 kg)   SpO2 94%   BMI 27.31 kg/m??     Physical Exam  Vitals and nursing note reviewed.   Constitutional:       Appearance: Normal appearance.   HENT:      Head: Normocephalic and atraumatic.   Cardiovascular:      Rate and Rhythm: Normal rate and regular rhythm.      Pulses: Normal pulses.      Heart sounds: Normal heart sounds.   Pulmonary:      Effort: Pulmonary effort is normal.      Breath sounds: Normal breath sounds.   Musculoskeletal:         General: No swelling.   Skin:     General: Skin is warm and dry.   Neurological:  Mental Status: She is alert and oriented to person, place, and time.   Psychiatric:         Mood and Affect: Mood normal.         Assessment/Plan:    ICD-10-CM ICD-9-CM    1. Type 2 diabetes with nephropathy (HCC)  E11.21 250.40 AMB POC HEMOGLOBIN A1C     583.81 alogliptin (Nesina) 25 mg tablet   pt in hurry, will notify of results and further rec.  Pt plans to return for flu shot next wk.    Results for orders placed or performed in visit on 06/02/20   AMB POC HEMOGLOBIN A1C   Result Value Ref Range    Hemoglobin A1c (POC) 8.3 %     Sent result message to notify pt a1c worse, to try new med, work on LMs, recheck in Dec as planned,  Report BS readings in a couple  weeks to monitor progress.    Follow-up and Dispositions    ?? Return in about 3 months (around 09/01/2020), or sooner prn.       Seek care in interim for any new sxs or other concerns.  Pt verbalizes understanding and agrees with the plan.    Juanna Cao, PA-C

## 2020-06-02 NOTE — Progress Notes (Signed)
Notify pt a1c worse. I have re-sent the new Rx I wanted her to try last time, she is to take this in ADDN to others, if she can check her FBS in the morning and report readings to me in the next few weeks that would be helpful. She should still plan to see me in the office again in 3 months. Continue to work on diet/LMs. Let me know if any issues getting this medication or tolerating it.

## 2020-06-02 NOTE — Progress Notes (Signed)
LVM to return call office regarding lab results and recommendations

## 2020-06-02 NOTE — Progress Notes (Signed)
Spoke with patient after verifying Name, and DOB, informed patient of lab results and recommendations per Theda Sers- PA-C .   Patient given an opportunity to ask questions, repeated information, and verbalized understanding. Patient aware to keep record of BS, new medication sent to pharmacy and she needed to follow up in 3 months

## 2020-06-02 NOTE — Progress Notes (Signed)
1. Have you been to the ER, urgent care clinic since your last visit?  Hospitalized since your last visit?No    2. Have you seen or consulted any other health care providers outside of the [facility] since your last visit?  Include any pap smears or colon screening. No

## 2020-06-10 ENCOUNTER — Ambulatory Visit: Payer: BLUE CROSS/BLUE SHIELD | Primary: Physician Assistant

## 2020-06-15 ENCOUNTER — Ambulatory Visit: Payer: BLUE CROSS/BLUE SHIELD | Primary: Physician Assistant

## 2020-08-08 ENCOUNTER — Encounter

## 2020-08-08 NOTE — Telephone Encounter (Signed)
 Requested Prescriptions     Pending Prescriptions Disp Refills   . metFORMIN  (GLUCOPHAGE ) 1,000 mg tablet 135 Tablet 1     Sig: TAKE 1 & 1/2 TABLETS BY MOUTH EVERY MORNING AND 1 TABLET IN THE EVENING WITH A MEAL     **I have already called patient and left a message for her to call the office. She is overdo for her 3 mo f/u.**

## 2020-08-09 MED ORDER — METFORMIN 1,000 MG TAB
1000 mg | ORAL_TABLET | ORAL | 0 refills | Status: AC
Start: 2020-08-09 — End: ?

## 2020-08-09 NOTE — Telephone Encounter (Signed)
Pt overdue for 3 month a1c recheck

## 2020-09-05 ENCOUNTER — Encounter: Attending: Physician Assistant | Primary: Physician Assistant

## 2020-09-14 ENCOUNTER — Ambulatory Visit: Payer: BLUE CROSS/BLUE SHIELD | Attending: Physician Assistant | Primary: Physician Assistant

## 2020-10-20 ENCOUNTER — Encounter

## 2020-10-20 MED ORDER — ATORVASTATIN 80 MG TAB
80 mg | ORAL_TABLET | ORAL | 1 refills | Status: DC
Start: 2020-10-20 — End: 2021-06-05

## 2020-10-20 MED ORDER — FLUOXETINE 20 MG CAP
20 mg | ORAL_CAPSULE | ORAL | 1 refills | Status: DC
Start: 2020-10-20 — End: 2021-06-05

## 2020-10-20 MED ORDER — LEVOTHYROXINE 50 MCG TAB
50 mcg | ORAL_TABLET | ORAL | 1 refills | Status: DC
Start: 2020-10-20 — End: 2021-06-05

## 2020-10-20 MED ORDER — METOPROLOL SUCCINATE SR 50 MG 24 HR TAB
50 mg | ORAL_TABLET | ORAL | 1 refills | Status: DC
Start: 2020-10-20 — End: 2021-06-05

## 2020-12-01 IMAGING — MR MRI CERVICAL SPINE WITHOUT CONTRAST
6 of 8 series · 21 of 48 positions shown · non-contrast
Comparison: none

EXAM: MRI CERVICAL SPINE WITHOUT CONTRAST
Other specified dorsopathies, cervical region
PROCEDURE: Sagittal T1, T2, STIR, and axial fast field echo and fast field echo T2 images of the cervical spine are obtained without contrast.

[Series 101: survey · axial · 10.0mm · 0.94mm/px · 1 of 9 slices shown]
[im 1/9]
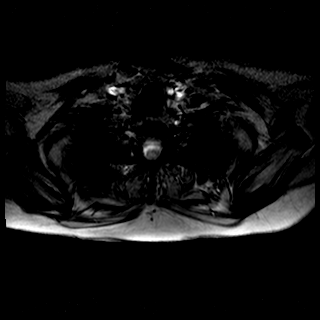

[Series 401: T2 · sagittal · 3.0mm · 0.47mm/px · 4 of 13 slices shown (1 of 2)]
[im 1/13]
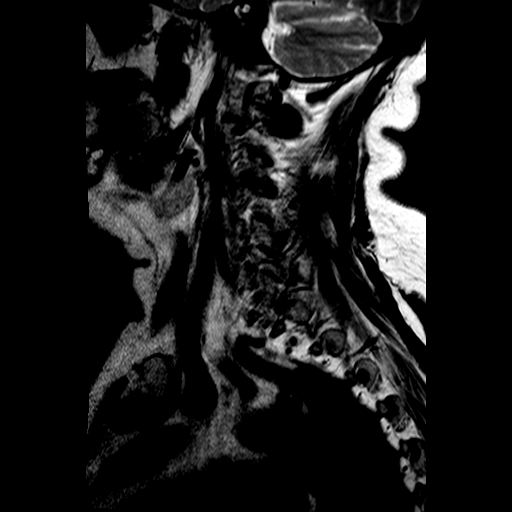
[im 5/13]
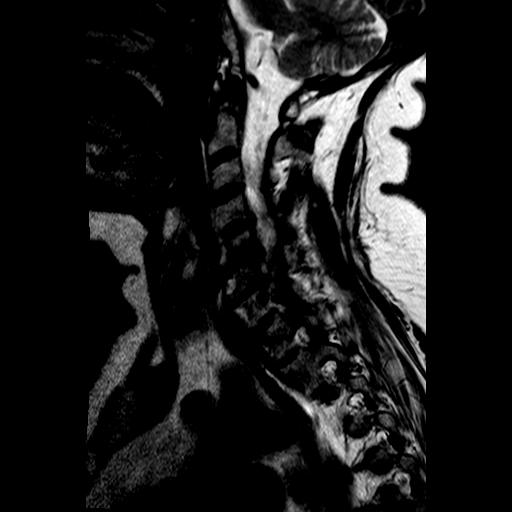
[im 9/13]
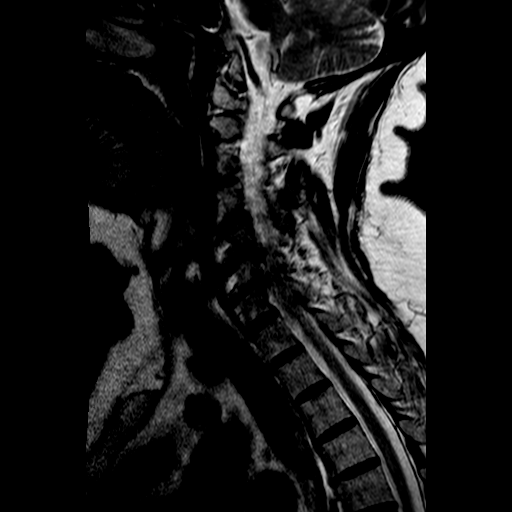
[im 13/13]
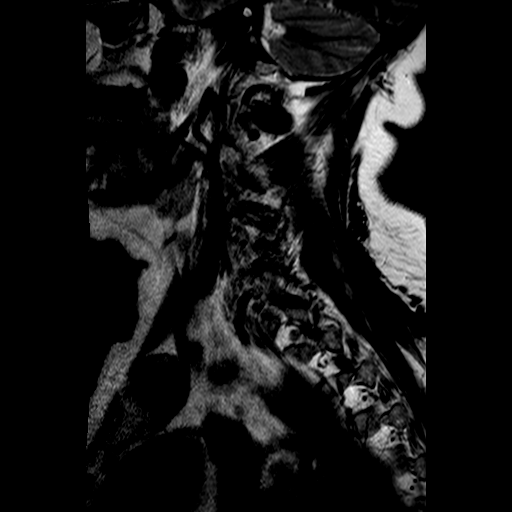

[Series 501: T1 · sagittal · 3.0mm · 0.50mm/px · 4 of 13 slices shown]
[im 1/13]
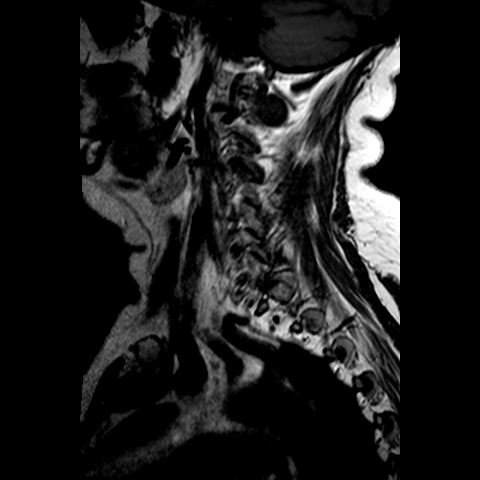
[im 5/13]
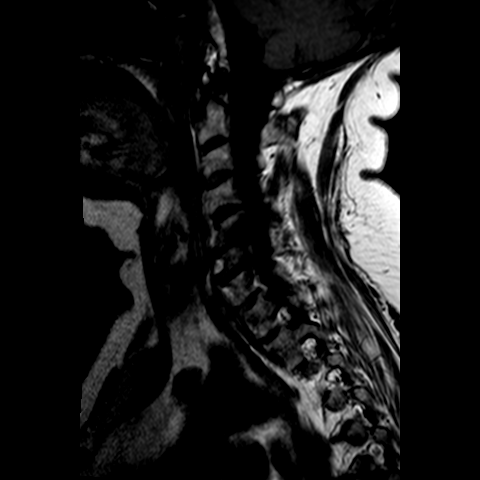
[im 9/13]
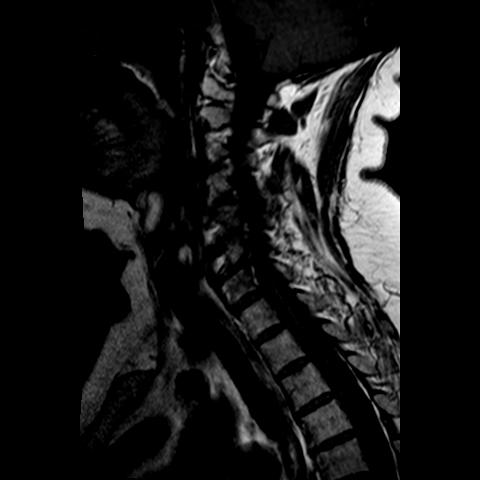
[im 13/13]
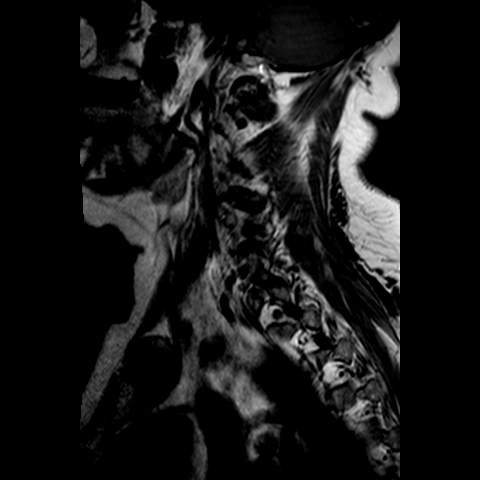

[Series 601: STIR · sagittal · 3.0mm · 0.50mm/px · 4 of 13 slices shown (1 of 2)]
[im 1/13]
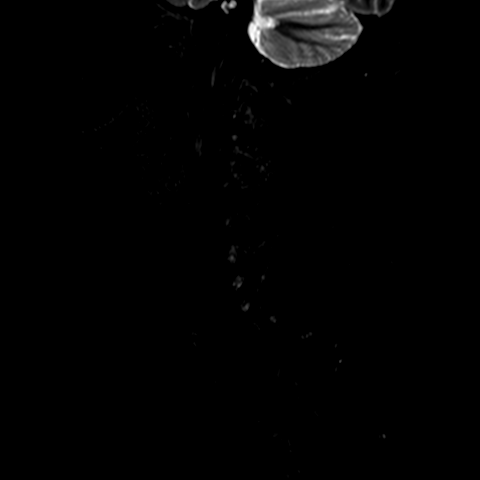
[im 5/13]
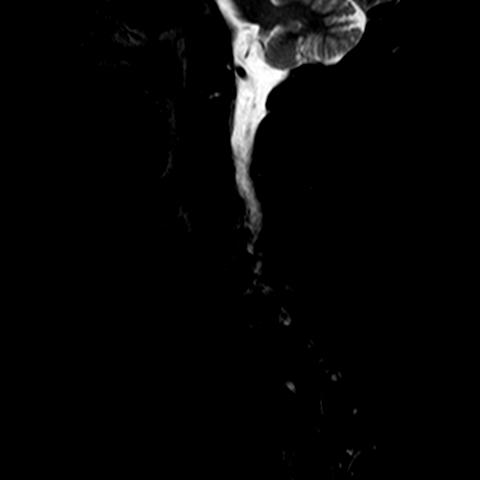
[im 9/13]
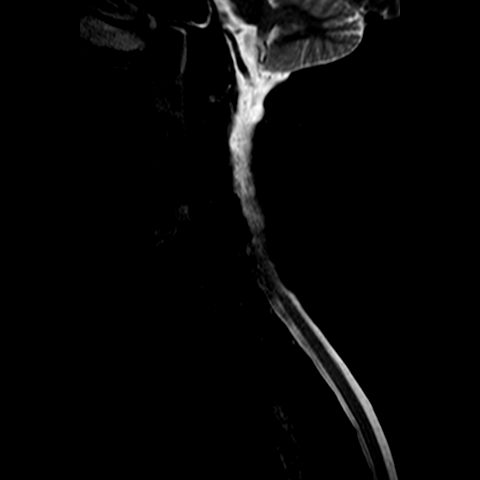
[im 13/13]
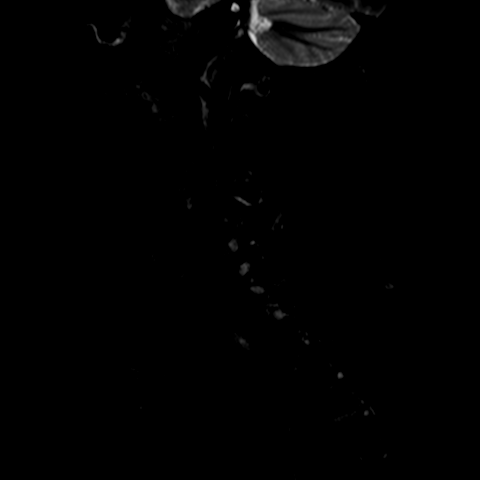

[Series 701: T2 · sagittal · 3.0mm · 0.47mm/px · 4 of 13 slices shown (2 of 2)]
[im 1/13]
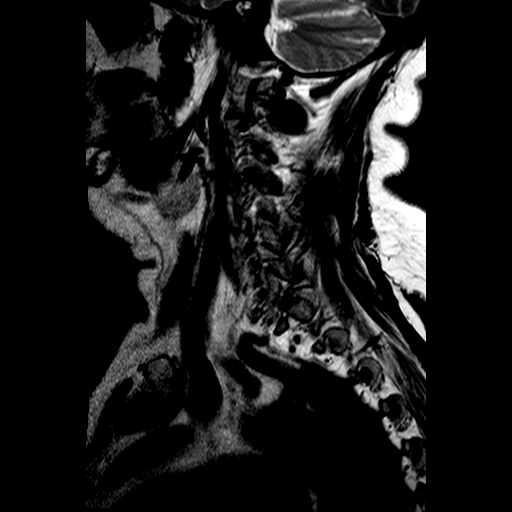
[im 5/13]
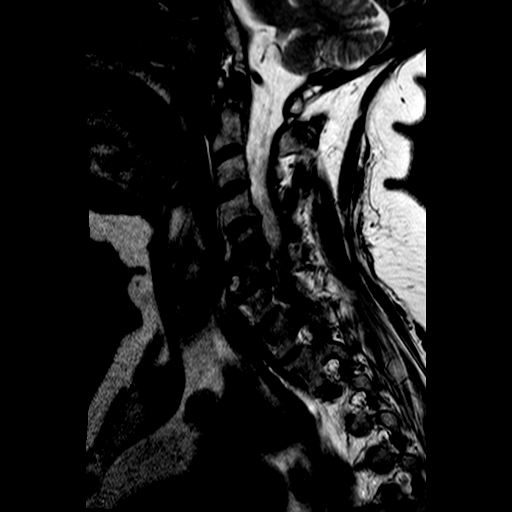
[im 9/13]
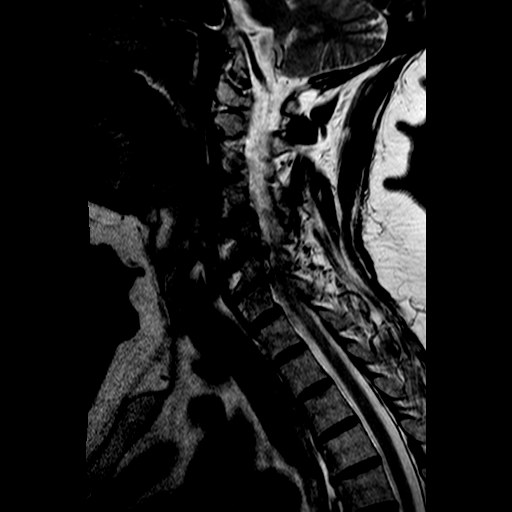
[im 13/13]
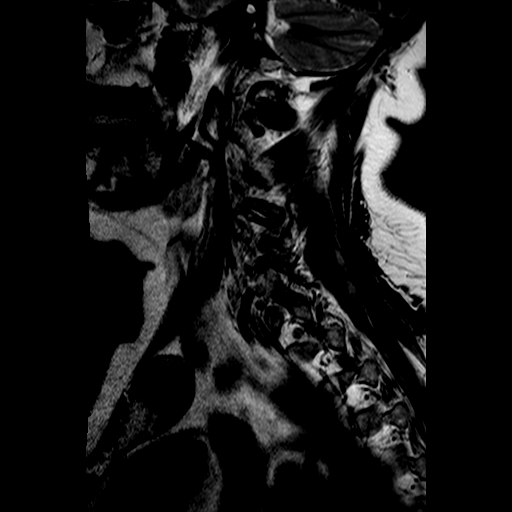

[Series 801: STIR · sagittal · 3.0mm · 0.50mm/px · 4 of 12 slices shown (2 of 2)]
[im 1/12]
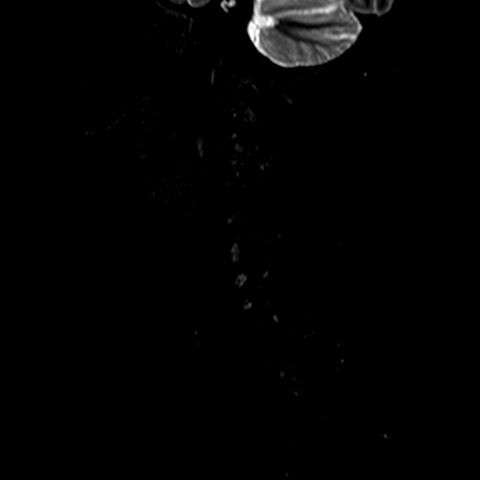
[im 4/12]
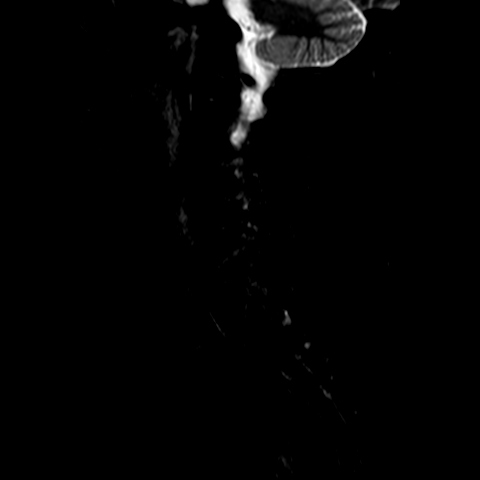
[im 8/12]
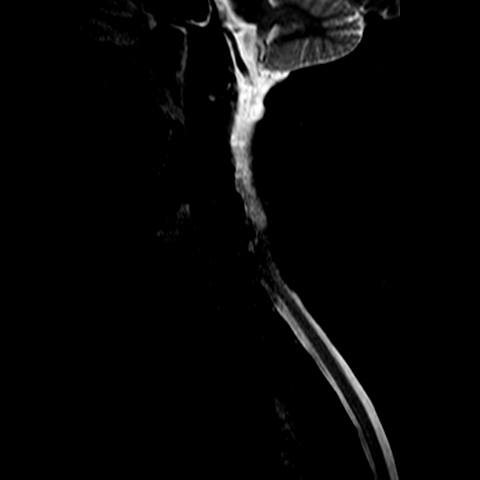
[im 12/12]
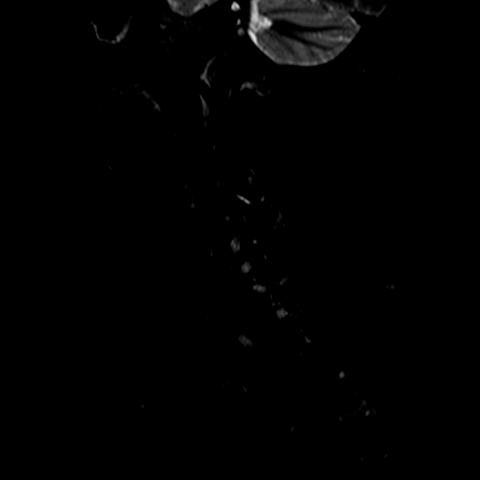

[21 of 48 positions shown; findings below may reference images not displayed]

FINDINGS: There is straightening of cervical lordosis. Moderate to severe disc space narrowing is noted at C5-6 and C6-7. The vertebral bodies maintain normal height. The spinal cord maintains normal caliber and is normal in signal. Cerebellar tonsils are normal in position.
At C2-3, there is no canal or neural foraminal stenosis.
At C3-4, disc bulge is noted without canal or neural foraminal stenosis.
At C4-5, disc bulge is seen flattening the ventral spinal canal. There is no significant canal stenosis. There is mild bilateral neural foraminal stenosis.
At C5-6, disc bulge osteophyte complex is seen effacing the ventral spinal canal. There is no significant canal stenosis. There is moderate bilateral neural foraminal stenosis.
At C6-7, disc bulge osteophyte complex is noted. There is no canal stenosis. There is moderate bilateral neural foraminal stenosis.
At C6-7, there is no canal or neural foraminal stenosis.
The visualized paravertebral soft tissues are unremarkable.
IMPRESSION: There is loss of cervical lordosis with cervical kyphosis. Multiple levels of disc disease as described. Please see detailed discussion above.
Location 1

## 2021-06-05 ENCOUNTER — Ambulatory Visit: Attending: Physician Assistant | Primary: Physician Assistant

## 2021-06-05 ENCOUNTER — Ambulatory Visit
Admit: 2021-06-05 | Discharge: 2021-06-05 | Payer: BLUE CROSS/BLUE SHIELD | Attending: Physician Assistant | Primary: Physician Assistant

## 2021-06-05 DIAGNOSIS — E1121 Type 2 diabetes mellitus with diabetic nephropathy: Secondary | ICD-10-CM

## 2021-06-05 MED ORDER — LEVOTHYROXINE 50 MCG TAB
50 mcg | ORAL_TABLET | Freq: Every day | ORAL | 1 refills | Status: AC
Start: 2021-06-05 — End: ?

## 2021-06-05 MED ORDER — FLUOXETINE 20 MG CAP
20 mg | ORAL_CAPSULE | Freq: Two times a day (BID) | ORAL | 1 refills | Status: AC
Start: 2021-06-05 — End: ?

## 2021-06-05 MED ORDER — HYDROCHLOROTHIAZIDE 12.5 MG TAB
12.5 mg | ORAL_TABLET | ORAL | 1 refills | Status: AC
Start: 2021-06-05 — End: ?

## 2021-06-05 MED ORDER — ATORVASTATIN 80 MG TAB
80 mg | ORAL_TABLET | Freq: Every day | ORAL | 1 refills | Status: AC
Start: 2021-06-05 — End: ?

## 2021-06-05 MED ORDER — METOPROLOL SUCCINATE SR 50 MG 24 HR TAB
50 mg | ORAL_TABLET | Freq: Every day | ORAL | 1 refills | Status: AC
Start: 2021-06-05 — End: ?

## 2021-06-05 MED ORDER — LOSARTAN 25 MG TAB
25 mg | ORAL_TABLET | ORAL | 1 refills | Status: AC
Start: 2021-06-05 — End: ?

## 2021-06-05 NOTE — Progress Notes (Signed)
Progress Notes by Malachy Moan, LPN at 47/42/13 0830                Author: Malachy Moan, LPN  Service: --  Author Type: Licensed Nurse       Filed: 06/12/21 1321  Encounter Date: 06/05/2021  Status: Signed          Editor: Malachy Moan, LPN (Licensed Nurse)               Spoke with patient after verifying Name, and DOB, informed patient of lab results and recommendations per Dr. Anastasio Champion .   Patient given an opportunity to ask questions, repeated information, and verbalized understanding.  Call transferred to [facility] to schedule follow up visit.

## 2021-06-05 NOTE — Progress Notes (Signed)
Labs drawn in left arm per Sarah's orders.  Patient tolerated well.

## 2021-06-05 NOTE — Progress Notes (Signed)
Progress Notes by Malachy Moan, LPN at 24/26/83 0830                Author: Malachy Moan, LPN  Service: --  Author Type: Licensed Nurse       Filed: 06/09/21 1613  Encounter Date: 06/05/2021  Status: Signed          Editor: Malachy Moan, LPN (Licensed Nurse)               LVM to return call office regarding lab results and recommendations

## 2021-06-05 NOTE — Progress Notes (Signed)
1. "Have you been to the ER, urgent care clinic since your last visit?  Hospitalized since your last visit?"No    2. "Have you seen or consulted any other health care providers outside of the [facility] since your last visit?" No    3. For patients aged 69-75: Has the patient had a colonoscopy / FIT/ Cologuard? No      If the patient is female:    4. For patients aged 24-74: Has the patient had a mammogram within the past 2 years? No      5. For patients aged 21-65: Has the patient had a pap smear? No

## 2021-06-05 NOTE — Progress Notes (Signed)
Madison Peters is a 70 y.o. female who presents to the office today with the following:  Chief Complaint   Patient presents with    Follow-up    Diabetes       Follow-up    Diabetes    Pt presents for routine f/up and labs.  Is fasting.  Pt reports feeling well today w/o concerns.    Pt says she is working on diet and has lost 15 lbs since last year.  T2DM, NID.  No sugar checks.   Never got the alogliptin Laurey Morale)  Lab Results   Component Value Date/Time    Hemoglobin A1c 9.2 (H) 02/23/2020 10:36 AM    Hemoglobin A1c (POC) 8.3 06/02/2020 11:01 AM   Goes to eye doctor every year.    hypothyroidism  Lab Results   Component Value Date/Time    TSH 2.38 02/23/2020 10:36 AM   Due for recheck.  Has been stable on current dose.    Hyperlipidemia  Doing well on atorvastatin.  No SE/myalgias.  Lab Results   Component Value Date/Time    Cholesterol, total 123 02/23/2020 10:36 AM    HDL Cholesterol 34 02/23/2020 10:36 AM    LDL, calculated 35.2 02/23/2020 10:36 AM    VLDL, calculated 53.8 02/23/2020 10:36 AM    Triglyceride 269 (H) 02/23/2020 10:36 AM    CHOL/HDL Ratio 3.6 02/23/2020 10:36 AM     Lab Results   Component Value Date/Time    ALT (SGPT) 38 02/23/2020 10:36 AM    AST (SGOT) 21 02/23/2020 10:36 AM    Alk. phosphatase 91 02/23/2020 10:36 AM    Bilirubin, total 0.6 02/23/2020 10:36 AM     Anxiety still controlled on fluoxetine dose.  Denies depression/SI or other psych symptoms.  Also no med SEs.    Would like flu shot.  ROS  See HPI.    Past Medical History:   Diagnosis Date    Anxiety     Diabetes ([place])     Dyspepsia     Emphysema     Headache(784.0)     Hypertension     IGT (impaired glucose tolerance)     Kidney stones     Lipoma     Left calf    Thyroid disease     hypothryoidism       Past Surgical History:   Procedure Laterality Date    HX HEENT      Tonsillectomy at age 80       Allergies   Allergen Reactions    Ace Inhibitors Cough    Amoxicillin Nausea and Vomiting    Erythromycin Other (comments)     Does  not remember.    Sulfa (Sulfonamide Antibiotics) Nausea and Vomiting       Current Outpatient Medications   Medication Sig    clindamycin (CLEOCIN) 150 mg capsule     FLUoxetine (PROzac) 20 mg capsule Take 1 Capsule by mouth two (2) times a day.    hydroCHLOROthiazide (HYDRODIURIL) 12.5 mg tablet TAKE 1 TABLET BY MOUTH EVERY DAY    losartan (COZAAR) 25 mg tablet TAKE 1 TABLET BY MOUTH EVERY DAY    metoprolol succinate (TOPROL-XL) 50 mg XL tablet Take 1 Tablet by mouth daily.    atorvastatin (LIPITOR) 80 mg tablet Take 1 Tablet by mouth daily.    levothyroxine (SYNTHROID) 50 mcg tablet Take 1 Tablet by mouth daily.    metFORMIN (GLUCOPHAGE) 1,000 mg tablet TAKE 1 & 1/2 TABLETS BY MOUTH EVERY  MORNING AND 1 TABLET IN THE EVENING WITH A MEAL    glipiZIDE (GLUCOTROL) 5 mg tablet TAKE 1 TABLET BY MOUTH EVERY DAY    cholecalciferol, vitamin D3, 50 mcg (2,000 unit) tab Take  by mouth. Taking 1043m    ascorbic acid, vitamin C, (VITAMIN C) 500 mg tablet Take  by mouth.    Blood-Glucose Meter monitoring kit accucheck meter    glucose blood VI test strips (ACCU-CHEK AVIVA PLUS TEST STRP) strip accucheck strips    lancets misc Use for once daily BS checks    aspirin delayed-release 81 mg tablet Take  by mouth daily.    multivitamin (ONE A DAY) tablet Take 1 Tab by mouth daily.    acetaminophen (TYLENOL) 500 mg tablet Take  by mouth every six (6) hours as needed for Pain.    omega-3 fatty acids-vitamin e 1,000 mg cap Take 1 Cap by mouth four (4) times daily.     No current facility-administered medications for this visit.       Social History     Socioeconomic History    Marital status: MARRIED   Tobacco Use    Smoking status: Light Smoker     Packs/day: 1.00     Years: 50.00     Pack years: 50.00     Types: Cigarettes     Last attempt to quit: 01/28/2018     Years since quitting: 3.3    Smokeless tobacco: Never    Tobacco comments:     started back again after quitting for a few months   Substance and Sexual Activity    Alcohol  use: No    Drug use: No    Sexual activity: Yes     Partners: Male       Family History   Problem Relation Age of Onset    Heart Disease Mother     Other Father         AAA       Physical Exam:  Visit Vitals  BP 131/79 (BP 1 Location: Left upper arm, BP Patient Position: Sitting, BP Cuff Size: Adult)   Pulse 90   Temp 97 ??F (36.1 ??C) (Temporal)   Resp 18   Ht '5\' 7"'  (1.702 m)   Wt 158 lb (71.7 kg)   SpO2 97%   BMI 24.75 kg/m??     Physical Exam  Vitals and nursing note reviewed.   Constitutional:       Appearance: Normal appearance.      Comments: Pleasant WF.   HENT:      Head: Normocephalic and atraumatic.   Eyes:      Conjunctiva/sclera: Conjunctivae normal.   Cardiovascular:      Rate and Rhythm: Normal rate and regular rhythm.      Pulses: Normal pulses.      Heart sounds: Normal heart sounds.   Pulmonary:      Effort: Pulmonary effort is normal. No respiratory distress.      Breath sounds: No stridor. Wheezing present. No rhonchi or rales.      Comments: Faint wheeze heard throughout, otherwise CTAB and decent air movement.  Musculoskeletal:         General: No swelling.      Cervical back: Neck supple.   Skin:     General: Skin is warm and dry.   Neurological:      Mental Status: She is alert and oriented to person, place, and time.   Psychiatric:  Mood and Affect: Mood normal.         Behavior: Behavior normal.       Assessment/Plan:    ICD-10-CM ICD-9-CM    1. Type 2 diabetes with nephropathy (HCC)  E11.21 250.40 CBC WITH AUTOMATED DIFF     583.81 MICROALBUMIN, UR, RAND W/ MICROALB/CREAT RATIO      HEMOGLOBIN A1C WITH EAG      HM DIABETES FOOT EXAM      2. Primary hypertension  I10 401.9 CBC WITH AUTOMATED DIFF      METABOLIC PANEL, COMPREHENSIVE      3. Hyperlipidemia, unspecified hyperlipidemia type  E78.5 272.4 CBC WITH AUTOMATED DIFF      LIPID PANEL      atorvastatin (LIPITOR) 80 mg tablet      4. Anxiety  F41.9 300.00 FLUoxetine (PROzac) 20 mg capsule      5. Essential hypertension  I10 401.9  hydroCHLOROthiazide (HYDRODIURIL) 12.5 mg tablet      losartan (COZAAR) 25 mg tablet      metoprolol succinate (TOPROL-XL) 50 mg XL tablet      6. Thyroid disease  E07.9 246.9 levothyroxine (SYNTHROID) 50 mcg tablet      TSH 3RD GENERATION      7. Encounter for immunization  Z23 V03.89 INFLUENZA, FLUAD, (AGE 81 Y+), IM, PF, 0.5 ML      8. Colon cancer screening  Z12.11 V76.51 COLOGUARD TEST (FECAL DNA COLORECTAL CANCER SCREENING)      9. Breast cancer screening by mammogram  Z12.31 V76.12 MAM MAMMO BI SCREENING INCL CAD        Discussed screenings.   declines CT lung CA screening.  Prefers cologuard for colon CA.  She will schedule mammogram.    Routine 3-6 mo f/up pending lab results.  Encouraged pt to continue working on LMs, including smoking cessation and diet.  Seek care in interim for any new sxs or other concerns.  Pt verbalizes understanding and agrees with the plan.    Juanna Cao, PA-C

## 2021-06-05 NOTE — Progress Notes (Signed)
Progress Notes by Malachy Moan, LPN at 77/58/45 0830                Author: Malachy Moan, LPN  Service: --  Author Type: Licensed Nurse       Filed: 06/12/21 1021  Encounter Date: 06/05/2021  Status: Signed          Editor: Malachy Moan, LPN (Licensed Nurse)               LVM to return call office regarding lab results and recommendations

## 2021-06-05 NOTE — Progress Notes (Signed)
Progress Notes by Marlan Palau, PA-C at 06/05/21 0830                Author: Marlan Palau, PA-C  Service: --  Author Type: Physician Assistant       Filed: 06/09/21 1312  Encounter Date: 06/05/2021  Status: Signed          Editor: Jeanmarie Hubert (Physician Assistant)               Notify pt her thyroid test is wnl.   Her a1c/sugars have gotten significantly worse and unlikely to be controlld with diet and her current po meds. I would like her to make another f/up apt in next week to discuss further treatment. It would be helpful if she could keep a log of her BS readings  to bring in with her.   Trig also up likely due to worsening DM.    Avoid conc sweets, limit fatty foods ie trans/sat fats.    Remaining labs stable.

## 2021-06-06 LAB — LIPID PANEL
CHOL/HDL Ratio: 4.8 (ref 0.0–5.0)
Chol/HDL Ratio: 4.8 (ref 0.0–5.0)
Cholesterol, Total: 171 MG/DL (ref <200)
Cholesterol, total: 171 MG/DL (ref <200)
HDL Cholesterol: 36 MG/DL
HDL: 36 MG/DL
LDL Calculated: 68.6 MG/DL (ref 0–100)
LDL, calculated: 68.6 MG/DL (ref 0–100)
Triglyceride: 332 MG/DL — ABNORMAL HIGH (ref <150)
Triglycerides: 332 MG/DL — ABNORMAL HIGH (ref <150)
VLDL Cholesterol Calculated: 66.4 MG/DL
VLDL, calculated: 66.4 MG/DL

## 2021-06-06 LAB — METABOLIC PANEL, COMPREHENSIVE
A-G Ratio: 1.8 (ref 1.1–2.2)
ALT (SGPT): 31 U/L (ref 12–78)
AST (SGOT): 13 U/L — ABNORMAL LOW (ref 15–37)
Albumin: 4.5 g/dL (ref 3.5–5.0)
Alk. phosphatase: 92 U/L (ref 45–117)
Anion gap: 6 mmol/L (ref 5–15)
BUN/Creatinine ratio: 27 — ABNORMAL HIGH (ref 12–20)
BUN: 23 MG/DL — ABNORMAL HIGH (ref 6–20)
Bilirubin, total: 0.5 MG/DL (ref 0.2–1.0)
CO2: 26 mmol/L (ref 21–32)
Calcium: 9.6 MG/DL (ref 8.5–10.1)
Chloride: 100 mmol/L (ref 97–108)
Creatinine: 0.84 MG/DL (ref 0.55–1.02)
Globulin: 2.5 g/dL (ref 2.0–4.0)
Glucose: 375 mg/dL — ABNORMAL HIGH (ref 65–100)
Potassium: 4.2 mmol/L (ref 3.5–5.1)
Protein, total: 7 g/dL (ref 6.4–8.2)
Sodium: 132 mmol/L — ABNORMAL LOW (ref 136–145)
eGFR: >60 mL/min/{1.73_m2} (ref >60)

## 2021-06-06 LAB — CBC WITH AUTOMATED DIFF
ABS. BASOPHILS: 0.1 10*3/uL (ref 0.0–0.1)
ABS. EOSINOPHILS: 0.1 10*3/uL (ref 0.0–0.4)
ABS. IMM. GRANS.: 0 10*3/uL (ref 0.00–0.04)
ABS. LYMPHOCYTES: 1.3 10*3/uL (ref 0.8–3.5)
ABS. MONOCYTES: 0.5 10*3/uL (ref 0.0–1.0)
ABS. NEUTROPHILS: 5.9 10*3/uL (ref 1.8–8.0)
ABSOLUTE NRBC: 0 10*3/uL (ref 0.00–0.01)
BASOPHILS: 2 % — ABNORMAL HIGH (ref 0–1)
EOSINOPHILS: 1 % (ref 0–7)
HCT: 44.7 % (ref 35.0–47.0)
HGB: 15.1 g/dL (ref 11.5–16.0)
IMMATURE GRANULOCYTES: 0 % (ref 0.0–0.5)
LYMPHOCYTES: 16 % (ref 12–49)
MCH: 30.4 PG (ref 26.0–34.0)
MCHC: 33.8 g/dL (ref 30.0–36.5)
MCV: 90.1 FL (ref 80.0–99.0)
MONOCYTES: 6 % (ref 5–13)
MPV: 9.8 FL (ref 8.9–12.9)
NEUTROPHILS: 75 % (ref 32–75)
NRBC: 0 PER 100 WBC
PLATELET: 263 10*3/uL (ref 150–400)
RBC: 4.96 M/uL (ref 3.80–5.20)
RDW: 12.3 % (ref 11.5–14.5)
WBC: 7.9 10*3/uL (ref 3.6–11.0)

## 2021-06-06 LAB — HEMOGLOBIN A1C WITH EAG
Est. average glucose: 298 mg/dL
Hemoglobin A1c: 12 % — ABNORMAL HIGH (ref 4.0–5.6)

## 2021-06-06 LAB — TSH 3RD GENERATION
TSH: 2.87 u[IU]/mL (ref 0.36–3.74)
TSH: 2.87 u[IU]/mL (ref 0.36–3.74)

## 2021-06-06 LAB — MICROALBUMIN, UR, RAND W/ MICROALB/CREAT RATIO
Creatinine, urine random: 49.7 mg/dL
Microalbumin,urine random: 0.96 MG/DL
Microalbumin/Creat ratio (mg/g creat): 19 mg/g (ref 0–30)

## 2021-06-06 LAB — CBC WITH AUTO DIFFERENTIAL
Basophils %: 2 % — ABNORMAL HIGH (ref 0–1)
Basophils Absolute: 0.1 10*3/uL (ref 0.0–0.1)
Eosinophils %: 1 % (ref 0–7)
Eosinophils Absolute: 0.1 10*3/uL (ref 0.0–0.4)
Granulocyte Absolute Count: 0 10*3/uL (ref 0.00–0.04)
Hematocrit: 44.7 % (ref 35.0–47.0)
Hemoglobin: 15.1 g/dL (ref 11.5–16.0)
Immature Granulocytes %: 0 % (ref 0.0–0.5)
Lymphocytes %: 16 % (ref 12–49)
Lymphocytes Absolute: 1.3 10*3/uL (ref 0.8–3.5)
MCH: 30.4 PG (ref 26.0–34.0)
MCHC: 33.8 g/dL (ref 30.0–36.5)
MCV: 90.1 FL (ref 80.0–99.0)
MPV: 9.8 FL (ref 8.9–12.9)
Monocytes %: 6 % (ref 5–13)
Monocytes Absolute: 0.5 10*3/uL (ref 0.0–1.0)
NRBC Absolute: 0 10*3/uL (ref 0.00–0.01)
Neutrophils %: 75 % (ref 32–75)
Neutrophils Absolute: 5.9 10*3/uL (ref 1.8–8.0)
Nucleated RBCs: 0 PER 100 WBC
Platelets: 263 10*3/uL (ref 150–400)
RBC: 4.96 M/uL (ref 3.80–5.20)
RDW: 12.3 % (ref 11.5–14.5)
WBC: 7.9 10*3/uL (ref 3.6–11.0)

## 2021-06-06 LAB — COMPREHENSIVE METABOLIC PANEL
ALT: 31 U/L (ref 12–78)
AST: 13 U/L — ABNORMAL LOW (ref 15–37)
Albumin/Globulin Ratio: 1.8 (ref 1.1–2.2)
Albumin: 4.5 g/dL (ref 3.5–5.0)
Alkaline Phosphatase: 92 U/L (ref 45–117)
Anion Gap: 6 mmol/L (ref 5–15)
BUN/Creatinine Ratio: 27 — ABNORMAL HIGH (ref 12–20)
BUN: 23 MG/DL — ABNORMAL HIGH (ref 6–20)
CO2: 26 mmol/L (ref 21–32)
Calcium: 9.6 MG/DL (ref 8.5–10.1)
Chloride: 100 mmol/L (ref 97–108)
Creatinine: 0.84 MG/DL (ref 0.55–1.02)
Est, Glom Filt Rate: >60 mL/min/{1.73_m2} (ref >60)
Globulin: 2.5 g/dL (ref 2.0–4.0)
Glucose: 375 mg/dL — ABNORMAL HIGH (ref 65–100)
Potassium: 4.2 mmol/L (ref 3.5–5.1)
Sodium: 132 mmol/L — ABNORMAL LOW (ref 136–145)
Total Bilirubin: 0.5 MG/DL (ref 0.2–1.0)
Total Protein: 7 g/dL (ref 6.4–8.2)

## 2021-06-06 LAB — MICROALBUMIN / CREATININE URINE RATIO
Creatinine, Ur: 49.7 mg/dL
Microalb, Ur: 0.96 MG/DL
Microalb/Creat Ratio: 19 mg/g (ref 0–30)

## 2021-06-06 LAB — HEMOGLOBIN A1C W/EAG
Estimated Avg Glucose: 298 mg/dL
Hemoglobin A1C: 12 % — ABNORMAL HIGH (ref 4.0–5.6)

## 2021-06-21 ENCOUNTER — Encounter: Attending: Geriatric Medicine | Primary: Physician Assistant

## 2021-07-04 ENCOUNTER — Encounter: Attending: Physician Assistant | Primary: Physician Assistant

## 2021-07-04 MED ORDER — LANCETS
11 refills | Status: AC
Start: 2021-07-04 — End: ?

## 2021-07-04 MED ORDER — BLOOD GLUCOSE METER KIT
PACK | 0 refills | Status: AC
Start: 2021-07-04 — End: ?

## 2021-07-04 MED ORDER — DAPAGLIFLOZIN 10 MG TABLET
10 mg | ORAL_TABLET | Freq: Every day | ORAL | 1 refills | Status: AC
Start: 2021-07-04 — End: ?

## 2021-07-04 MED ORDER — ACCU-CHEK AVIVA PLUS TEST STRIPS
ORAL_STRIP | 5 refills | Status: AC
Start: 2021-07-04 — End: ?

## 2021-07-04 MED ORDER — GLIPIZIDE 5 MG TAB
5 mg | ORAL_TABLET | Freq: Two times a day (BID) | ORAL | 0 refills | Status: AC
Start: 2021-07-04 — End: ?

## 2021-07-04 NOTE — Progress Notes (Signed)
Madison Peters is a 70 y.o. female who presents to the office today with the following:  Chief Complaint   Patient presents with    Diabetes     Follow up       Diabetes  T2DM  Lab Results   Component Value Date/Time    Hemoglobin A1c 12.0 (H) 06/05/2021 09:30 AM    Hemoglobin A1c (POC) 8.3 06/02/2020 11:01 AM   No home checks.  Also cannot find her glucometer, so has not been checking as directed from last lab.  Pt says she has been eating a lot of sugar with her grandkids.  Admits she could make some drastic changes in her diet.  DOES NOT WANT INSULIN.  Understands the indications and risks of poorly tx DM.  Has had difficulty getting other medications due to cost, but would like to try again.  She is not sure what came of her [name] Rx but no longer takes it.  Sitagliptin (Januvia) was too expensive, she says.    ROS  See HPI.    Past Medical History:   Diagnosis Date    Anxiety     Diabetes ([place])     Dyspepsia     Emphysema     Headache(784.0)     Hypertension     IGT (impaired glucose tolerance)     Kidney stones     Lipoma     Left calf    Thyroid disease     hypothryoidism       Past Surgical History:   Procedure Laterality Date    HX HEENT      Tonsillectomy at age 60       Allergies   Allergen Reactions    Ace Inhibitors Cough    Amoxicillin Nausea and Vomiting    Erythromycin Other (comments)     Does not remember.    Sulfa (Sulfonamide Antibiotics) Nausea and Vomiting       Current Outpatient Medications   Medication Sig    dapagliflozin (FARXIGA) 10 mg tab tablet Take 1 Tablet by mouth daily.    glipiZIDE (GLUCOTROL) 5 mg tablet Take 1 Tablet by mouth two (2) times a day. TAKE 1 TABLET BY MOUTH EVERY DAY    Blood-Glucose Meter monitoring kit Use to check BS up to TID.    lancets misc Use as directed to check BS up to TID.    glucose blood VI test strips (Accu-Chek Aviva Plus test strp) strip Use to check BS as directed up to TID    FLUoxetine (PROzac) 20 mg capsule Take 1 Capsule by mouth two (2) times a  day.    hydroCHLOROthiazide (HYDRODIURIL) 12.5 mg tablet TAKE 1 TABLET BY MOUTH EVERY DAY    losartan (COZAAR) 25 mg tablet TAKE 1 TABLET BY MOUTH EVERY DAY    metoprolol succinate (TOPROL-XL) 50 mg XL tablet Take 1 Tablet by mouth daily.    atorvastatin (LIPITOR) 80 mg tablet Take 1 Tablet by mouth daily.    levothyroxine (SYNTHROID) 50 mcg tablet Take 1 Tablet by mouth daily.    metFORMIN (GLUCOPHAGE) 1,000 mg tablet TAKE 1 & 1/2 TABLETS BY MOUTH EVERY MORNING AND 1 TABLET IN THE EVENING WITH A MEAL    cholecalciferol, vitamin D3, 50 mcg (2,000 unit) tab Take  by mouth. Taking 1060m    Blood-Glucose Meter monitoring kit accucheck meter    lancets misc Use for once daily BS checks    aspirin delayed-release 81 mg tablet Take  by  mouth daily.    acetaminophen (TYLENOL) 500 mg tablet Take  by mouth every six (6) hours as needed for Pain.    ascorbic acid, vitamin C, (VITAMIN C) 500 mg tablet Take  by mouth. (Patient not taking: Reported on 07/04/2021)    multivitamin (ONE A DAY) tablet Take 1 Tab by mouth daily.    omega-3 fatty acids-vitamin e 1,000 mg cap Take 1 Cap by mouth four (4) times daily.     No current facility-administered medications for this visit.       Social History     Socioeconomic History    Marital status: MARRIED   Tobacco Use    Smoking status: Light Smoker     Packs/day: 1.00     Years: 50.00     Pack years: 50.00     Types: Cigarettes     Last attempt to quit: 01/28/2018     Years since quitting: 3.4    Smokeless tobacco: Never    Tobacco comments:     started back again after quitting for a few months   Substance and Sexual Activity    Alcohol use: No    Drug use: No    Sexual activity: Yes     Partners: Male       Family History   Problem Relation Age of Onset    Heart Disease Mother     Other Father         AAA         Physical Exam:  Visit Vitals  BP 110/67 (BP 1 Location: Right upper arm, BP Patient Position: Sitting, BP Cuff Size: Adult)   Pulse 79   Temp 97.5 ??F (36.4 ??C)   Resp 12   Ht  5' 7" (1.702 m)   Wt 157 lb (71.2 kg)   SpO2 93%   BMI 24.59 kg/m??     Physical Exam  Vitals and nursing note reviewed.   Constitutional:       Appearance: Normal appearance.   HENT:      Head: Normocephalic and atraumatic.   Eyes:      Conjunctiva/sclera: Conjunctivae normal.   Cardiovascular:      Rate and Rhythm: Normal rate and regular rhythm.      Pulses: Normal pulses.      Heart sounds: Normal heart sounds.   Pulmonary:      Effort: Pulmonary effort is normal.      Breath sounds: Normal breath sounds.   Musculoskeletal:         General: No swelling.      Cervical back: Neck supple.   Skin:     General: Skin is warm and dry.   Neurological:      Mental Status: She is alert and oriented to person, place, and time.   Psychiatric:         Mood and Affect: Mood normal.       Assessment/Plan:    ICD-10-CM ICD-9-CM    1. Type 2 diabetes with nephropathy (HCC)  E11.21 250.40 dapagliflozin (FARXIGA) 10 mg tab tablet     583.81 glipiZIDE (GLUCOTROL) 5 mg tablet      Blood-Glucose Meter monitoring kit      lancets misc      glucose blood VI test strips (Accu-Chek Aviva Plus test strp) strip      REFERRAL TO DIABETIC EDUCATION          will try to refill farxiga, pt will notify if not cost effective and  contact her insurance carrier to see what is on formulary and will call the office to let me know.  Will go to BID on her glipizide 5 mg.  Will cut out sweets and work on other diet recommendations such as low trans/sat fats.  She will pick up new machine and bring by office if needs help to check her BS.  She will keep log and f/up in 1-2 weeks with that after starting new medication.  Pt agrees to rtc or seek care in interim for any new sxs or other concerns.  Pt verbalizes understanding and agrees with the plan.    Total time spent with the patient today was 25 minutes of which more than 50% was spent face to face with the patient.      Juanna Cao, PA-C

## 2021-07-04 NOTE — Progress Notes (Signed)
1. "Have you been to the ER, urgent care clinic since your last visit?  Hospitalized since your last visit?" No    2. "Have you seen or consulted any other health care providers outside of the [facility] since your last visit?" No     3. For patients aged 69-75: Has the patient had a colonoscopy / FIT/ Cologuard? Yes - no Care Gap present      If the patient is female:    4. For patients aged 40-74: Has the patient had a mammogram within the past 2 years? Yes - no Care Gap present      5. For patients aged 21-65: Has the patient had a pap smear? NA - based on age or sex

## 2021-08-14 NOTE — Telephone Encounter (Signed)
Left message to return call - cologuard needs sample soon, or will delete order.  If there is a problem, please advise to can be addressed.

## 2021-08-15 ENCOUNTER — Telehealth

## 2021-08-15 NOTE — Telephone Encounter (Signed)
Pt returns Madison Peters's call re cologuard and needing to do the test.     Pt states she never got a kit form [employer]. Can you resubmit the order.

## 2021-08-16 NOTE — Telephone Encounter (Signed)
Order sent.

## 2021-11-09 IMAGING — US US CAROTID DOPPLER BILATERAL
1 series · 14 of 24 positions shown · non-contrast
Comparison: 04/29/2017

FINAL REPORT:
Carotid ultrasound
HISTORY: Atherosclerosis. Carotid stenosis..
TECHNIQUE: Real-time grayscale, color Doppler, and spectral Doppler images were acquired through the carotid and vertebral arteries.

[Series 1: us carotid doppler bilateral · 14 of 43 slices shown]
[im 1/43]
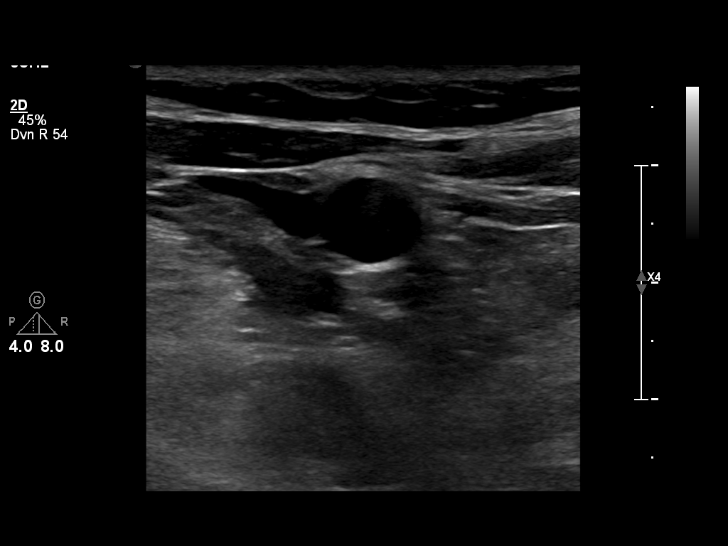
[im 4/43]
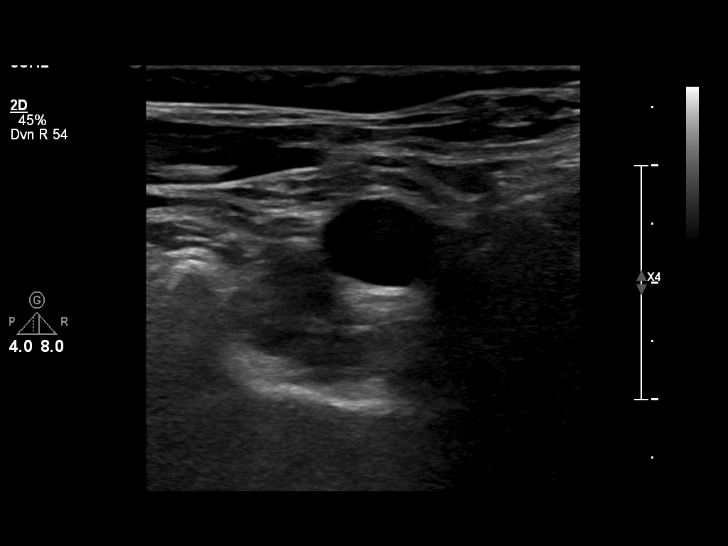
[im 8/43]
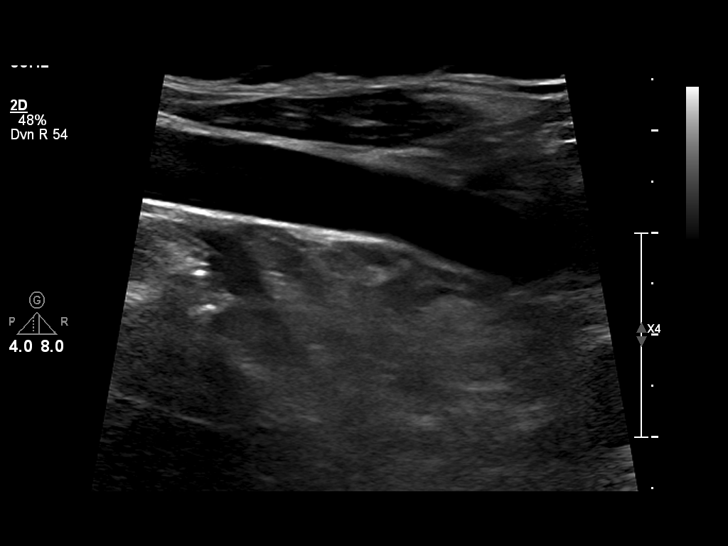
[im 11/43]
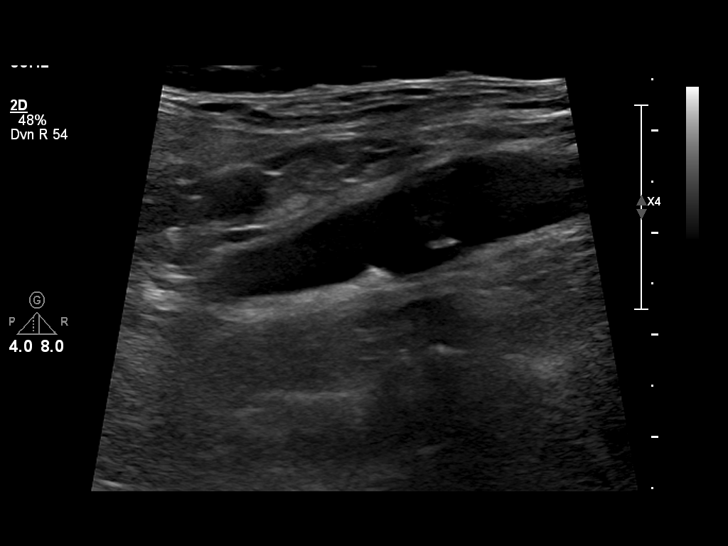
[im 13/43]
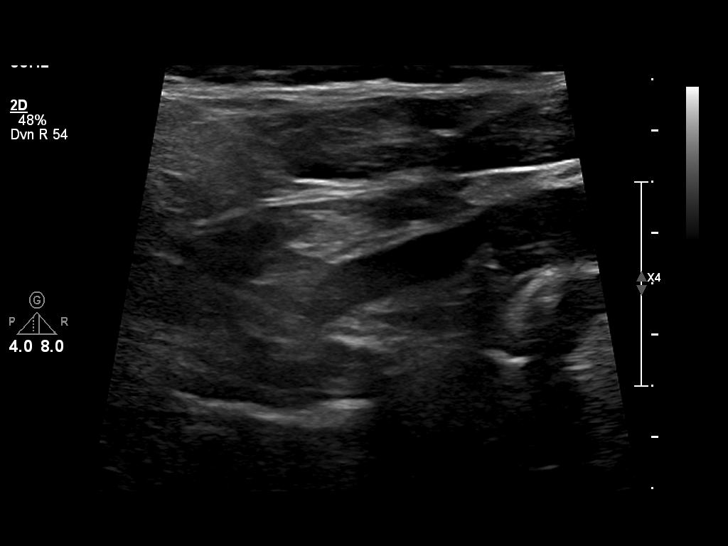
[im 17/43]
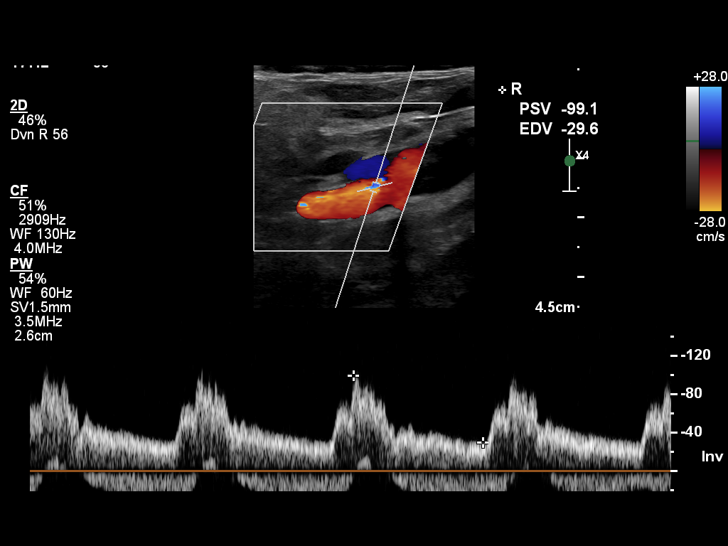
[im 21/43]
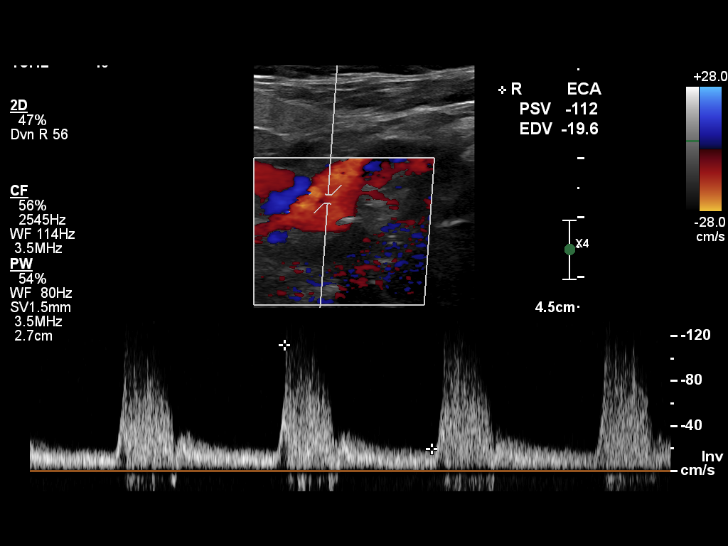
[im 22/43]
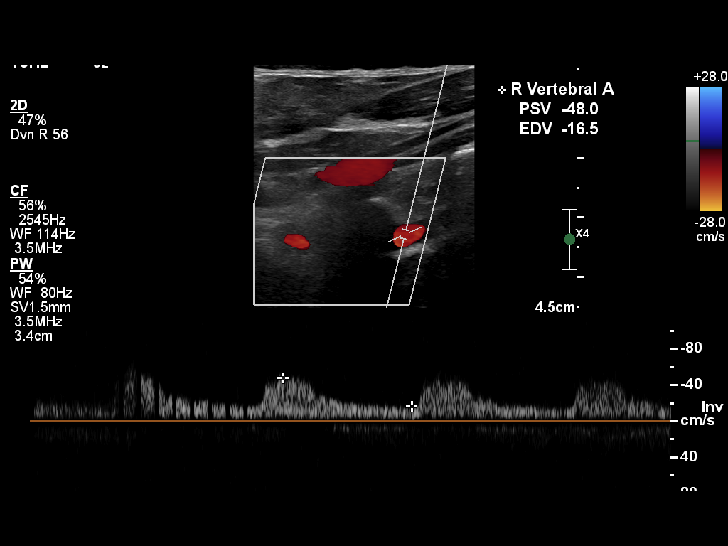
[im 26/43]
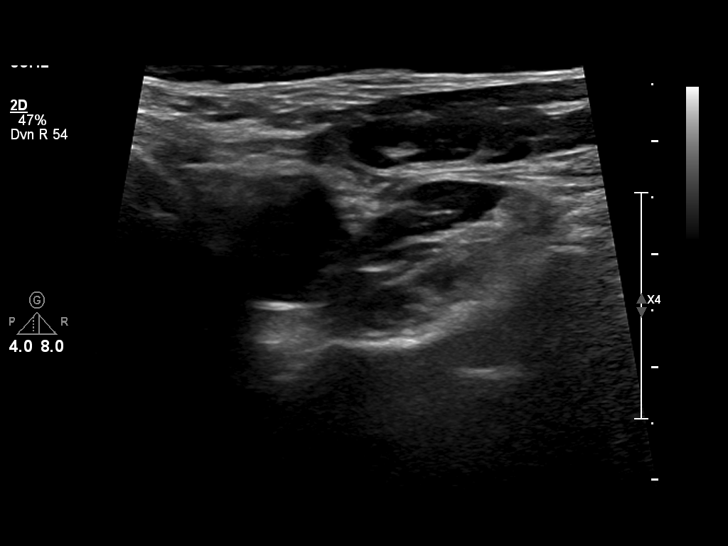
[im 30/43]
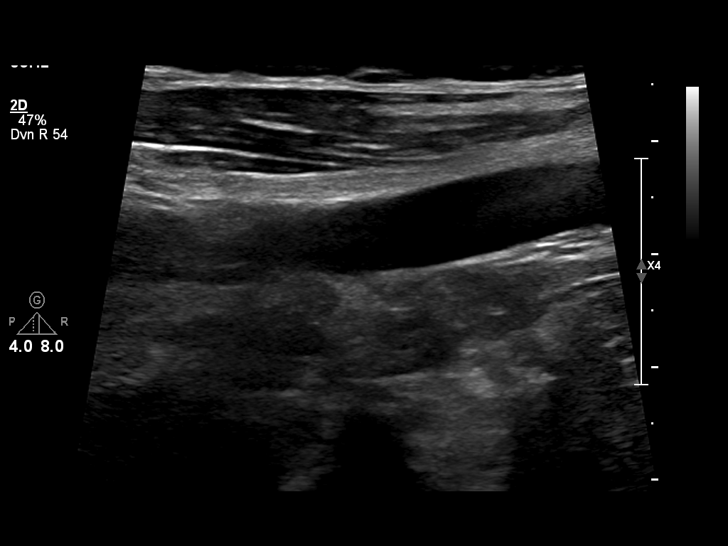
[im 33/43]
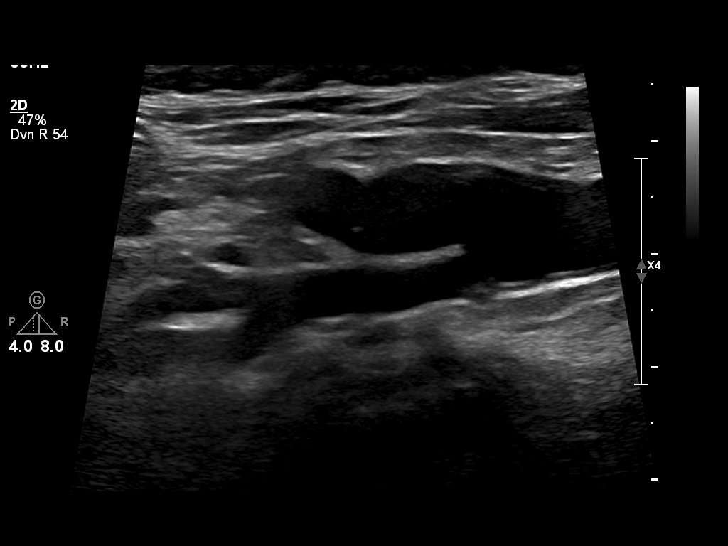
[im 35/43]
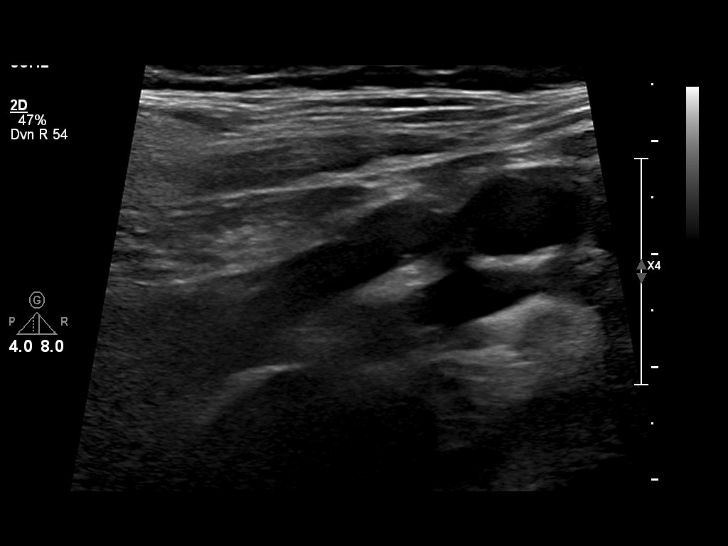
[im 39/43]
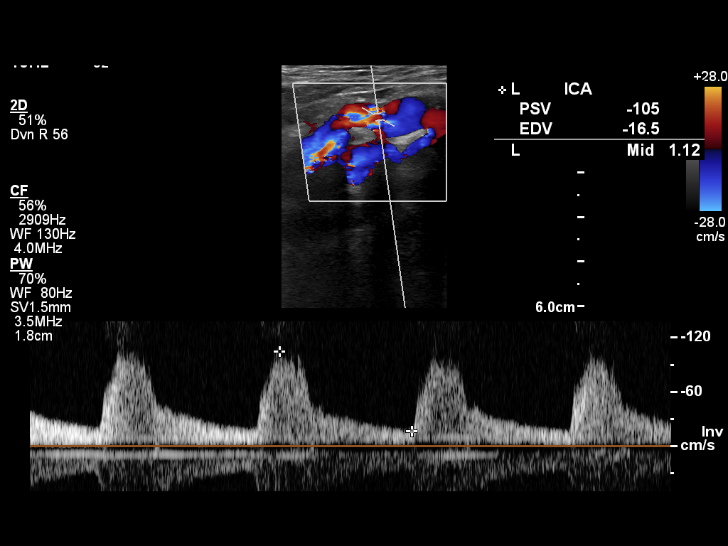
[im 43/43]
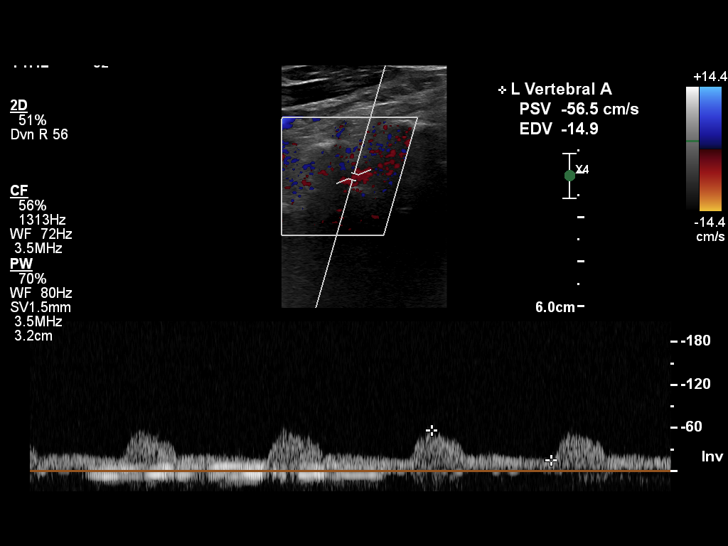

[14 of 24 positions shown; findings below may reference images not displayed]

FINDINGS: Right: There is mild  plaque identified within the right internal carotid bulb. The peak systolic velocity within the right internal carotid artery is 98  cm/sec with an IC/CC ratio of 1.4. There is antegrade flow within the right external carotid and right vertebral arteries.
Left: There is mild  plaque identified within the left internal carotid bulb. The peak systolic velocity within the left internal carotid artery is 134 cm/sec with an IC/CC ratio 1.4. There is antegrade flow within the left external carotid and left vertebral arteries.
IMPRESSION: No hemodynamically significant stenosis within the internal carotid arteries by Doppler spectral criteria. The estimated luminal stenosis present is less than 50% bilaterally.
Stenoses based upon velocity measurement correlations as defined by the Society of radiologists in Ultrasound Consensus Conference Radiology 9330; 229; 340-346

## 2022-03-13 ENCOUNTER — Ambulatory Visit
Admit: 2022-03-13 | Discharge: 2022-03-13 | Payer: BLUE CROSS/BLUE SHIELD | Attending: Physician Assistant | Primary: Physician Assistant

## 2022-03-13 DIAGNOSIS — I10 Essential (primary) hypertension: Secondary | ICD-10-CM

## 2022-03-13 MED ORDER — METOPROLOL SUCCINATE ER 50 MG PO TB24
50 MG | ORAL_TABLET | Freq: Every day | ORAL | 1 refills | Status: DC
Start: 2022-03-13 — End: 2023-06-19

## 2022-03-13 MED ORDER — FREESTYLE FREEDOM KIT
PACK | Freq: Every day | 0 refills | Status: AC
Start: 2022-03-13 — End: ?

## 2022-03-13 MED ORDER — LANCETS MISC
Freq: Every day | 5 refills | Status: AC
Start: 2022-03-13 — End: ?

## 2022-03-13 MED ORDER — LOSARTAN POTASSIUM 25 MG PO TABS
25 MG | ORAL_TABLET | Freq: Every day | ORAL | 1 refills | Status: DC
Start: 2022-03-13 — End: 2023-06-19

## 2022-03-13 MED ORDER — LEVOTHYROXINE SODIUM 50 MCG PO TABS
50 MCG | ORAL_TABLET | Freq: Every day | ORAL | 1 refills | Status: DC
Start: 2022-03-13 — End: 2023-06-19

## 2022-03-13 MED ORDER — BLOOD GLUCOSE TEST VI STRP
ORAL_STRIP | 5 refills | Status: AC
Start: 2022-03-13 — End: ?

## 2022-03-13 MED ORDER — METFORMIN HCL 1000 MG PO TABS
1000 MG | ORAL_TABLET | ORAL | 1 refills | Status: AC
Start: 2022-03-13 — End: ?

## 2022-03-13 MED ORDER — ATORVASTATIN CALCIUM 80 MG PO TABS
80 MG | ORAL_TABLET | Freq: Every day | ORAL | 1 refills | Status: DC
Start: 2022-03-13 — End: 2023-06-19

## 2022-03-13 MED ORDER — GLIPIZIDE 5 MG PO TABS
5 MG | ORAL_TABLET | Freq: Two times a day (BID) | ORAL | 1 refills | Status: DC
Start: 2022-03-13 — End: 2022-11-21

## 2022-03-13 NOTE — Progress Notes (Deleted)
1. "Have you been to the ER, urgent care clinic since your last visit?  Hospitalized since your last visit?" {Yes when where and reason for visit:42307}    2. "Have you seen or consulted any other health care providers outside of the [facility] since your last visit?" {Yes when where and reason for visit:42307}     3. For patients aged 70-75: Has the patient had a colonoscopy / FIT/ Cologuard? {yes when and where or no:45081}     If the patient is female:    4. For patients aged 60-74: Has the patient had a mammogram within the past 2 years? {yes when and where or no:45081}    5. For patients aged 21-65: Has the patient had a pap smear? {yes when and where or no:45081}    Chief Complaint   Patient presents with    Follow-up     Patient has not been taking meds constantly    Diabetes    Hypertension       Vitals:    03/13/22 1019   BP: 130/76   Pulse: 90   Resp: 18   Temp: 97 F (36.1 C)   SpO2: 97%

## 2022-03-13 NOTE — Progress Notes (Signed)
1. "Have you been to the ER, urgent care clinic since your last visit?  Hospitalized since your last visit?" No    2. "Have you seen or consulted any other health care providers outside of the [facility] since your last visit?" No     3. For patients aged 70-75: Has the patient had a colonoscopy / FIT/ Cologuard? No     If the patient is female:    4. For patients aged 47-74: Has the patient had a mammogram within the past 2 years? Yes - no Care Gap present    5. For patients aged 21-65: Has the patient had a pap smear? NA - based on age    Chief Complaint   Patient presents with    Follow-up     Patient has not been taking meds constantly    Diabetes    Hypertension       Vitals:    03/13/22 1019   BP: 130/76   Pulse: 90   Resp: 18   Temp: 97 F (36.1 C)   SpO2: 97%

## 2022-03-13 NOTE — Progress Notes (Signed)
Madison Peters is a 71 y.o. female who presents to the office today with the following:  Chief Complaint   Patient presents with    Follow-up     Patient has not been taking meds constantly    Diabetes    Hypertension       HPI  Pt has been out of all of her medications for about 3 weeks.  She has been non-compliant with f/up.    Pt presents for f/up T2DM, NID. W/ nephropathy.  Hemoglobin A1C   Date Value Ref Range Status   06/05/2021 12.0 (H) 4.0 - 5.6 % Final     Comment:     NEW METHOD  PLEASE NOTE NEW REFERENCE RANGE  (NOTE)  HbA1C Interpretive Ranges  <5.7              Normal  5.7 - 6.4         Consider Prediabetes  >6.5              Consider Diabetes     When seen at last visit pt had admitted to poor diet.  She was not compliant with f/up recommended at that visit and is now months late.  Previously we wanted to add [name], but pt instructed to notify if any insurance issues.  She says she did not get it and is sorry she did not call.  We did try to increase glipizide to BID.   She verbalized not wanting insulin.  She also admits she "has been bad but have not felt bad"  She admits she has not been consistent with her meds.    HTN  120s/60s at home.  No cardiac complaints.  She says her BP is still doing okay even though she has been out of her meds.    H/o hypothyroidism  Lab Results   Component Value Date    TSH 2.87 06/05/2021     Pt doing well on current statin therapy.  Denies med SEs or myalgias.  Is fasting today for labs.  Lab Results   Component Value Date/Time    CHOL 171 06/05/2021 09:30 AM    HDL 36 06/05/2021 09:30 AM    VLDL 43 07/23/2019 11:43 AM     Lab Results   Component Value Date/Time    ALT 31 06/05/2021 09:30 AM    AST 13 06/05/2021 09:30 AM     Cologuard sent twice.  Pt never received.  Given FOBT today instead.    COPD  Down from 3 packs to 10 cig per day.  No pulm complaints  Does not use inhalers    Anxiety controlled on fluoxetine.  No psych complaints/concerns today  Pt says she  feels well    Review of Systems    See HPI.    Past Medical History:   Diagnosis Date    Anxiety     Diabetes ([place])     Dyspepsia     Emphysema     Headache(784.0)     Hypertension     IGT (impaired glucose tolerance)     Kidney stones     Lipoma     Left calf    Thyroid disease     hypothryoidism       Past Surgical History:   Procedure Laterality Date    HEENT      Tonsillectomy at age 28       Allergies   Allergen Reactions    Ace Inhibitors Cough  Erythromycin Other (See Comments)     Does not remember.    Amoxicillin Nausea And Vomiting    Sulfa Antibiotics Nausea And Vomiting       Current Outpatient Medications   Medication Sig Dispense Refill    atorvastatin (LIPITOR) 80 MG tablet Take 1 tablet by mouth daily 90 tablet 1    glipiZIDE (GLUCOTROL) 5 MG tablet Take 1 tablet by mouth 2 times daily 180 tablet 1    levothyroxine (SYNTHROID) 50 MCG tablet Take 1 tablet by mouth daily 90 tablet 1    losartan (COZAAR) 25 MG tablet Take 1 tablet by mouth daily 90 tablet 1    metFORMIN (GLUCOPHAGE) 1000 MG tablet TAKE 1 & 1/2 TABLETS BY MOUTH EVERY MORNING AND 1 TABLET IN THE EVENING WITH A MEAL 180 tablet 1    metoprolol succinate (TOPROL XL) 50 MG extended release tablet Take 1 tablet by mouth daily 90 tablet 1    glucose monitoring (FREESTYLE FREEDOM) kit 1 kit by Does not apply route daily 1 kit 0    blood glucose monitor strips Test 3 times a day & as needed for symptoms of irregular blood glucose. Dispense sufficient amount for indicated testing frequency plus additional to accommodate PRN testing needs. 100 strip 5    Lancets MISC 1 each by Does not apply route daily 100 each 5    Lancets MISC Use as directed to check BS up to TID.      Lancets MISC Use for once daily BS checks      acetaminophen (TYLENOL) 500 MG tablet Take by mouth every 6 hours as needed      ascorbic acid (VITAMIN C) 500 MG tablet Take by mouth      aspirin 81 MG EC tablet Take by mouth daily      Cholecalciferol 50 MCG (2000 UT) TABS Take  by mouth      FLUoxetine (PROZAC) 20 MG capsule Take 1 capsule by mouth 2 times daily       No current facility-administered medications for this visit.       Social History     Socioeconomic History    Marital status: Married     Spouse name: None    Number of children: None    Years of education: None    Highest education level: None   Tobacco Use    Smoking status: Light Smoker     Packs/day: 1.00     Years: 55.00     Pack years: 55.00     Types: Cigarettes     Last attempt to quit: 01/28/2018     Years since quitting: 4.1     Passive exposure: Current    Smokeless tobacco: Never   Vaping Use    Vaping Use: Never used   Substance and Sexual Activity    Alcohol use: No    Drug use: No    Sexual activity: Not Currently     Partners: Male     Social Determinants of Health     Financial Resource Strain: Low Risk     Difficulty of Paying Living Expenses: Not hard at all   Food Insecurity: No Food Insecurity    Worried About Charity fundraiser in the Last Year: Never true    Ran Out of Food in the Last Year: Never true   Transportation Needs: Unknown    Lack of Transportation (Non-Medical): No   Housing Stability: Unknown    Unstable Housing in the  Last Year: No       Family History   Problem Relation Age of Onset    Heart Disease Mother     Other Father         AAA         Physical Exam:  Vitals:    03/13/22 1019   BP: 130/76   Pulse: 90   Resp: 18   Temp: 97 F (36.1 C)   SpO2: 97%     Physical Exam  Vitals and nursing note reviewed.   Constitutional:       Appearance: Normal appearance.      Comments: Thin WF   HENT:      Head: Normocephalic and atraumatic.   Cardiovascular:      Rate and Rhythm: Normal rate and regular rhythm.      Pulses: Normal pulses.      Heart sounds: Normal heart sounds.   Pulmonary:      Effort: Pulmonary effort is normal.      Breath sounds: Normal breath sounds.   Musculoskeletal:         General: No swelling.      Cervical back: Neck supple.   Skin:     General: Skin is warm and dry.    Neurological:      General: No focal deficit present.      Mental Status: She is alert and oriented to person, place, and time. Mental status is at baseline.   Psychiatric:         Mood and Affect: Mood normal.         Behavior: Behavior normal.       Assessment/Plan:   Diagnosis Orders   1. Primary hypertension  losartan (COZAAR) 25 MG tablet    metoprolol succinate (TOPROL XL) 50 MG extended release tablet    Comprehensive Metabolic Panel      2. Pulmonary emphysema, unspecified emphysema type ([place])        3. Type 2 diabetes with nephropathy (HCC)  glipiZIDE (GLUCOTROL) 5 MG tablet    losartan (COZAAR) 25 MG tablet    metFORMIN (GLUCOPHAGE) 1000 MG tablet    Hemoglobin A1C    glucose monitoring (FREESTYLE FREEDOM) kit    blood glucose monitor strips    Lancets MISC      4. Thyroid disease  levothyroxine (SYNTHROID) 50 MCG tablet    TSH      5. Hyperlipidemia, unspecified hyperlipidemia type  atorvastatin (LIPITOR) 80 MG tablet    Lipid Panel      6. Anxiety disorder, unspecified type        7. Encounter for immunization  Pneumococcal, PCV20, PREVNAR 47, (age 76 yrs+), IM, PF        Reiterated need for compliance and health risk involved.  Also encouraged ongoing efforts for smoking cessation.  Resume medication.  Resume home BS checks and bring in log at f/up visit.  Return in about 4 weeks (around 04/10/2022), or sooner prn.  Seek care in interim for any new sxs or other concerns.  Pt verbalizes understanding and agrees with the plan.    Juanna Cao, PA-C

## 2022-03-14 LAB — COMPREHENSIVE METABOLIC PANEL
ALT: 16 U/L (ref 12–78)
AST: 9 U/L — ABNORMAL LOW (ref 15–37)
Albumin/Globulin Ratio: 1.7 (ref 1.1–2.2)
Albumin: 4.3 g/dL (ref 3.5–5.0)
Alk Phosphatase: 85 U/L (ref 45–117)
Anion Gap: 10 mmol/L (ref 5–15)
BUN: 12 MG/DL (ref 6–20)
Bun/Cre Ratio: 17 (ref 12–20)
CO2: 25 mmol/L (ref 21–32)
Calcium: 9.1 MG/DL (ref 8.5–10.1)
Chloride: 99 mmol/L (ref 97–108)
Creatinine: 0.71 MG/DL (ref 0.55–1.02)
Est, Glom Filt Rate: >60 mL/min/{1.73_m2} (ref >60)
Globulin: 2.5 g/dL (ref 2.0–4.0)
Glucose: 381 mg/dL — ABNORMAL HIGH (ref 65–100)
Potassium: 4.3 mmol/L (ref 3.5–5.1)
Sodium: 134 mmol/L — ABNORMAL LOW (ref 136–145)
Total Bilirubin: 0.4 MG/DL (ref 0.2–1.0)
Total Protein: 6.8 g/dL (ref 6.4–8.2)

## 2022-03-14 LAB — LIPID PANEL
Chol/HDL Ratio: 6.8 — ABNORMAL HIGH (ref 0.0–5.0)
Cholesterol, Total: 203 MG/DL — ABNORMAL HIGH (ref <200)
HDL: 30 MG/DL
LDL Calculated: 101.8 MG/DL — ABNORMAL HIGH (ref 0–100)
Triglycerides: 356 MG/DL — ABNORMAL HIGH (ref <150)
VLDL Cholesterol Calculated: 71.2 MG/DL

## 2022-03-14 LAB — TSH: TSH, 3RD GENERATION: 2.67 u[IU]/mL (ref 0.36–3.74)

## 2022-03-14 LAB — HEMOGLOBIN A1C
Hemoglobin A1C: 12.6 % — ABNORMAL HIGH (ref 4.0–5.6)
eAG: 315 mg/dL

## 2022-04-23 ENCOUNTER — Ambulatory Visit: Payer: BLUE CROSS/BLUE SHIELD | Attending: Physician Assistant | Primary: Physician Assistant

## 2022-05-08 ENCOUNTER — Ambulatory Visit
Admit: 2022-05-08 | Discharge: 2022-05-08 | Payer: BLUE CROSS/BLUE SHIELD | Attending: Physician Assistant | Primary: Physician Assistant

## 2022-05-08 DIAGNOSIS — E1121 Type 2 diabetes mellitus with diabetic nephropathy: Secondary | ICD-10-CM

## 2022-05-08 LAB — AMB POC GLUCOSE, QUANTITATIVE, BLOOD: Glucose, POC: 319 mg/dL — AB (ref 70–100)

## 2022-05-08 MED ORDER — EMPAGLIFLOZIN 10 MG PO TABS
10 MG | ORAL_TABLET | Freq: Every day | ORAL | 0 refills | Status: AC
Start: 2022-05-08 — End: ?

## 2022-05-08 NOTE — Patient Instructions (Signed)
Patient Education        Type 2 Diabetes: Care Instructions  Your Care Instructions     Type 2 diabetes is a disease that develops when the body's tissues cannot use insulin properly. Over time, the pancreas cannot make enough insulin. Insulin is a hormone that helps the body's cells use sugar (glucose) for energy. It also helps the body store extra sugar in muscle, fat, and liver cells.  Without insulin, the sugar cannot get into the cells to do its work. It stays in the blood instead. This can cause high blood sugar levels. A person has diabetes when the blood sugar stays too high too much of the time. Over time, diabetes can lead to diseases of the heart, blood vessels, nerves, kidneys, and eyes.  You may be able to control your blood sugar by losing weight, eating a healthy diet, and getting daily exercise. You may also have to take insulin or other diabetes medicine.  Follow-up care is a key part of your treatment and safety. Be sure to make and go to all appointments. Call your doctor if you are having problems. It's also a good idea to know your test results and keep a list of the medicines you take.  How can you care for yourself at home?  Keep your blood sugar at a target level (which you set with your doctor).  Carbohydrate--the body's main source of fuel--affects blood sugar more than any other nutrient. Carbohydrate is in fruits, vegetables, milk, and yogurt. It also is in breads, cereals, vegetables such as potatoes and corn, and sugary foods such as candy and cakes. Follow your meal plan to know how much carbohydrate to eat at each meal and snack.  Aim for 30 minutes of exercise on most, preferably all, days of the week. Walking is a good choice. You also may want to do other activities, such as running, swimming, cycling, or playing tennis or team sports. Try to do muscle-strengthening exercises at least 2 times a week.  Take your medicines exactly as prescribed. Call your doctor if you think you are  having a problem with your medicine. You will get more details on the specific medicines your doctor prescribes.  Check your blood sugar as often as your doctor recommends. It is important to keep track of any symptoms you have, such as low blood sugar. Also tell your doctor if you have any changes in your activities, diet, or insulin use.  Talk to your doctor before you start taking aspirin every day. Aspirin can help certain people lower their risk of a heart attack or stroke. But taking aspirin isn't right for everyone, because it can cause serious bleeding.  Do not smoke. If you need help quitting, talk to your doctor about stop-smoking programs and medicines. These can increase your chances of quitting for good.  Keep your cholesterol and blood pressure at normal levels. You may need to take one or more medicines to reach your goals. Take them exactly as directed. Do not stop or change a medicine without talking to your doctor first.  When should you call for help?   Call 911 anytime you think you may need emergency care. For example, call if:    You passed out (lost consciousness), or you suddenly become very sleepy or confused. (You may have very low blood sugar.)   Call your doctor now or seek immediate medical care if:    Your blood sugar is 300 mg/dL or is higher than  the level your doctor has set for you.     You have symptoms of low blood sugar, such as:  Sweating.  Feeling nervous, shaky, and weak.  Extreme hunger and slight nausea.  Dizziness and headache.  Blurred vision.  Confusion.   Watch closely for changes in your health, and be sure to contact your doctor if:    You often have problems controlling your blood sugar.     You have symptoms of long-term diabetes problems, such as:  New vision changes.  New pain, numbness, or tingling in your hands or feet.  Skin problems.   Where can you learn more?  Go to https://www.healthwise.net/patientEd and enter C553 to learn more about "Type 2 Diabetes: Care  Instructions."  Current as of: October 31, 2021               Content Version: 13.8   2006-2023 [employer].   Care instructions adapted under license by [facility]. If you have questions about a medical condition or this instruction, always ask your healthcare professional. [employer] disclaims any warranty or liability for your use of this information.

## 2022-05-08 NOTE — Progress Notes (Signed)
Madison Peters is a 71 y.o. female who presents to the office today with the following:  Chief Complaint   Patient presents with    Follow-up     1 month    Hypertension    Diabetes       Hypertension    Diabetes    Pt with h/o non-compliance with medication and f/up here for ongoing management of her HTN and T2DM w/nephropathy.    Pt has not wanted to try insulin or IM meds.  She says Madison Peters was sent and she has been nauseated and vomited the two times she tried to take it, even with food. I do not have this Rx in her chart or my note. Pt will bring in bottle.    [name] was sent in the past but pt did not pick up, says too expensive.  Also tried to increase Glipizide.  Refills given in July, pt did not return last month as directed with BS readings.  She has not been checking her sugars at all.  Says she "needs to do better" with diet.  Has been eating ice cream.  Hemoglobin A1C   Date Value Ref Range Status   03/13/2022 12.6 (H) 4.0 - 5.6 % Final     Comment:     NEW METHOD  PLEASE NOTE NEW REFERENCE RANGE  (NOTE)  HbA1C Interpretive Ranges  <5.7              Normal  5.7 - 6.4         Consider Prediabetes  >6.5              Consider Diabetes       Have sent cologuard to pt twice and she says she never received either in the mail.  Given FOBT today to take home as pt does not want colonoscopy.    COPD stable and pt continues to cut back on smoking.  Anxiety has been controlled on fluoxetine w/o psych concerns today.  No other concerns. Feels well per pt.  Down to 8 cig per day.    Review of Systems    See HPI.    Past Medical History:   Diagnosis Date    Anxiety     Diabetes ([place])     Dyspepsia     Emphysema     Headache(784.0)     Hypertension     IGT (impaired glucose tolerance)     Kidney stones     Lipoma     Left calf    Thyroid disease     hypothryoidism       Past Surgical History:   Procedure Laterality Date    HEENT      Tonsillectomy at age 89       Allergies   Allergen Reactions    Ace Inhibitors Cough     Erythromycin Other (See Comments)     Does not remember.    Amoxicillin Nausea And Vomiting    Sulfa Antibiotics Nausea And Vomiting       Current Outpatient Medications   Medication Sig Dispense Refill    empagliflozin (JARDIANCE) 10 MG tablet Take 1 tablet by mouth daily 30 tablet 0    atorvastatin (LIPITOR) 80 MG tablet Take 1 tablet by mouth daily 90 tablet 1    glipiZIDE (GLUCOTROL) 5 MG tablet Take 1 tablet by mouth 2 times daily 180 tablet 1    levothyroxine (SYNTHROID) 50 MCG tablet Take 1 tablet by mouth daily  90 tablet 1    losartan (COZAAR) 25 MG tablet Take 1 tablet by mouth daily 90 tablet 1    metFORMIN (GLUCOPHAGE) 1000 MG tablet TAKE 1 & 1/2 TABLETS BY MOUTH EVERY MORNING AND 1 TABLET IN THE EVENING WITH A MEAL 180 tablet 1    metoprolol succinate (TOPROL XL) 50 MG extended release tablet Take 1 tablet by mouth daily 90 tablet 1    glucose monitoring (FREESTYLE FREEDOM) kit 1 kit by Does not apply route daily 1 kit 0    blood glucose monitor strips Test 3 times a day & as needed for symptoms of irregular blood glucose. Dispense sufficient amount for indicated testing frequency plus additional to accommodate PRN testing needs. 100 strip 5    Lancets MISC 1 each by Does not apply route daily 100 each 5    Lancets MISC Use as directed to check BS up to TID.      Lancets MISC Use for once daily BS checks      acetaminophen (TYLENOL) 500 MG tablet Take by mouth every 6 hours as needed      ascorbic acid (VITAMIN C) 500 MG tablet Take by mouth      aspirin 81 MG EC tablet Take by mouth daily      Cholecalciferol 50 MCG (2000 UT) TABS Take by mouth      FLUoxetine (PROZAC) 20 MG capsule Take 1 capsule by mouth 2 times daily       No current facility-administered medications for this visit.       Social History     Socioeconomic History    Marital status: Married     Spouse name: None    Number of children: None    Years of education: None    Highest education level: None   Tobacco Use    Smoking status:  Light Smoker     Packs/day: 1.00     Years: 55.00     Pack years: 55.00     Types: Cigarettes     Last attempt to quit: 01/28/2018     Years since quitting: 4.2     Passive exposure: Current    Smokeless tobacco: Never   Vaping Use    Vaping Use: Never used   Substance and Sexual Activity    Alcohol use: No    Drug use: No    Sexual activity: Not Currently     Partners: Male     Social Determinants of Health     Financial Resource Strain: Low Risk     Difficulty of Paying Living Expenses: Not hard at all   Food Insecurity: No Food Insecurity    Worried About Charity fundraiser in the Last Year: Never true    Ran Out of Food in the Last Year: Never true   Transportation Needs: Unknown    Lack of Transportation (Non-Medical): No   Housing Stability: Unknown    Unstable Housing in the Last Year: No       Family History   Problem Relation Age of Onset    Heart Disease Mother     Other Father         AAA         Physical Exam:  Vitals:    05/08/22 1024   BP: 136/74   Pulse: 70   Resp: 18   Temp: 97 F (36.1 C)   SpO2: 97%     Physical Exam  Vitals and nursing note reviewed.  Constitutional:       Appearance: Normal appearance.   HENT:      Head: Normocephalic and atraumatic.   Cardiovascular:      Rate and Rhythm: Normal rate and regular rhythm.      Pulses: Normal pulses.      Heart sounds: Normal heart sounds.   Pulmonary:      Effort: Pulmonary effort is normal.      Breath sounds: Normal breath sounds.      Comments: Occasional wet sounding cough, some wheeze on exp during auscultation  Musculoskeletal:      Cervical back: Neck supple.   Skin:     General: Skin is warm and dry.   Neurological:      General: No focal deficit present.      Mental Status: She is alert and oriented to person, place, and time. Mental status is at baseline.   Psychiatric:         Mood and Affect: Mood normal.         Behavior: Behavior normal.       Assessment/Plan:   Diagnosis Orders   1. Type 2 diabetes with nephropathy (HCC)  AMB POC  GLUCOSE, QUANTITATIVE, BLOOD    empagliflozin (JARDIANCE) 10 MG tablet      2. Primary hypertension        3. Pulmonary emphysema, unspecified emphysema type ([place])  Continue to work on smoking cessation.  Pt says she feels her breathing is stable.        Results for orders placed or performed in visit on 05/08/22   AMB POC GLUCOSE, QUANTITATIVE, BLOOD   Result Value Ref Range    Glucose, POC 319 (A) 70 - 100 mg/dL     Pt promises to work on diet and monitor BS and keep log for f/up.  Continue other meds. Will try to add jardiance. She will let me know if any issues getting this Rx asap (not wait until next apt)  Return in 2 weeks with sugar log.  Other conditions appear stable. Continue other meds.  RTC sooner/Seek care in interim for any new sxs or other concerns.  Pt verbalizes understanding and agrees with the plan.    Nicole Cella, PA-C

## 2022-05-08 NOTE — Progress Notes (Signed)
"  Have you been to the ER, urgent care clinic since your last visit?  Hospitalized since your last visit?"    NO    "Have you seen or consulted any other health care providers outside of [facility] since your last visit?"    NO    "Have you had a colorectal cancer screening such as a colonoscopy/FIT/Cologuard?    NO  Nurse/CMA to request records if not in the chart

## 2022-05-22 ENCOUNTER — Ambulatory Visit: Payer: BLUE CROSS/BLUE SHIELD | Attending: Physician Assistant | Primary: Physician Assistant

## 2022-05-29 ENCOUNTER — Ambulatory Visit
Admit: 2022-05-29 | Discharge: 2022-05-29 | Payer: BLUE CROSS/BLUE SHIELD | Attending: Physician Assistant | Primary: Physician Assistant

## 2022-05-29 DIAGNOSIS — E1121 Type 2 diabetes mellitus with diabetic nephropathy: Secondary | ICD-10-CM

## 2022-05-29 NOTE — Patient Instructions (Addendum)
Increase Metformin to 1000 mg twice daily (may take two of the 500 mg tablets).  May try omeprazole (prilosec) 20 mg in AM once daily 30 minutes before breakfast.  Monitor sugars, keep log.  F/up in 2 weeks.

## 2022-05-29 NOTE — Progress Notes (Signed)
Madison Peters is a 71 y.o. female who presents to the office today with the following:  Chief Complaint   Patient presents with    Follow-up    Diabetes       HPI  Pt brought in meds.  Realized only taking 500 mg metformin daily, rather than max dosage.  Did not get the fargixa, says she resumed the glipizide instead, did not think she could take it before (not sure why).  Has been trying to check BS but has not often.  When she checks it has been 170-190.    Hemoglobin A1C   Date Value Ref Range Status   03/13/2022 12.6 (H) 4.0 - 5.6 % Final     Comment:     NEW METHOD  PLEASE NOTE NEW REFERENCE RANGE  (NOTE)  HbA1C Interpretive Ranges  <5.7              Normal  5.7 - 6.4         Consider Prediabetes  >6.5              Consider Diabetes       Also says she has not been able to eat much.  Has not had much of an appetite for a month now.  She has not had any n/v/d.  Thinks maybe she just feels full quickly.   Occasional heartburn, no other indigestion problems.  She is unsure if this is from a medication, but thinks it started before any changes.  Lost 3 lbs.  Was not having issues last lab when checked in July.  Otherwise denies other symptoms.      Review of Systems   Constitutional:  Positive for appetite change. Negative for activity change, chills, diaphoresis, fatigue and fever.   HENT: Negative.     Respiratory: Negative.     Cardiovascular: Negative.    Gastrointestinal: Negative.    Genitourinary: Negative.    Musculoskeletal: Negative.    Neurological: Negative.    Psychiatric/Behavioral: Negative.         See HPI.    Past Medical History:   Diagnosis Date    Anxiety     Diabetes ([place])     Dyspepsia     Emphysema     Headache(784.0)     Hypertension     IGT (impaired glucose tolerance)     Kidney stones     Lipoma     Left calf    Thyroid disease     hypothryoidism       Past Surgical History:   Procedure Laterality Date    HEENT      Tonsillectomy at age 61       Allergies   Allergen Reactions    Ace  Inhibitors Cough    Erythromycin Other (See Comments)     Does not remember.    Amoxicillin Nausea And Vomiting    Sulfa Antibiotics Nausea And Vomiting       Current Outpatient Medications   Medication Sig Dispense Refill    atorvastatin (LIPITOR) 80 MG tablet Take 1 tablet by mouth daily 90 tablet 1    glipiZIDE (GLUCOTROL) 5 MG tablet Take 1 tablet by mouth 2 times daily 180 tablet 1    levothyroxine (SYNTHROID) 50 MCG tablet Take 1 tablet by mouth daily 90 tablet 1    losartan (COZAAR) 25 MG tablet Take 1 tablet by mouth daily 90 tablet 1    metFORMIN (GLUCOPHAGE) 1000 MG tablet TAKE 1 &  1/2 TABLETS BY MOUTH EVERY MORNING AND 1 TABLET IN THE EVENING WITH A MEAL (Patient taking differently: 0.5 tablets TAKE 1 & 1/2 TABLETS BY MOUTH EVERY MORNING AND 1 TABLET IN THE EVENING WITH A MEAL) 180 tablet 1    metoprolol succinate (TOPROL XL) 50 MG extended release tablet Take 1 tablet by mouth daily 90 tablet 1    glucose monitoring (FREESTYLE FREEDOM) kit 1 kit by Does not apply route daily 1 kit 0    blood glucose monitor strips Test 3 times a day & as needed for symptoms of irregular blood glucose. Dispense sufficient amount for indicated testing frequency plus additional to accommodate PRN testing needs. 100 strip 5    Lancets MISC 1 each by Does not apply route daily 100 each 5    Lancets MISC Use as directed to check BS up to TID.      Lancets MISC Use for once daily BS checks      acetaminophen (TYLENOL) 500 MG tablet Take by mouth every 6 hours as needed      aspirin 81 MG EC tablet Take by mouth daily      Cholecalciferol 50 MCG (2000 UT) TABS Take by mouth      FLUoxetine (PROZAC) 20 MG capsule Take 1 capsule by mouth 2 times daily       No current facility-administered medications for this visit.       Social History     Socioeconomic History    Marital status: Married     Spouse name: None    Number of children: None    Years of education: None    Highest education level: None   Tobacco Use    Smoking status:  Light Smoker     Packs/day: 1.00     Years: 55.00     Additional pack years: 0.00     Total pack years: 55.00     Types: Cigarettes     Last attempt to quit: 01/28/2018     Years since quitting: 4.3     Passive exposure: Current    Smokeless tobacco: Never   Vaping Use    Vaping Use: Never used   Substance and Sexual Activity    Alcohol use: No    Drug use: No    Sexual activity: Not Currently     Partners: Male     Social Determinants of Health     Financial Resource Strain: Low Risk  (05/08/2022)    Overall Financial Resource Strain (CARDIA)     Difficulty of Paying Living Expenses: Not hard at all   Food Insecurity: No Food Insecurity (05/08/2022)    Hunger Vital Sign     Worried About Running Out of Food in the Last Year: Never true     [place] in the Last Year: Never true   Transportation Needs: Unknown (05/08/2022)    PRAPARE - Transportation     Lack of Transportation (Non-Medical): No   Housing Stability: Unknown (05/08/2022)    Housing Stability Vital Sign     Unstable Housing in the Last Year: No       Family History   Problem Relation Age of Onset    Heart Disease Mother     Other Father         AAA         Physical Exam:  Vitals:    05/29/22 1057   BP: 139/69   Pulse: 69   Resp: 18  SpO2: 98%     Physical Exam  Vitals and nursing note reviewed.   Constitutional:       Appearance: Normal appearance.   HENT:      Head: Normocephalic and atraumatic.   Cardiovascular:      Rate and Rhythm: Normal rate and regular rhythm.      Pulses: Normal pulses.      Heart sounds: Normal heart sounds.   Pulmonary:      Effort: Pulmonary effort is normal.      Breath sounds: Normal breath sounds.   Musculoskeletal:      Cervical back: Neck supple.   Skin:     General: Skin is warm and dry.   Neurological:      General: No focal deficit present.      Mental Status: She is alert and oriented to person, place, and time. Mental status is at baseline.   Psychiatric:         Mood and Affect: Mood normal.         Behavior:  Behavior normal.         Assessment/Plan:   Diagnosis Orders   1. Type 2 diabetes with nephropathy (HCC)  PR COLLECTION VENOUS BLOOD VENIPUNCTURE      2. Decreased appetite  CBC with Auto Differential    TSH    Comprehensive Metabolic Panel    Comprehensive Metabolic Panel    TSH    CBC with Auto Differential      3. Heartburn          Pt will be notified of results and any further instructions.  Recommend trial of prilosec.  Discussed trying to eat small meals throughout the day.  Keep BS log.  Go up to 1000 mg BID Metformin.  F/up in 2 weeks.  Pt would like to hold off on GI referral until then.  She agrees to seek care in interim for any new sxs or other concerns.  Pt verbalizes understanding and agrees with the plan.    Nicole Cella, PA-C

## 2022-05-30 LAB — CBC WITH AUTO DIFFERENTIAL
Absolute Immature Granulocyte: 0 10*3/uL (ref 0.00–0.04)
Basophils %: 1 % (ref 0–1)
Basophils Absolute: 0 10*3/uL (ref 0.0–0.1)
Eosinophils %: 0 % (ref 0–7)
Eosinophils Absolute: 0 10*3/uL (ref 0.0–0.4)
Hematocrit: 44.9 % (ref 35.0–47.0)
Hemoglobin: 14.9 g/dL (ref 11.5–16.0)
Immature Granulocytes: 0 % (ref 0.0–0.5)
Lymphocytes %: 13 % (ref 12–49)
Lymphocytes Absolute: 0.9 10*3/uL (ref 0.8–3.5)
MCH: 29.7 PG (ref 26.0–34.0)
MCHC: 33.2 g/dL (ref 30.0–36.5)
MCV: 89.4 FL (ref 80.0–99.0)
MPV: 10.7 FL (ref 8.9–12.9)
Monocytes %: 6 % (ref 5–13)
Monocytes Absolute: 0.4 10*3/uL (ref 0.0–1.0)
Neutrophils %: 80 % — ABNORMAL HIGH (ref 32–75)
Neutrophils Absolute: 5.6 10*3/uL (ref 1.8–8.0)
Nucleated RBCs: 0 PER 100 WBC
Platelets: 229 10*3/uL (ref 150–400)
RBC: 5.02 M/uL (ref 3.80–5.20)
RDW: 12.9 % (ref 11.5–14.5)
WBC: 7 10*3/uL (ref 3.6–11.0)
nRBC: 0 10*3/uL (ref 0.00–0.01)

## 2022-05-30 LAB — COMPREHENSIVE METABOLIC PANEL
ALT: 19 U/L (ref 12–78)
AST: 8 U/L — ABNORMAL LOW (ref 15–37)
Albumin/Globulin Ratio: 1.5 (ref 1.1–2.2)
Albumin: 4.4 g/dL (ref 3.5–5.0)
Alk Phosphatase: 89 U/L (ref 45–117)
Anion Gap: 5 mmol/L (ref 5–15)
BUN: 8 MG/DL (ref 6–20)
Bun/Cre Ratio: 10 — ABNORMAL LOW (ref 12–20)
CO2: 26 mmol/L (ref 21–32)
Calcium: 8.9 MG/DL (ref 8.5–10.1)
Chloride: 103 mmol/L (ref 97–108)
Creatinine: 0.77 MG/DL (ref 0.55–1.02)
Est, Glom Filt Rate: >60 mL/min/{1.73_m2} (ref >60)
Globulin: 2.9 g/dL (ref 2.0–4.0)
Glucose: 259 mg/dL — ABNORMAL HIGH (ref 65–100)
Potassium: 3.9 mmol/L (ref 3.5–5.1)
Sodium: 134 mmol/L — ABNORMAL LOW (ref 136–145)
Total Bilirubin: 0.5 MG/DL (ref 0.2–1.0)
Total Protein: 7.3 g/dL (ref 6.4–8.2)

## 2022-05-30 LAB — TSH: TSH, 3RD GENERATION: 1.95 u[IU]/mL (ref 0.36–3.74)

## 2022-06-12 ENCOUNTER — Ambulatory Visit
Admit: 2022-06-12 | Discharge: 2022-06-12 | Payer: BLUE CROSS/BLUE SHIELD | Attending: Physician Assistant | Primary: Physician Assistant

## 2022-06-12 DIAGNOSIS — E1121 Type 2 diabetes mellitus with diabetic nephropathy: Secondary | ICD-10-CM

## 2022-06-12 MED ORDER — EMPAGLIFLOZIN 10 MG PO TABS
10 MG | ORAL_TABLET | Freq: Every day | ORAL | 0 refills | Status: AC
Start: 2022-06-12 — End: 2022-11-21

## 2022-06-12 MED ORDER — NEOMYCIN-POLYMYXIN-HC 3.5-10000-1 OT SUSP
Freq: Four times a day (QID) | OTIC | 0 refills | Status: AC
Start: 2022-06-12 — End: 2022-06-22

## 2022-06-12 NOTE — Progress Notes (Signed)
"  Have you been to the ER, urgent care clinic since your last visit?  Hospitalized since your last visit?"    NO    "Have you seen or consulted any other health care providers outside of [facility] since your last visit?"    NO    "Have you had a colorectal cancer screening such as a colonoscopy/FIT/Cologuard?    NO  Nurse/CMA to request records if not in the chart

## 2022-06-12 NOTE — Progress Notes (Signed)
Madison Peters is a 71 y.o. female who presents to the office today with the following:  Chief Complaint   Patient presents with    Follow-up    Diabetes    Pharyngitis    Otalgia       Pt has been monitoring her BS but d/c her Metformin as she has been feeling sick since yesterday.   Says her throat is sore and ears hurt, Left>right. No hearing loss, drainage, or swelling noted.  Coughing up yellow phlegm "but not bad" and sometimes non-productive.  No sob/wheeze/cp, HA, dizziness.  No sick contacts.  No recent travel.   No other new complaints.  Has been taking Tylenol which she feels helps.  Says she feels she may have a mild cold and is not really concerned.  She does have COPD and continues to smoke (has cut back) and says her breathing is fine right now, no worse since the cough started.    T2DM  Had to stop her Metformin due to SE n/v/d. Says she can no longer tolerate. D/c 4 d ago.  Is tolerating Glipizide fine 5 mg BID. Tolerates well.  Her BS recently have been 170-190s.  Was not able to get dapagliflozin but does not recall why.  Does not want injectables.  Hemoglobin A1C   Date Value Ref Range Status   03/13/2022 12.6 (H) 4.0 - 5.6 % Final     Comment:     NEW METHOD  PLEASE NOTE NEW REFERENCE RANGE  (NOTE)  HbA1C Interpretive Ranges  <5.7              Normal  5.7 - 6.4         Consider Prediabetes  >6.5              Consider Diabetes       Review of Systems   HENT:  Positive for ear pain.        See HPI.    Past Medical History:   Diagnosis Date    Anxiety     Diabetes ([place])     Dyspepsia     Emphysema     Headache(784.0)     Hypertension     IGT (impaired glucose tolerance)     Kidney stones     Lipoma     Left calf    Thyroid disease     hypothryoidism       Past Surgical History:   Procedure Laterality Date    HEENT      Tonsillectomy at age 64       Allergies   Allergen Reactions    Ace Inhibitors Cough    Erythromycin Other (See Comments)     Does not remember.    Amoxicillin Nausea And Vomiting     Sulfa Antibiotics Nausea And Vomiting       Current Outpatient Medications   Medication Sig Dispense Refill    empagliflozin (JARDIANCE) 10 MG tablet Take 1 tablet by mouth daily 30 tablet 0    neomycin-polymyxin-hydrocortisone (CORTISPORIN) 3.5-10000-1 otic suspension Place 4 drops into both ears 4 times daily for 10 days 10 mL 0    atorvastatin (LIPITOR) 80 MG tablet Take 1 tablet by mouth daily 90 tablet 1    glipiZIDE (GLUCOTROL) 5 MG tablet Take 1 tablet by mouth 2 times daily 180 tablet 1    levothyroxine (SYNTHROID) 50 MCG tablet Take 1 tablet by mouth daily 90 tablet 1    losartan (COZAAR) 25  MG tablet Take 1 tablet by mouth daily 90 tablet 1    metoprolol succinate (TOPROL XL) 50 MG extended release tablet Take 1 tablet by mouth daily 90 tablet 1    glucose monitoring (FREESTYLE FREEDOM) kit 1 kit by Does not apply route daily 1 kit 0    blood glucose monitor strips Test 3 times a day & as needed for symptoms of irregular blood glucose. Dispense sufficient amount for indicated testing frequency plus additional to accommodate PRN testing needs. 100 strip 5    Lancets MISC 1 each by Does not apply route daily 100 each 5    acetaminophen (TYLENOL) 500 MG tablet Take by mouth every 6 hours as needed      aspirin 81 MG EC tablet Take by mouth daily      Cholecalciferol 50 MCG (2000 UT) TABS Take by mouth      FLUoxetine (PROZAC) 20 MG capsule Take 1 capsule by mouth 2 times daily       No current facility-administered medications for this visit.       Social History     Socioeconomic History    Marital status: Married     Spouse name: None    Number of children: None    Years of education: None    Highest education level: None   Tobacco Use    Smoking status: Light Smoker     Packs/day: 1.00     Years: 55.00     Additional pack years: 0.00     Total pack years: 55.00     Types: Cigarettes     Last attempt to quit: 01/28/2018     Years since quitting: 4.3     Passive exposure: Current    Smokeless tobacco: Never    Vaping Use    Vaping Use: Never used   Substance and Sexual Activity    Alcohol use: No    Drug use: No    Sexual activity: Not Currently     Partners: Male     Social Determinants of Health     Financial Resource Strain: Low Risk  (05/08/2022)    Overall Financial Resource Strain (CARDIA)     Difficulty of Paying Living Expenses: Not hard at all   Food Insecurity: No Food Insecurity (05/08/2022)    Hunger Vital Sign     Worried About Running Out of Food in the Last Year: Never true     Ran Out of Food in the Last Year: Never true   Transportation Needs: Unknown (05/08/2022)    PRAPARE - Transportation     Lack of Transportation (Non-Medical): No   Housing Stability: Unknown (05/08/2022)    Housing Stability Vital Sign     Unstable Housing in the Last Year: No       Family History   Problem Relation Age of Onset    Heart Disease Mother     Other Father         AAA         Physical Exam:  Vitals:    06/12/22 1020   BP: 135/81   Pulse: 88   Resp: 18   SpO2: 98%     Physical Exam  Vitals and nursing note reviewed.   Constitutional:       Appearance: Normal appearance.   HENT:      Head: Normocephalic and atraumatic.   Cardiovascular:      Rate and Rhythm: Normal rate and regular rhythm.  Pulses: Normal pulses.      Heart sounds: Normal heart sounds.   Pulmonary:      Effort: Pulmonary effort is normal.      Breath sounds: Normal breath sounds.   Musculoskeletal:         General: No swelling.      Cervical back: Neck supple.   Skin:     General: Skin is warm and dry.   Neurological:      General: No focal deficit present.      Mental Status: She is alert and oriented to person, place, and time. Mental status is at baseline.   Psychiatric:         Mood and Affect: Mood normal.         Behavior: Behavior normal.         Assessment/Plan:   Diagnosis Orders   1. Type 2 diabetes with nephropathy (HCC)  empagliflozin (JARDIANCE) 10 MG tablet  Pt to d/c metformin. Continue glipizide.  Will let me know if able to get new Rx of  empagliflozin, if not will check with her insurance for formulary alternatives.  She will continue to work on LM and monitor her BS for next visit.      2. Otalgia of both ears  neomycin-polymyxin-hydrocortisone (CORTISPORIN) 3.5-10000-1 otic suspension      3. Acute otitis externa of both ears, unspecified type  ??? Some white debri seen in ears and pt reports pain, but also has sore throat may be radiating and said she had just taken a shower and believes she got hair product in her ears.. sent Rx to use if ear pain persists/worsens prior to apt next wk neomycin-polymyxin-hydrocortisone (CORTISPORIN) 3.5-10000-1 otic suspension        F/up in 1 week with BS and to recheck today's complaints.  She will notify if any new/worsening symptoms in interim.  RTC/Seek care in interim for any new sxs or other concerns.  Pt verbalizes understanding and agrees with the plan.    Nicole Cella, PA-C

## 2022-06-13 ENCOUNTER — Telehealth

## 2022-06-13 MED ORDER — OFLOXACIN 0.3 % OT SOLN
0.3 % | Freq: Two times a day (BID) | OTIC | 0 refills | Status: AC
Start: 2022-06-13 — End: 2022-06-23

## 2022-06-13 NOTE — Telephone Encounter (Signed)
Patient states she cannot afford the Jardiance ($564.00) or the eye drops ($65.00) that where prescribed to her.  Please call her to discuss other alternatives at [phone]

## 2022-06-13 NOTE — Telephone Encounter (Signed)
Sent a different ear drop.  D/w pt already if the jardiance is too pricey to check with insurance formulary for other covered drugs for diabetes.

## 2022-06-19 ENCOUNTER — Ambulatory Visit
Admit: 2022-06-19 | Discharge: 2022-06-19 | Payer: BLUE CROSS/BLUE SHIELD | Attending: Physician Assistant | Primary: Physician Assistant

## 2022-06-19 DIAGNOSIS — H6123 Impacted cerumen, bilateral: Secondary | ICD-10-CM

## 2022-06-19 DIAGNOSIS — E1121 Type 2 diabetes mellitus with diabetic nephropathy: Secondary | ICD-10-CM

## 2022-06-19 LAB — AMB POC HEMOGLOBIN A1C: Hemoglobin A1C, POC: 9.3 %

## 2022-06-19 MED ORDER — PIOGLITAZONE HCL 30 MG PO TABS
30 MG | ORAL_TABLET | Freq: Every day | ORAL | 0 refills | Status: AC
Start: 2022-06-19 — End: 2022-11-21

## 2022-06-19 NOTE — Progress Notes (Signed)
Madison Peters is a 71 y.o. female who presents to the office today with the following:  Chief Complaint   Patient presents with    Follow-up    Diabetes    Otalgia     Checks ears       Diabetes    Otalgia       Pt presents for recheck BS and ears after here last week for T2DM f/up and otalgia/mild URI complaints.    T2DM  Previously pt d/c Metformin due to nausea. Was doing well on Glipizide as far as tolerance but BS still up, pt reports last FBS in the 160s but did not provide a log.  Rx sent for empagliflozin (Jardiance) after unable to get dapagliflozin (pt thinks cost related but not sure) and pt currently unwilling to do any injectable medications.   She was also not able to get the Jardiance due to cost.  Last POC a1c not done as pt a day too early for insurance to cover. Will need that today. Other labs done last month.  Hemoglobin A1C   Date Value Ref Range Status   03/13/2022 12.6 (H) 4.0 - 5.6 % Final     Comment:     NEW METHOD  PLEASE NOTE NEW REFERENCE RANGE  (NOTE)  HbA1C Interpretive Ranges  <5.7              Normal  5.7 - 6.4         Consider Prediabetes  >6.5              Consider Diabetes       Lab Results   Component Value Date    CREATININE 0.77 05/29/2022       Pt also given drops for ears but had insurance coverage issues and did not know another was sent.  She says she purchased otc drops and that has helped, ears also no longer painful but thinks she still may have wax.  Last visit concerned of white debri with EOM vs hair product pt says she may have gotten in her ears showing just prior to that apt.     Pt otherwise reports feeling well with no other complaints or acute concerns.    Review of Systems   HENT:  Positive for ear pain.        See HPI.    Past Medical History:   Diagnosis Date    Anxiety     Diabetes ([place])     Dyspepsia     Emphysema     Headache(784.0)     Hypertension     IGT (impaired glucose tolerance)     Kidney stones     Lipoma     Left calf    Thyroid disease      hypothryoidism       Past Surgical History:   Procedure Laterality Date    HEENT      Tonsillectomy at age 73       Allergies   Allergen Reactions    Ace Inhibitors Cough    Erythromycin Other (See Comments)     Does not remember.    Amoxicillin Nausea And Vomiting    Sulfa Antibiotics Nausea And Vomiting       Current Outpatient Medications   Medication Sig Dispense Refill    pioglitazone (ACTOS) 30 MG tablet Take 1 tablet by mouth daily 30 tablet 0    atorvastatin (LIPITOR) 80 MG tablet Take 1 tablet by mouth daily 90  tablet 1    glipiZIDE (GLUCOTROL) 5 MG tablet Take 1 tablet by mouth 2 times daily 180 tablet 1    levothyroxine (SYNTHROID) 50 MCG tablet Take 1 tablet by mouth daily 90 tablet 1    losartan (COZAAR) 25 MG tablet Take 1 tablet by mouth daily 90 tablet 1    metoprolol succinate (TOPROL XL) 50 MG extended release tablet Take 1 tablet by mouth daily 90 tablet 1    glucose monitoring (FREESTYLE FREEDOM) kit 1 kit by Does not apply route daily 1 kit 0    blood glucose monitor strips Test 3 times a day & as needed for symptoms of irregular blood glucose. Dispense sufficient amount for indicated testing frequency plus additional to accommodate PRN testing needs. 100 strip 5    Lancets MISC 1 each by Does not apply route daily 100 each 5    acetaminophen (TYLENOL) 500 MG tablet Take by mouth every 6 hours as needed      aspirin 81 MG EC tablet Take by mouth daily      Cholecalciferol 50 MCG (2000 UT) TABS Take by mouth      FLUoxetine (PROZAC) 20 MG capsule Take 1 capsule by mouth 2 times daily      ofloxacin (FLOXIN) 0.3 % otic solution Place 5 drops into both ears 2 times daily for 10 days (Patient not taking: Reported on 06/19/2022) 5 mL 0    empagliflozin (JARDIANCE) 10 MG tablet Take 1 tablet by mouth daily (Patient not taking: Reported on 06/19/2022) 30 tablet 0    neomycin-polymyxin-hydrocortisone (CORTISPORIN) 3.5-10000-1 otic suspension Place 4 drops into both ears 4 times daily for 10 days  (Patient not taking: Reported on 06/19/2022) 10 mL 0     No current facility-administered medications for this visit.       Social History     Socioeconomic History    Marital status: Married     Spouse name: None    Number of children: None    Years of education: None    Highest education level: None   Tobacco Use    Smoking status: Light Smoker     Packs/day: 1.00     Years: 55.00     Additional pack years: 0.00     Total pack years: 55.00     Types: Cigarettes     Last attempt to quit: 01/28/2018     Years since quitting: 4.3     Passive exposure: Current    Smokeless tobacco: Never   Vaping Use    Vaping Use: Never used   Substance and Sexual Activity    Alcohol use: No    Drug use: No    Sexual activity: Not Currently     Partners: Male     Social Determinants of Health     Financial Resource Strain: Low Risk  (05/08/2022)    Overall Financial Resource Strain (CARDIA)     Difficulty of Paying Living Expenses: Not hard at all   Food Insecurity: No Food Insecurity (05/08/2022)    Hunger Vital Sign     Worried About Running Out of Food in the Last Year: Never true     [place] in the Last Year: Never true   Transportation Needs: Unknown (05/08/2022)    PRAPARE - Transportation     Lack of Transportation (Non-Medical): No   Housing Stability: Unknown (05/08/2022)    Housing Stability Vital Sign     Unstable Housing in the Last Year:  No       Family History   Problem Relation Age of Onset    Heart Disease Mother     Other Father         AAA         Physical Exam:  Vitals:    06/19/22 1031   BP: (!) 144/90   Site: Right Upper Arm   Position: Sitting   Cuff Size: Large Adult   Pulse: 70   Resp: 18   SpO2: 97%   Weight: 146 lb (66.2 kg)   Height: '5\' 7"'  (1.702 m)     Physical Exam  Vitals and nursing note reviewed.   Constitutional:       Appearance: Normal appearance.   HENT:      Head: Normocephalic and atraumatic.      Right Ear: There is impacted cerumen.      Left Ear: There is impacted cerumen.      Nose: Nose  normal.   Eyes:      General:         Left eye: Left eye discharge: cerumen.     Conjunctiva/sclera: Conjunctivae normal.   Cardiovascular:      Rate and Rhythm: Normal rate and regular rhythm.      Pulses: Normal pulses.      Heart sounds: Normal heart sounds.   Pulmonary:      Effort: Pulmonary effort is normal.      Breath sounds: Normal breath sounds.   Musculoskeletal:         General: No swelling.      Cervical back: Neck supple.   Skin:     General: Skin is warm and dry.   Neurological:      General: No focal deficit present.      Mental Status: She is alert and oriented to person, place, and time. Mental status is at baseline.   Psychiatric:         Mood and Affect: Mood normal.         Behavior: Behavior normal.         Assessment/Plan:   Diagnosis Orders   1. Type 2 diabetes with nephropathy (HCC)  AMB POC HEMOGLOBIN A1C  Results for orders placed or performed in visit on 06/19/22   AMB POC HEMOGLOBIN A1C   Result Value Ref Range    Hemoglobin A1C, POC 9.3 %         pioglitazone (ACTOS) 30 MG tablet  *new drug added to regimen with glipizide.  Still encouraged pt to get info about formulary from insurance.      2. Bilateral impacted cerumen  REMOVAL IMPACTED CERUMEN IRRIGATION/LVG UNILAT  Pt tolerated well and says she can hear, has relief.  Ear exam post-irrigation reported nl per nurse (I did not get the chance to examine myself).          Pt going on vacation and will have to wait to be seen after.  She agrees to notify if any issues with new Rx, BS, or other.  Encouraged pt to continue working on LM.  Return in about 27 days (around 07/16/2022), or sooner if symptoms worsen or fail to improve.  Seek care in interim for any new sxs or other concerns.  Pt verbalizes understanding and agrees with the plan.    Nicole Cella, PA-C

## 2022-06-19 NOTE — Patient Instructions (Signed)
Patient Education        Type 2 Diabetes: Care Instructions  Your Care Instructions     Type 2 diabetes is a disease that develops when the body's tissues cannot use insulin properly. Over time, the pancreas cannot make enough insulin. Insulin is a hormone that helps the body's cells use sugar (glucose) for energy. It also helps the body store extra sugar in muscle, fat, and liver cells.  Without insulin, the sugar cannot get into the cells to do its work. It stays in the blood instead. This can cause high blood sugar levels. A person has diabetes when the blood sugar stays too high too much of the time. Over time, diabetes can lead to diseases of the heart, blood vessels, nerves, kidneys, and eyes.  You may be able to control your blood sugar by losing weight, eating a healthy diet, and getting daily exercise. You may also have to take insulin or other diabetes medicine.  Follow-up care is a key part of your treatment and safety. Be sure to make and go to all appointments. Call your doctor if you are having problems. It's also a good idea to know your test results and keep a list of the medicines you take.  How can you care for yourself at home?  Keep your blood sugar at a target level (which you set with your doctor).  Carbohydrate--the body's main source of fuel--affects blood sugar more than any other nutrient. Carbohydrate is in fruits, vegetables, milk, and yogurt. It also is in breads, cereals, vegetables such as potatoes and corn, and sugary foods such as candy and cakes. Follow your meal plan to know how much carbohydrate to eat at each meal and snack.  Aim for 30 minutes of exercise on most, preferably all, days of the week. Walking is a good choice. You also may want to do other activities, such as running, swimming, cycling, or playing tennis or team sports. Try to do muscle-strengthening exercises at least 2 times a week.  Take your medicines exactly as prescribed. Call your doctor if you think you are  having a problem with your medicine. You will get more details on the specific medicines your doctor prescribes.  Check your blood sugar as often as your doctor recommends. It is important to keep track of any symptoms you have, such as low blood sugar. Also tell your doctor if you have any changes in your activities, diet, or insulin use.  Talk to your doctor before you start taking aspirin every day. Aspirin can help certain people lower their risk of a heart attack or stroke. But taking aspirin isn't right for everyone, because it can cause serious bleeding.  Do not smoke. If you need help quitting, talk to your doctor about stop-smoking programs and medicines. These can increase your chances of quitting for good.  Keep your cholesterol and blood pressure at normal levels. You may need to take one or more medicines to reach your goals. Take them exactly as directed. Do not stop or change a medicine without talking to your doctor first.  When should you call for help?   Call 911 anytime you think you may need emergency care. For example, call if:    You passed out (lost consciousness), or you suddenly become very sleepy or confused. (You may have very low blood sugar.)   Call your doctor now or seek immediate medical care if:    Your blood sugar is 300 mg/dL or is higher than  the level your doctor has set for you.     You have symptoms of low blood sugar, such as:  Sweating.  Feeling nervous, shaky, and weak.  Extreme hunger and slight nausea.  Dizziness and headache.  Blurred vision.  Confusion.   Watch closely for changes in your health, and be sure to contact your doctor if:    You often have problems controlling your blood sugar.     You have symptoms of long-term diabetes problems, such as:  New vision changes.  New pain, numbness, or tingling in your hands or feet.  Skin problems.   Where can you learn more?  Go to https://www.healthwise.net/patientEd and enter C553 to learn more about "Type 2 Diabetes: Care  Instructions."  Current as of: October 31, 2021               Content Version: 13.8   2006-2023 [employer].   Care instructions adapted under license by [facility]. If you have questions about a medical condition or this instruction, always ask your healthcare professional. [employer] disclaims any warranty or liability for your use of this information.

## 2022-06-19 NOTE — Progress Notes (Signed)
BL ear lavage completed.  Warm water and Hydrogen peroxide mix used to remove cerumen from ears BL.  Right Canal cleared after 6 lavages and left ear cleared after 8 lavages.

## 2022-07-17 ENCOUNTER — Ambulatory Visit: Payer: BLUE CROSS/BLUE SHIELD | Attending: Physician Assistant | Primary: Physician Assistant
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
## 2022-11-15 ENCOUNTER — Encounter: Payer: BLUE CROSS/BLUE SHIELD | Attending: Physician Assistant | Primary: Physician Assistant

## 2022-11-21 ENCOUNTER — Ambulatory Visit
Admit: 2022-11-21 | Discharge: 2022-11-21 | Payer: BLUE CROSS/BLUE SHIELD | Attending: Physician Assistant | Primary: Physician Assistant

## 2022-11-21 DIAGNOSIS — F419 Anxiety disorder, unspecified: Secondary | ICD-10-CM

## 2022-11-21 LAB — AMB POC URINALYSIS DIP STICK AUTO W/O MICRO
Bilirubin, Urine, POC: NEGATIVE
Glucose, Urine, POC: NEGATIVE
Ketones, Urine, POC: NEGATIVE
Nitrite, Urine, POC: NEGATIVE
Protein, Urine, POC: NEGATIVE
Specific Gravity, Urine, POC: 1.005 (ref 1.001–1.035)
Urobilinogen, POC: 0.2
pH, Urine, POC: 6.5 (ref 4.6–8.0)

## 2022-11-21 LAB — AMB POC HEMOGLOBIN A1C: Hemoglobin A1C, POC: 9.2 %

## 2022-11-21 MED ORDER — GLIPIZIDE 5 MG PO TABS
5 MG | ORAL_TABLET | Freq: Two times a day (BID) | ORAL | 1 refills | Status: DC
Start: 2022-11-21 — End: 2023-06-19

## 2022-11-21 MED ORDER — PIOGLITAZONE HCL 45 MG PO TABS
45 MG | ORAL_TABLET | Freq: Every day | ORAL | 0 refills | Status: DC
Start: 2022-11-21 — End: 2022-12-14

## 2022-11-21 MED ORDER — FLUOXETINE HCL 20 MG PO CAPS
20 MG | ORAL_CAPSULE | Freq: Two times a day (BID) | ORAL | 1 refills | Status: DC
Start: 2022-11-21 — End: 2023-06-19

## 2022-11-21 MED ORDER — CEPHALEXIN 500 MG PO CAPS
500 | ORAL_CAPSULE | Freq: Two times a day (BID) | ORAL | 0 refills | Status: AC
Start: 2022-11-21 — End: 2022-11-28

## 2022-11-21 NOTE — Progress Notes (Signed)
"  Have you been to the ER, urgent care clinic since your last visit?  Hospitalized since your last visit?"    NO    "Have you seen or consulted any other health care providers outside of [facility] since your last visit?"    NO        "Have you had a colorectal cancer screening such as a colonoscopy/FIT/Cologuard?    NO    No colonoscopy on file  No cologuard on file  No FIT/FOBT on file   No flexible sigmoidoscopy on file         Click Here for Release of Records Request

## 2022-11-21 NOTE — Progress Notes (Signed)
Successful venipuncture in patient's right ac X 1 sticks.  The patient tolerated this procedure w/o c/o pain or discomfort.

## 2022-11-21 NOTE — Progress Notes (Signed)
Madison Peters is a 72 y.o. female who presents to the office today with the following:  Chief Complaint   Patient presents with    Medication Refill     And follow up  Patient has not had her nerve medication in a month and is having a really hard time right now.  Having abdominal pain       Medication Refill  Associated symptoms include nausea. Pertinent negatives include no vomiting.     Pt ran out of several medications about a month ago after not returning for routine f/up and has not felt well since.  Has been very anxious and tearful since running out of her fluoxetine. Denies SI/HI or other psych concerns, but is feeling very sad and "I'll just be doing something and start crying for no reason!" Per pt.  She says also since running out her stomach has been bothering her and cannot eat as much.   Describes as cramping, diffuse, but intermittent.  Will go days w/o symptoms, then days with intermittent cramping.  May occur before or after a meal. Sometimes eating makes it feel better, but not always stating "I don't know what to eat".  Only had diarrhea and fever twice and that was a few days last week and again for a few days about 2 weeks before that.  Says usually the cramping is not with fever/diarrhea and has not had other symptoms associated with it since last week.  Gets nauseated sometimes, but not really associated with the cramping. Has not been vomiting.  Has had normal BM's this week no fever, no blood in stool. Denies any persistent abdominal pain or localized symptoms.  She has had some burning with urination but says she gets that sometimes due to "something else I use a cream for" so she is not sure if UTI is possible. She has noticed some frequency, but no other GU symptoms she can think of. She has noticed a faint amt of bright pink/red in urine when wiping recently after urinating, but nothing seen in toilet. No discharge/ulcers/rash or vaginal irritation.     T2DM  I have tried to send pt  empagliflozin (Jardiance) and then dapagliflozin [name]), but she has not been able to get these due to cost. I have asked her several times to check with her insurance to see if any similar drugs may be preferred on her formulary, as she keeps telling me she "has really good insurance and they should be covered" She has repeatedly refused to do any injectable medications and has been unable to tolerate Metformin due to nausea.   Is taking Glipizide 5 mg BID and pioglitazone 30 mg (added last visit in Oct 2023).  Due to pt's inability to tolerate and/or afford diabetes medications, in addition to her h/o non-compliance with f/up, it has been difficult to get her diabetes under control.   She apologizes that she has not been able to return when recommended.  She has not been checking her sugars recently as "I have been a mess" since out of her fluoxetine.  Hemoglobin A1C   Date Value Ref Range Status   03/13/2022 12.6 (H) 4.0 - 5.6 % Final     Comment:     NEW METHOD  PLEASE NOTE NEW REFERENCE RANGE  (NOTE)  HbA1C Interpretive Ranges  <5.7              Normal  5.7 - 6.4         Consider  Prediabetes  >6.5              Consider Diabetes       H/o hypothyroidism  Due for recheck  Lab Results   Component Value Date    TSH 2.87 06/05/2021    TSH3GEN 1.95 05/29/2022   Okay on refills for now.    HTN  BP has been controlled on current regimen, but no recent checks at home.  Pt has no cardiac complaints today.  Does also have emphysema, continues to smoke, has cut back but has not been willing to quit.  No acute Pulmonary concerns.    Review of Systems   Respiratory: Negative.     Cardiovascular: Negative.    Gastrointestinal:  Positive for diarrhea and nausea. Negative for blood in stool, constipation and vomiting.   Psychiatric/Behavioral:  Positive for dysphoric mood. Negative for agitation, behavioral problems, confusion, decreased concentration, hallucinations, self-injury, sleep disturbance and suicidal ideas. The  patient is nervous/anxious. The patient is not hyperactive.        See HPI.    Past Medical History:   Diagnosis Date    Anxiety     Diabetes ([place])     Dyspepsia     Emphysema     Headache(784.0)     Hypertension     IGT (impaired glucose tolerance)     Kidney stones     Lipoma     Left calf    Thyroid disease     hypothryoidism       Past Surgical History:   Procedure Laterality Date    HEENT      Tonsillectomy at age 75       Allergies   Allergen Reactions    Ace Inhibitors Cough    Erythromycin Other (See Comments)     Does not remember.    Metformin And Related Nausea Only    Amoxicillin Nausea And Vomiting    Sulfa Antibiotics Nausea And Vomiting       Current Outpatient Medications   Medication Sig Dispense Refill    FLUoxetine (PROZAC) 20 MG capsule Take 1 capsule by mouth 2 times daily 180 capsule 1    glipiZIDE (GLUCOTROL) 5 MG tablet Take 1 tablet by mouth 2 times daily 180 tablet 1    pioglitazone (ACTOS) 45 MG tablet Take 1 tablet by mouth daily 30 tablet 0    cephALEXin (KEFLEX) 500 MG capsule Take 1 capsule by mouth 2 times daily for 7 days 14 capsule 0    atorvastatin (LIPITOR) 80 MG tablet Take 1 tablet by mouth daily 90 tablet 1    levothyroxine (SYNTHROID) 50 MCG tablet Take 1 tablet by mouth daily 90 tablet 1    losartan (COZAAR) 25 MG tablet Take 1 tablet by mouth daily 90 tablet 1    metoprolol succinate (TOPROL XL) 50 MG extended release tablet Take 1 tablet by mouth daily 90 tablet 1    glucose monitoring (FREESTYLE FREEDOM) kit 1 kit by Does not apply route daily 1 kit 0    blood glucose monitor strips Test 3 times a day & as needed for symptoms of irregular blood glucose. Dispense sufficient amount for indicated testing frequency plus additional to accommodate PRN testing needs. 100 strip 5    Lancets MISC 1 each by Does not apply route daily 100 each 5    acetaminophen (TYLENOL) 500 MG tablet Take by mouth every 6 hours as needed       No current facility-administered medications  for this  visit.       Social History     Socioeconomic History    Marital status: Married     Spouse name: None    Number of children: None    Years of education: None    Highest education level: None   Tobacco Use    Smoking status: Light Smoker     Current packs/day: 0.00     Average packs/day: 1 pack/day for 55.0 years (55.0 ttl pk-yrs)     Types: Cigarettes     Start date: 01/29/1963     Last attempt to quit: 01/28/2018     Years since quitting: 4.8     Passive exposure: Current    Smokeless tobacco: Never   Vaping Use    Vaping Use: Never used   Substance and Sexual Activity    Alcohol use: No    Drug use: No    Sexual activity: Not Currently     Partners: Male     Social Determinants of Health     Financial Resource Strain: Low Risk  (05/08/2022)    Overall Financial Resource Strain (CARDIA)     Difficulty of Paying Living Expenses: Not hard at all   Transportation Needs: Unknown (05/08/2022)    PRAPARE - Transportation     Lack of Transportation (Non-Medical): No   Housing Stability: Unknown (05/08/2022)    Housing Stability Vital Sign     Unstable Housing in the Last Year: No       Family History   Problem Relation Age of Onset    Heart Disease Mother     Other Father         AAA         Physical Exam:  Vitals:    11/21/22 1043   BP: 134/67   Site: Left Upper Arm   Position: Sitting   Cuff Size: Medium Adult   Pulse: 94   Resp: 16   Temp: 97.8 F (36.6 C)   SpO2: 94%   Weight: 75.8 kg (167 lb)   Height: 1.702 m (5\' 7" )     Physical Exam  Vitals and nursing note reviewed.   Constitutional:       Appearance: Normal appearance.   HENT:      Head: Normocephalic and atraumatic.      Right Ear: External ear normal.      Left Ear: External ear normal.   Eyes:      Conjunctiva/sclera: Conjunctivae normal.   Neck:      Thyroid: No thyroid mass, thyromegaly or thyroid tenderness.   Cardiovascular:      Rate and Rhythm: Normal rate and regular rhythm.      Pulses: Normal pulses.      Heart sounds: Normal heart sounds.   Pulmonary:       Effort: Pulmonary effort is normal.      Breath sounds: Normal breath sounds.   Abdominal:      General: Bowel sounds are normal. There is no distension.      Palpations: Abdomen is soft. There is no mass.      Tenderness: There is no abdominal tenderness. There is no right CVA tenderness, left CVA tenderness, guarding or rebound.      Hernia: No hernia is present.   Musculoskeletal:         General: No swelling.      Cervical back: Neck supple.   Skin:     General: Skin is warm and dry.  Neurological:      General: No focal deficit present.      Mental Status: She is alert and oriented to person, place, and time. Mental status is at baseline.   Psychiatric:         Attention and Perception: Attention normal.         Mood and Affect: Mood is anxious and depressed. Affect is tearful.         Speech: Speech normal.         Behavior: Behavior normal.         Thought Content: Thought content normal.         Cognition and Memory: Cognition normal.         Judgment: Judgment normal.         Assessment/Plan:   Diagnosis Orders   1. 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 Primary hypertension  Basic Metabolic Panel    CBC with Auto Differential    CBC with Auto Differential    Basic Metabolic Panel      4. Thyroid disease  TSH    Has missed a few doses of thyroid medication as well, so may hold off on changes if slightly off in results.       5. Acute cystitis with hematuria  cephALEXin (KEFLEX) 500 MG capsule  Instructed to push fluids (ie water/cranberry juice). Avoid caffeine and carbonated bvgs.  Counseled on otc medications for sxs relief.   Educational handout provided with home care instructions.  Instructed to RTO/seek medical attn if sxs  persist/worsen.        6. Dysuria  AMB POC URINALYSIS DIP STICK AUTO W/O MICRO    Culture, Urine    Culture, Urine   7. Hyperlipidemia                                                           -- She will return for lipids another day as she also has not been taking statin consistently.     Results for orders placed or performed in visit on 11/21/22   AMB POC HEMOGLOBIN A1C   Result Value Ref Range    Hemoglobin A1C, POC 9.2 %   AMB POC URINALYSIS DIP STICK AUTO W/O MICRO   Result Value Ref Range    Color, Urine, POC Light Yellow     Clarity, Urine, POC Slightly Cloudy     Glucose, Urine, POC Negative     Bilirubin, Urine, POC Negative     Ketones, Urine, POC Negative     Specific Gravity, Urine, POC 1.005 1.001 - 1.035    Blood, Urine, POC 2+     pH, Urine, POC 6.5 4.6 - 8.0    Protein, Urine, POC Negative     Urobilinogen, POC 0.2 mg/dL     Nitrite, Urine, POC Negative     Leukocyte Esterase, Urine, POC 1+      Suspect pt mood/tearfulness  and possibly even intermittent stomach cramping due to withdrawal effects from missing her fluoxetine for the last 4 weeks. Also has UTI, but unremarkable abd exam and possibly recent GI viral illness to add to the picture.   With nl exam, recommend pt treat UTI and resume medication and f/up as planned.   To rtc sooner if worsening abdominal/GI symptoms. Discussed diet.    Recommend pt resume all medications as instructed.  I have asked she take all medications as directed and bring BS log to f/up. I have increased her Actos from 30-45 mg.  Once pt able to take what she is already suppose to be taking consistently, will need to figure out other options to bring her BS down as pt has had issues with other meds due to AE/cost or just preference over po tx.    Pt will be notified of results and any further instructions.  Recheck 4 wks after resuming fluoxetine, sooner prn.    Pt agrees to rtc/seek care in interim for any new sxs or other concerns.  Pt verbalizes understanding and  agrees with the plan.    Total time spent with the patient today was 45 minutes of which more than 50% was spent face to face with the patient.          Nicole Cella, PA-C

## 2022-11-22 LAB — CBC WITH AUTO DIFFERENTIAL
Absolute Immature Granulocyte: 0 10*3/uL (ref 0.00–0.04)
Basophils %: 1 % (ref 0–1)
Basophils Absolute: 0.1 10*3/uL (ref 0.0–0.1)
Eosinophils %: 2 % (ref 0–7)
Eosinophils Absolute: 0.1 10*3/uL (ref 0.0–0.4)
Hematocrit: 39.6 % (ref 35.0–47.0)
Hemoglobin: 13.5 g/dL (ref 11.5–16.0)
Immature Granulocytes: 1 % — ABNORMAL HIGH (ref 0.0–0.5)
Lymphocytes %: 14 % (ref 12–49)
Lymphocytes Absolute: 1.1 10*3/uL (ref 0.8–3.5)
MCH: 30.1 PG (ref 26.0–34.0)
MCHC: 34.1 g/dL (ref 30.0–36.5)
MCV: 88.4 FL (ref 80.0–99.0)
MPV: 9.7 FL (ref 8.9–12.9)
Monocytes %: 10 % (ref 5–13)
Monocytes Absolute: 0.8 10*3/uL (ref 0.0–1.0)
Neutrophils %: 72 % (ref 32–75)
Neutrophils Absolute: 5.7 10*3/uL (ref 1.8–8.0)
Nucleated RBCs: 0 PER 100 WBC
Platelets: 274 10*3/uL (ref 150–400)
RBC: 4.48 M/uL (ref 3.80–5.20)
RDW: 12.8 % (ref 11.5–14.5)
WBC: 7.8 10*3/uL (ref 3.6–11.0)
nRBC: 0 10*3/uL (ref 0.00–0.01)

## 2022-11-22 LAB — TSH: TSH, 3RD GENERATION: 3.35 u[IU]/mL (ref 0.36–3.74)

## 2022-11-22 LAB — BASIC METABOLIC PANEL
Anion Gap: 9 mmol/L (ref 5–15)
BUN: 14 MG/DL (ref 6–20)
Bun/Cre Ratio: 18 (ref 12–20)
CO2: 29 mmol/L (ref 21–32)
Calcium: 8.7 MG/DL (ref 8.5–10.1)
Chloride: 99 mmol/L (ref 97–108)
Creatinine: 0.79 MG/DL (ref 0.55–1.02)
Est, Glom Filt Rate: >60 mL/min/{1.73_m2} (ref >60)
Glucose: 239 mg/dL — ABNORMAL HIGH (ref 65–100)
Potassium: 3.6 mmol/L (ref 3.5–5.1)
Sodium: 137 mmol/L (ref 136–145)

## 2022-11-24 LAB — CULTURE, URINE: Colony count: >100000

## 2022-12-14 ENCOUNTER — Ambulatory Visit
Admit: 2022-12-14 | Discharge: 2022-12-14 | Payer: BLUE CROSS/BLUE SHIELD | Attending: Physician Assistant | Primary: Physician Assistant

## 2022-12-14 DIAGNOSIS — E1121 Type 2 diabetes mellitus with diabetic nephropathy: Secondary | ICD-10-CM

## 2022-12-14 MED ORDER — PIOGLITAZONE HCL 45 MG PO TABS
45 MG | ORAL_TABLET | Freq: Every day | ORAL | 1 refills | Status: AC
Start: 2022-12-14 — End: 2023-06-19

## 2022-12-14 NOTE — Progress Notes (Signed)
"  Have you been to the ER, urgent care clinic since your last visit?  Hospitalized since your last visit?"    NO    "Have you seen or consulted any other health care providers outside of [facility] since your last visit?"    NO        "Have you had a colorectal cancer screening such as a colonoscopy/FIT/Cologuard?    NO    No colonoscopy on file  No cologuard on file  No FIT/FOBT on file   No flexible sigmoidoscopy on file         Click Here for Release of Records Request

## 2022-12-14 NOTE — Progress Notes (Signed)
Madison Peters is a 72 y.o. female who presents to the office today with the following:  Chief Complaint   Patient presents with    Follow-up     1 week follow up anxiety and depression       HPI  Pt presents for f/up after having multiple complaints, likely due to running out of several of her medications and non-compliance with f/up.    She was very tearful after running out of her fluoxetine and was having stomach cramps with intermittent diarrhea.  She also had some burning with urination.  Today she reports feeling much better with all symptoms resolving after resuming her usual medication.  She is no longer tearful and is not feeling depressed or over anxious, no SI/HI or other psych concerns.    Pt reports UTI resolved, though she only completed 2 d worth of medication due to SE (stomach pains).  She says she does not really want to have her urine rechecked as she felt fine after and is not having any GU or other symptoms.  Will return if symptoms return.    T2DM  A1c in March was 9.2  Pt has h/o non-compliance.  Hemoglobin A1C   Date Value Ref Range Status   03/13/2022 12.6 (H) 4.0 - 5.6 % Final     Comment:     NEW METHOD  PLEASE NOTE NEW REFERENCE RANGE  (NOTE)  HbA1C Interpretive Ranges  <5.7              Normal  5.7 - 6.4         Consider Prediabetes  >6.5              Consider Diabetes     We did increase her Actos from 30 to 45 mg, however, pt did not bring in her BS log as instructed.  Still has not figured out others on formulary, but still is adamant about avoiding injectable medications.  I have asked her on multiple occasions to reach out to insurance to see if they have other preferred meds they will cover as each time I have tried sending something pt does not pick it up due to cost and then does not return for f/up on time and sugars continue to be poorly controlled (though much improved from last summer).     Pt otherwise reports feeling well with no other complaints or acute  concerns.    Review of Systems    See HPI.    Past Medical History:   Diagnosis Date    Anxiety     Diabetes (HCC)     Dyspepsia     Emphysema     Headache(784.0)     Hypertension     IGT (impaired glucose tolerance)     Kidney stones     Lipoma     Left calf    Thyroid disease     hypothryoidism       Past Surgical History:   Procedure Laterality Date    HEENT      Tonsillectomy at age 89       Allergies   Allergen Reactions    Ace Inhibitors Cough    Erythromycin Other (See Comments)     Does not remember.    Metformin And Related Nausea Only    Amoxicillin Nausea And Vomiting    Sulfa Antibiotics Nausea And Vomiting       Current Outpatient Medications   Medication Sig Dispense Refill  pioglitazone (ACTOS) 45 MG tablet Take 1 tablet by mouth daily 90 tablet 1    FLUoxetine (PROZAC) 20 MG capsule Take 1 capsule by mouth 2 times daily 180 capsule 1    glipiZIDE (GLUCOTROL) 5 MG tablet Take 1 tablet by mouth 2 times daily 180 tablet 1    atorvastatin (LIPITOR) 80 MG tablet Take 1 tablet by mouth daily 90 tablet 1    levothyroxine (SYNTHROID) 50 MCG tablet Take 1 tablet by mouth daily 90 tablet 1    losartan (COZAAR) 25 MG tablet Take 1 tablet by mouth daily 90 tablet 1    metoprolol succinate (TOPROL XL) 50 MG extended release tablet Take 1 tablet by mouth daily 90 tablet 1    glucose monitoring (FREESTYLE FREEDOM) kit 1 kit by Does not apply route daily 1 kit 0    blood glucose monitor strips Test 3 times a day & as needed for symptoms of irregular blood glucose. Dispense sufficient amount for indicated testing frequency plus additional to accommodate PRN testing needs. 100 strip 5    Lancets MISC 1 each by Does not apply route daily 100 each 5    acetaminophen (TYLENOL) 500 MG tablet Take by mouth every 6 hours as needed       No current facility-administered medications for this visit.       Social History     Socioeconomic History    Marital status: Married   Tobacco Use    Smoking status: Light Smoker      Current packs/day: 0.00     Average packs/day: 1 pack/day for 55.0 years (55.0 ttl pk-yrs)     Types: Cigarettes     Start date: 01/29/1963     Last attempt to quit: 01/28/2018     Years since quitting: 4.8     Passive exposure: Current    Smokeless tobacco: Never   Vaping Use    Vaping Use: Never used   Substance and Sexual Activity    Alcohol use: No    Drug use: No    Sexual activity: Not Currently     Partners: Male     Social Determinants of Health     Financial Resource Strain: Low Risk  (05/08/2022)    Overall Financial Resource Strain (CARDIA)     Difficulty of Paying Living Expenses: Not hard at all   Transportation Needs: Unknown (05/08/2022)    PRAPARE - Transportation     Lack of Transportation (Non-Medical): No   Housing Stability: Unknown (05/08/2022)    Housing Stability Vital Sign     Unstable Housing in the Last Year: No       Family History   Problem Relation Age of Onset    Heart Disease Mother     Other Father         AAA         Physical Exam:  Vitals:    12/14/22 0933 12/14/22 1003   BP: 134/68    Site: Left Upper Arm    Position: Sitting    Cuff Size: Medium Adult    Pulse: (!) 109 100   Resp: 16    Temp: (!) 96.3 F (35.7 C)    TempSrc: Temporal    SpO2: 93%    Weight: 71.8 kg (158 lb 6.4 oz)    Height: 1.702 m ( )    Pt drinks 4 cups coffee daily.  Rec she cut back caffeine and increase water intake.  Admits a brief flutter only  on one occasion w/o other symptoms.  Will work on those rec and rtc if continues to occur.     Physical Exam  Vitals and nursing note reviewed.   Constitutional:       Appearance: Normal appearance.   HENT:      Head: Normocephalic and atraumatic.   Cardiovascular:      Rate and Rhythm: Normal rate and regular rhythm.      Pulses: Normal pulses.      Heart sounds: Normal heart sounds.   Pulmonary:      Effort: Pulmonary effort is normal. No respiratory distress.      Breath sounds: No stridor. Wheezing (faint left LL at end of resp) present. No rhonchi.      Comments:  Overall slightly decreased lung sounds. Pt known smoker/copd, no pulm complaints.  Abdominal:      Palpations: Abdomen is soft.      Tenderness: There is no abdominal tenderness.   Musculoskeletal:         General: No swelling.      Cervical back: Neck supple.   Skin:     General: Skin is warm and dry.   Neurological:      General: No focal deficit present.      Mental Status: She is alert and oriented to person, place, and time. Mental status is at baseline.   Psychiatric:         Mood and Affect: Mood normal.         Behavior: Behavior normal.         Assessment/Plan:   Diagnosis Orders   1. Type 2 diabetes with nephropathy (HCC)  pioglitazone (ACTOS) 45 MG tablet  I do not know how much a difference in her sugars this recent increase in actos has made as pt still is not checking her sugars.  I had a long discussion with her today to encourage her to do so. She may reach out to me in the next week or so with readings or plan a f/up with her new provider in the next few weeks and bring this in with her. We may need to try to send another medication in, but reiterated to pt it would be helpful to know what is on her insurance formulary.  Cont to work on [employer].      2. Anxiety and depression  Pt doing much better on her medication now that she is taking as prescribed.  Continue fluoxetine as prescribed.        Routine f/up in Sept 2024, but needs to return for diabetes recheck in the next few weeks, A1c again in June.  Seek care in interim for any new sxs or other concerns.  Pt verbalizes understanding and agrees with the plan.    Dorothyann Gibbs, PA-C

## 2023-02-07 IMAGING — MR MRI THORACIC SPINE WITHOUT CONTRAST
8 of 13 series · 21 of 48 positions shown · non-contrast
Comparison: none

Chronic back pain.
FINAL REPORT:
HISTORY: Thoracic radiculopathy. Mid back pain.
MRI thoracic spine without contrast.
TECHNIQUE: Multiplanar multisequence MR imaging obtained of the thoracic spine without contrast.

[survey · coronal · 1.7mm · 1.67mm/px · 12 of 128 slices shown]
[im 1/128]
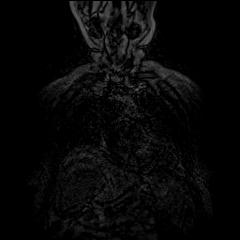
[im 12/128]
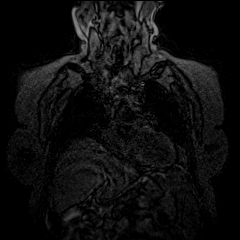
[im 24/128]
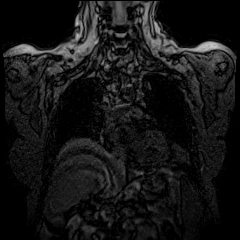
[im 35/128]
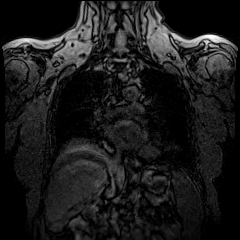
[im 47/128]
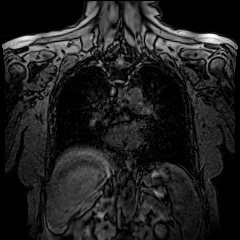
[im 58/128]
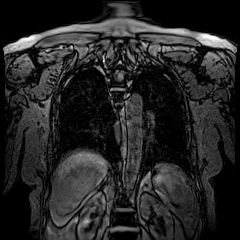
[im 70/128]
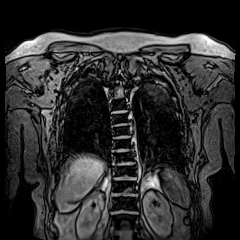
[im 81/128]
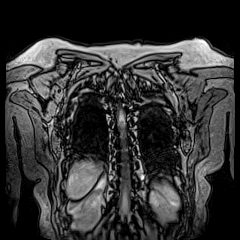
[im 93/128]
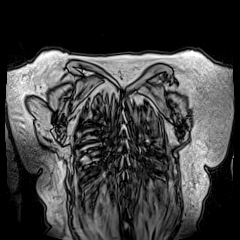
[im 104/128]
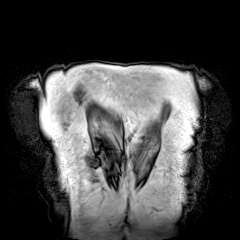
[im 116/128]
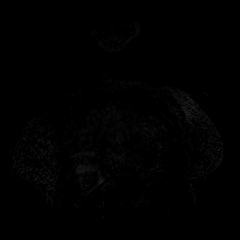
[im 128/128]
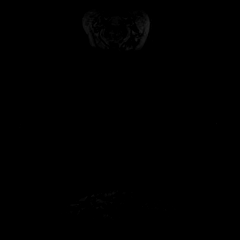

[survey_mpr_sag · sagittal · 1.7mm · 1.67mm/px · 1 of 15 slices shown]
[im 1/15]
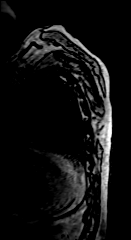

[survey_mpr_(person_name) · axial · 1.7mm · 1.67mm/px · 1 of 7 slices shown]
[im 1/7]
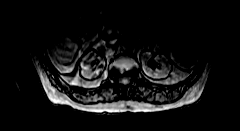

[T2 · sagittal · 4.0mm · 0.76mm/px · 1 of 15 slices shown]
[im 1/15]
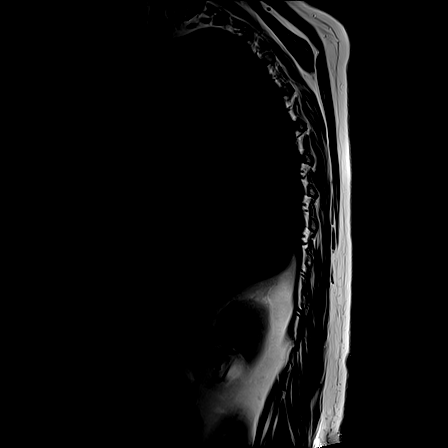

[T1 · sagittal · 4.0mm · 1.06mm/px · 1 of 15 slices shown]
[im 1/15]
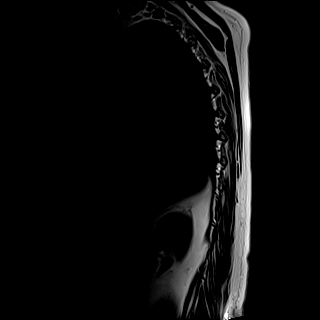

[STIR · sagittal · 4.0mm · 0.89mm/px · 1 of 15 slices shown]
[im 1/15]
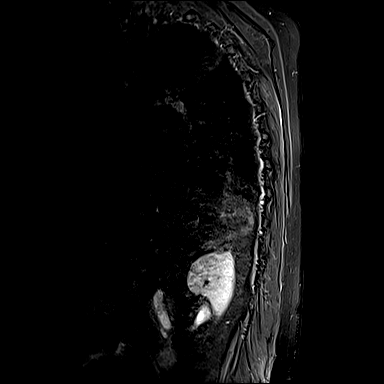

[GRE · axial · 7.0mm · 0.78mm/px · z∈[-159,+24]mm · 2 of 24 slices shown (1 of 2)]
[im 1/24]
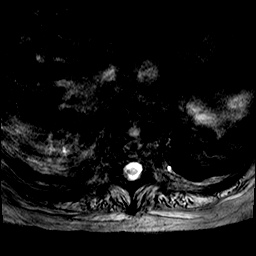
[im 24/24]
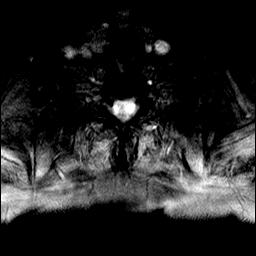

[GRE · axial · 7.0mm · 0.78mm/px · z∈[-270,-86]mm · 2 of 24 slices shown (2 of 2)]
[im 1/24]
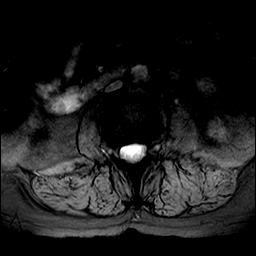
[im 24/24]
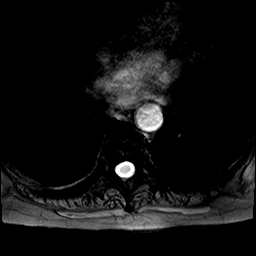

[21 of 48 positions shown; findings below may reference images not displayed]

FINDINGS: Sagittal imaging demonstrates mild to moderate degenerative endplate changes and mild disc height loss at most levels in the mid to lower thoracic spine. No acute compression deformity is demonstrated. Several small endplate Schmorl's nodes are noted. Alignment is otherwise maintained. The thoracic cord is normal in contour and signal intensity.
Axial MR imaging demonstrates shallow anterior effacement of thecal sac from small segment disc bulges and minimal central disc protrusions at several levels in the thoracic spine. Central spinal stenosis does not exceed a mild degree at any given level. No significant neuroforaminal stenosis.
IMPRESSION: Scattered mild to moderate degenerative endplate and disc disease in the thoracic spine with multilevel central spinal stenosis not exceeding a mild degree.

## 2023-06-12 ENCOUNTER — Encounter

## 2023-06-12 NOTE — Telephone Encounter (Signed)
Refill  Fluoxetine  Atorvastatin  Metoprolol  Levothyroxine     90 supply please

## 2023-06-12 NOTE — Telephone Encounter (Signed)
Requested Prescriptions     Pending Prescriptions Disp Refills    atorvastatin (LIPITOR) 80 MG tablet 90 tablet 1     Sig: Take 1 tablet by mouth daily    FLUoxetine (PROZAC) 20 MG capsule 180 capsule 1     Sig: Take 1 capsule by mouth 2 times daily    levothyroxine (SYNTHROID) 50 MCG tablet 90 tablet 1     Sig: Take 1 tablet by mouth daily    metoprolol succinate (TOPROL XL) 50 MG extended release tablet 90 tablet 1     Sig: Take 1 tablet by mouth daily     Last seen:  12/14/22  Routine f/up in Sept 2024, but needs to return for diabetes recheck in the next few weeks, A1c again in June.  Seek care in interim for any new sxs or other concerns.  Pt verbalizes understanding and agrees with the plan.  Dorothyann Gibbs, PA-C     Future apt:  None    Last filled:    atorvastatin (LIPITOR) 80 MG tablet [[id number]]    Order Details  Dose: 80 mg Route: Oral Frequency: DAILY   Dispense Quantity: 90 tablet Refills: 1          Sig: Take 1 tablet by mouth daily         Start Date: 03/13/22       FLUoxetine (PROZAC) 20 MG capsule [[id number]]    Order Details  Dose: 20 mg Route: Oral Frequency: 2 TIMES DAILY   Dispense Quantity: 180 capsule Refills: 1          Sig: Take 1 capsule by mouth 2 times daily         Start Date: 11/21/22       levothyroxine (SYNTHROID) 50 MCG tablet [[id number]]    Order Details  Dose: 50 mcg Route: Oral Frequency: DAILY   Dispense Quantity: 90 tablet Refills: 1          Sig: Take 1 tablet by mouth daily         Start Date: 03/13/22     metoprolol succinate (TOPROL XL) 50 MG extended release tablet [[id number]]    Order Details  Dose: 50 mg Route: Oral Frequency: DAILY   Dispense Quantity: 90 tablet Refills: 1          Sig: Take 1 tablet by mouth daily         Start Date: 03/13/22

## 2023-06-13 NOTE — Telephone Encounter (Signed)
LMTCB

## 2023-06-19 ENCOUNTER — Ambulatory Visit
Admit: 2023-06-19 | Discharge: 2023-06-19 | Payer: BLUE CROSS/BLUE SHIELD | Attending: Geriatric Medicine | Primary: Geriatric Medicine

## 2023-06-19 DIAGNOSIS — E1121 Type 2 diabetes mellitus with diabetic nephropathy: Secondary | ICD-10-CM

## 2023-06-19 LAB — AMB POC HEMOGLOBIN A1C: Hemoglobin A1C, POC: 7.6 %

## 2023-06-19 MED ORDER — FLUOXETINE HCL 20 MG PO CAPS
20 MG | ORAL_CAPSULE | Freq: Every day | ORAL | 1 refills | Status: DC
Start: 2023-06-19 — End: 2023-12-06

## 2023-06-19 MED ORDER — FLUOXETINE HCL 20 MG PO CAPS
20 | ORAL_CAPSULE | Freq: Two times a day (BID) | ORAL | 1 refills | Status: DC
Start: 2023-06-19 — End: 2023-06-19

## 2023-06-19 MED ORDER — LOSARTAN POTASSIUM 25 MG PO TABS
25 MG | ORAL_TABLET | Freq: Every day | ORAL | 1 refills | Status: DC
Start: 2023-06-19 — End: 2023-12-06

## 2023-06-19 MED ORDER — PIOGLITAZONE HCL 45 MG PO TABS
45 MG | ORAL_TABLET | Freq: Every day | ORAL | 1 refills | Status: DC
Start: 2023-06-19 — End: 2023-12-06

## 2023-06-19 MED ORDER — GLIPIZIDE 5 MG PO TABS
5 MG | ORAL_TABLET | Freq: Two times a day (BID) | ORAL | 1 refills | Status: DC
Start: 2023-06-19 — End: 2023-12-06

## 2023-06-19 MED ORDER — METOPROLOL SUCCINATE ER 50 MG PO TB24
50 MG | ORAL_TABLET | Freq: Every day | ORAL | 1 refills | Status: DC
Start: 2023-06-19 — End: 2023-12-06

## 2023-06-19 MED ORDER — ATORVASTATIN CALCIUM 80 MG PO TABS
80 MG | ORAL_TABLET | Freq: Every day | ORAL | 1 refills | Status: DC
Start: 2023-06-19 — End: 2023-12-06

## 2023-06-19 MED ORDER — LEVOTHYROXINE SODIUM 50 MCG PO TABS
50 MCG | ORAL_TABLET | Freq: Every day | ORAL | 1 refills | Status: DC
Start: 2023-06-19 — End: 2023-12-06

## 2023-06-19 MED ORDER — EMPAGLIFLOZIN 10 MG PO TABS
10 MG | ORAL_TABLET | Freq: Every day | ORAL | 1 refills | Status: DC
Start: 2023-06-19 — End: 2023-12-06

## 2023-06-19 NOTE — Progress Notes (Signed)
"Have you been to the ER, urgent care clinic since your last visit?  Hospitalized since your last visit?"    NO    "Have you seen or consulted any other health care providers outside our system since your last visit?"    NO      "Have you had a colorectal cancer screening such as a colonoscopy/FIT/Cologuard?    NO    No colonoscopy on file  No cologuard on file  No FIT/FOBT on file   No flexible sigmoidoscopy on file     "Have you had a diabetic eye exam?"    YES - Where: Does not remember where she goes but says it was within the last year. Nurse/CMA to request most recent records if not in the chart     Date of last diabetic eye exam: 07/03/2018     06/19/2023      Chief Complaint   Patient presents with    Diabetes     Med refills and follow up         History of Present Illness:        Madison Peters is a 72 y.o. female to follow up diabetes, HTN and lipids. Feels well except for chronic cough.       Allergies   Allergen Reactions    Ace Inhibitors Cough    Erythromycin Other (See Comments)     Does not remember.    Metformin And Related Nausea Only    Amoxicillin Nausea And Vomiting    Sulfa Antibiotics Nausea And Vomiting       Current Outpatient Medications   Medication Sig Dispense Refill    losartan (COZAAR) 25 MG tablet Take 1 tablet by mouth daily 90 tablet 1    pioglitazone (ACTOS) 45 MG tablet Take 1 tablet by mouth daily 90 tablet 1    glipiZIDE (GLUCOTROL) 5 MG tablet Take 1 tablet by mouth 2 times daily 180 tablet 1    atorvastatin (LIPITOR) 80 MG tablet Take 1 tablet by mouth daily 90 tablet 1    empagliflozin (JARDIANCE) 10 MG tablet Take 1 tablet by mouth daily 90 tablet 1    levothyroxine (SYNTHROID) 50 MCG tablet Take 1 tablet by mouth daily 90 tablet 1    metoprolol succinate (TOPROL XL) 50 MG extended release tablet Take 1 tablet by mouth daily 90 tablet 1    FLUoxetine (PROZAC) 20 MG capsule Take 2 capsules by mouth daily 180 capsule 1    glucose monitoring (FREESTYLE FREEDOM) kit 1 kit by Does not  apply route daily 1 kit 0    blood glucose monitor strips Test 3 times a day & as needed for symptoms of irregular blood glucose. Dispense sufficient amount for indicated testing frequency plus additional to accommodate PRN testing needs. 100 strip 5    Lancets MISC 1 each by Does not apply route daily 100 each 5    acetaminophen (TYLENOL) 500 MG tablet Take by mouth every 6 hours as needed       No current facility-administered medications for this visit.             Physical Examination:    BP 129/76 (Site: Left Upper Arm, Position: Sitting, Cuff Size: Medium Adult)   Pulse (!) 109   Temp 98.1 F (36.7 C) (Oral)   Resp 16   Ht 1.702 m (5\' 7" )   Wt 66.1 kg (145 lb 12.8 oz)   SpO2 93%   BMI 22.84 kg/m  General:  Alert, cooperative, no distress.   HEENT:  Normocephalic, without obvious abnormality, atraumatic.Conjunctivae/corneas clear. Pupils equal, round, reactive to light. Extraocular movements intact.TMs and external canals normal bilaterally. Nasal mucosa and oropharynx clear.   Lungs: Clear to auscultation bilaterally.   Chest wall:  No tenderness or deformity.   Heart:  Regular rate and rhythm, S1, S2 normal, no murmur, click, rub, or gallop.   Abdomen:   Soft, non-tender. Bowel sounds normal. No masses. No organomegaly.   Extremities: Extremities normal, atraumatic, no cyanosis or edema.   Pulses: 2+ and symmetric all extremities.   Skin: Skin color, texture, turgor normal. No rashes or lesions.   Lymph nodes: Cervical, supraclavicular, and axillary nodes normal.   Neurologic: CNII-XII intact. Normal strength, sensation, and reflexes throughout.   Diabetic Foot Exam:  Both feet are examined and demonstrate intact DP pulses.  There is good capillary refill and sensation is intact except at heel pad bilaterally.  Skin is intact and there are no ulcers or sores.  Results for orders placed or performed in visit on 06/19/23   AMB POC HEMOGLOBIN A1C   Result Value Ref Range    Hemoglobin A1C, POC 7.6 %              ASSESSMENT AND PLAN    1. Type 2 diabetes with nephropathy (HCC)  Add SGLT2 agent. Recheck in 3 months  - AMB POC HEMOGLOBIN A1C  - Microalbumin / Creatinine Urine Ratio; Future  - HM DIABETES FOOT EXAM  - losartan (COZAAR) 25 MG tablet; Take 1 tablet by mouth daily  Dispense: 90 tablet; Refill: 1  - pioglitazone (ACTOS) 45 MG tablet; Take 1 tablet by mouth daily  Dispense: 90 tablet; Refill: 1  - glipiZIDE (GLUCOTROL) 5 MG tablet; Take 1 tablet by mouth 2 times daily  Dispense: 180 tablet; Refill: 1  - empagliflozin (JARDIANCE) 10 MG tablet; Take 1 tablet by mouth daily  Dispense: 90 tablet; Refill: 1    2. Hyperlipidemia, unspecified hyperlipidemia type    - Lipid Panel; Future  - Comprehensive Metabolic Panel; Future  - atorvastatin (LIPITOR) 80 MG tablet; Take 1 tablet by mouth daily  Dispense: 90 tablet; Refill: 1    3. Primary hypertension  Good control  - CBC with Auto Differential; Future  - Comprehensive Metabolic Panel; Future  - losartan (COZAAR) 25 MG tablet; Take 1 tablet by mouth daily  Dispense: 90 tablet; Refill: 1  - metoprolol succinate (TOPROL XL) 50 MG extended release tablet; Take 1 tablet by mouth daily  Dispense: 90 tablet; Refill: 1    4. Anxiety and depression    - FLUoxetine (PROZAC) 20 MG capsule; Take 2 capsules by mouth daily  Dispense: 180 capsule; Refill: 1    5. Thyroid disease    - levothyroxine (SYNTHROID) 50 MCG tablet; Take 1 tablet by mouth daily  Dispense: 90 tablet; Refill: 1    6. Encounter for screening mammogram for malignant neoplasm of breast    - MAM DIGITAL SCREEN W OR WO CAD BILATERAL; Future    7. Encounter for immunization    - Influenza, FLUAD Trivalent, (age 8 y+), IM, Preservative Free, 0.68mL    8. Tobacco use disorder, continuous  Encouraged to stop smoking            Orders Placed This Encounter   Procedures    MAM DIGITAL SCREEN W OR WO CAD BILATERAL     Standing Status:   Future     Standing Expiration  Date:   08/18/2024    Influenza, FLUAD  Trivalent, (age 52 y+), IM, Preservative Free, 0.24mL    Microalbumin / Creatinine Urine Ratio     Standing Status:   Future     Standing Expiration Date:   06/18/2024    CBC with Auto Differential     Standing Status:   Future     Standing Expiration Date:   06/18/2024    Lipid Panel     Standing Status:   Future     Standing Expiration Date:   06/18/2024    Comprehensive Metabolic Panel     Standing Status:   Future     Standing Expiration Date:   06/18/2024    AMB POC HEMOGLOBIN A1C    HM DIABETES FOOT EXAM       RTC 3 months    Shanon Rosser, MD

## 2023-06-19 NOTE — Patient Instructions (Signed)
Patient Education        Influenza (Flu) Vaccine (Inactivated or Recombinant): What You Need to Know  Why get vaccinated?  Influenza vaccine can prevent influenza (flu).  Flu is a contagious disease that spreads around the [place] every year, usually between October and May. Anyone can get the flu, but it is more dangerous for some people. Infants and young children, people 64 years and older, pregnant people, and people with certain health conditions or a weakened immune system are at greatest risk of flu complications.  Pneumonia, bronchitis, sinus infections, and ear infections are examples of flu-related complications. If you have a medical condition, such as heart disease, cancer, or diabetes, flu can make it worse.  Flu can cause fever and chills, sore throat, muscle aches, fatigue, cough, headache, and runny or stuffy nose. Some people may have vomiting and diarrhea, though this is more common in children than adults.  Each year, thousands of people in the [place] die from flu, and many more are hospitalized. Flu vaccine prevents millions of illnesses and flu-related visits to the doctor each year.  Influenza vaccines  CDC recommends everyone 6 months and older get vaccinated every flu season. Children 6 months through 79 years of age may need 2 doses during a single flu season. Everyone else needs only 1 dose each flu season.  It takes about 2 weeks for protection to develop after vaccination.  There are many flu viruses, and they are always changing. Each year a new flu vaccine is made to protect against the influenza viruses believed to be likely to cause disease in the upcoming flu season. Even when the vaccine doesn't exactly match these viruses, it may still provide some protection.  Influenza vaccine does not cause flu.  Influenza vaccine may be given at the same time as other vaccines.  Talk with your health care provider  Tell your vaccination provider if the person getting the  vaccine:  Has had an allergic reaction after a previous dose of influenza vaccine, or has any severe, life-threatening allergies  Has ever had Guillain-Barr Syndrome (also called "GBS")  In some cases, your health care provider may decide to postpone influenza vaccination until a future visit.  Influenza vaccine can be administered at any time during pregnancy. People who are or will be pregnant during influenza season should receive inactivated influenza vaccine.  People with minor illnesses, such as a cold, may be vaccinated. People who are moderately or severely ill should usually wait until they recover before getting influenza vaccine.  Your health care provider can give you more information.  Risks of a vaccine reaction  Soreness, redness, and swelling where the shot is given, fever, muscle aches, and headache can happen after influenza vaccination.  There may be a very small increased risk of Guillain-Barr Syndrome (GBS) after inactivated influenza vaccine (the flu shot).  Young children who get the flu shot along with pneumococcal vaccine (PCV13) and/or DTaP vaccine at the same time might be slightly more likely to have a seizure caused by fever. Tell your health care provider if a child who is getting flu vaccine has ever had a seizure.  People sometimes faint after medical procedures, including vaccination. Tell your provider if you feel dizzy or have vision changes or ringing in the ears.  As with any medicine, there is a very remote chance of a vaccine causing a severe allergic reaction, other serious injury, or death.  What if there is a serious problem?  An allergic reaction could occur after the vaccinated person leaves the clinic. If you see signs of a severe allergic reaction (hives, swelling of the face and throat, difficulty breathing, a fast heartbeat, dizziness, or weakness), call 9-1-1 and get the person to the nearest hospital.  For other signs that concern you, call your health care  provider.  Adverse reactions should be reported to the Vaccine Adverse Event Reporting System (VAERS). Your health care provider will usually file this report, or you can do it yourself. Visit the VAERS website at www.vaers.LAgents.no or call 854-660-1641. VAERS is only for reporting reactions, and VAERS staff members do not give medical advice.  The National Vaccine Injury Compensation Program  The National Vaccine Injury Compensation Program (VICP) is a federal program that was created to compensate people who may have been injured by certain vaccines. Claims regarding alleged injury or death due to vaccination have a time limit for filing, which may be as short as two years. Visit the VICP website at SpiritualWord.at or call (319)210-4594 to learn about the program and about filing a claim.  How can I learn more?  Ask your health care provider.  Call your local or state health department.  Visit the website of the Food and Drug Administration (FDA) for vaccine package inserts and additional information at FinderList.no.  Contact the Centers for Disease Control and Prevention (CDC):  Call 9516661458 (1-800-CDC-INFO) or  Visit CDC's website at BiotechRoom.com.cy.  Vaccine Information Statement  Inactivated Influenza Vaccine  04/08/2020  42 [place]  [id number]  Department of Health and [employer] for Disease Control and Prevention  Many vaccine information statements are available in Spanish and other languages. See PromoAge.com.br.  Hojas de informacin sobre vacunas estn disponibles en espaol y en muchos otros idiomas. Visite PromoAge.com.br.  Care instructions adapted under license by [facility]. If you have questions about a medical condition or this instruction, always ask your healthcare professional. [employer] disclaims any warranty or liability for your use of this information.

## 2023-06-19 NOTE — Progress Notes (Signed)
Patient was administered Flu shot in left deltoid via IM.  Patient tolerated Flu shot well.  Medication information reviewed with patient, patient states understanding. Patient to resume routine medications at home.  Patient given copy of AVS and VIIS with medication information and instructions for home. VIIS reviewed with patient and patient states understanding.       Successful venipuncture in patient's Right AC X 1 sticks.  The patient tolerated this procedure w/o c/o pain or discomfort.

## 2023-06-20 LAB — MICROALBUMIN / CREATININE URINE RATIO
Creatinine, Ur: 252 mg/dL
Microalb, Ur: 12.9 mg/dL
Microalb/Creat Ratio: 51 mg/g — ABNORMAL HIGH (ref 0–30)

## 2023-06-20 LAB — COMPREHENSIVE METABOLIC PANEL
ALT: 16 U/L (ref 12–78)
AST: 10 U/L — ABNORMAL LOW (ref 15–37)
Albumin/Globulin Ratio: 1.4 (ref 1.1–2.2)
Albumin: 4.2 g/dL (ref 3.5–5.0)
Alk Phosphatase: 108 U/L (ref 45–117)
Anion Gap: 6 mmol/L (ref 2–12)
BUN/Creatinine Ratio: 21 — ABNORMAL HIGH (ref 12–20)
BUN: 19 mg/dL (ref 6–20)
CO2: 25 mmol/L (ref 21–32)
Calcium: 9.1 mg/dL (ref 8.5–10.1)
Chloride: 103 mmol/L (ref 97–108)
Creatinine: 0.92 mg/dL (ref 0.55–1.02)
Est, Glom Filt Rate: 67 mL/min/{1.73_m2} (ref >60)
Globulin: 2.9 g/dL (ref 2.0–4.0)
Glucose: 209 mg/dL — ABNORMAL HIGH (ref 65–100)
Potassium: 4.3 mmol/L (ref 3.5–5.1)
Sodium: 134 mmol/L — ABNORMAL LOW (ref 136–145)
Total Bilirubin: 0.5 mg/dL (ref 0.2–1.0)
Total Protein: 7.1 g/dL (ref 6.4–8.2)

## 2023-06-20 LAB — CBC WITH AUTO DIFFERENTIAL
Basophils %: 2 % — ABNORMAL HIGH (ref 0–1)
Basophils Absolute: 0.2 10*3/uL — ABNORMAL HIGH (ref 0.0–0.1)
Eosinophils %: 1 % (ref 0–7)
Eosinophils Absolute: 0.1 10*3/uL (ref 0.0–0.4)
Hematocrit: 46.6 % (ref 35.0–47.0)
Hemoglobin: 15.1 g/dL (ref 11.5–16.0)
Immature Granulocytes %: 0 % (ref 0.0–0.5)
Immature Granulocytes Absolute: 0 10*3/uL (ref 0.00–0.04)
Lymphocytes %: 10 % — ABNORMAL LOW (ref 12–49)
Lymphocytes Absolute: 0.8 10*3/uL (ref 0.8–3.5)
MCH: 28.9 pg (ref 26.0–34.0)
MCHC: 32.4 g/dL (ref 30.0–36.5)
MCV: 89.1 fL (ref 80.0–99.0)
MPV: 9.5 fL (ref 8.9–12.9)
Monocytes %: 7 % (ref 5–13)
Monocytes Absolute: 0.5 10*3/uL (ref 0.0–1.0)
Neutrophils %: 80 % — ABNORMAL HIGH (ref 32–75)
Neutrophils Absolute: 6.3 10*3/uL (ref 1.8–8.0)
Nucleated RBCs: 0 /100{WBCs}
Platelets: 338 10*3/uL (ref 150–400)
RBC: 5.23 M/uL — ABNORMAL HIGH (ref 3.80–5.20)
RDW: 13 % (ref 11.5–14.5)
WBC: 8 10*3/uL (ref 3.6–11.0)
nRBC: 0 10*3/uL (ref 0.00–0.01)

## 2023-06-20 LAB — LIPID PANEL
Chol/HDL Ratio: 7 — ABNORMAL HIGH (ref 0.0–5.0)
Cholesterol, Total: 181 mg/dL (ref <200)
HDL: 26 mg/dL
LDL Cholesterol: 99.8 mg/dL (ref 0–100)
Triglycerides: 276 mg/dL — ABNORMAL HIGH (ref <150)
VLDL Cholesterol Calculated: 55.2 mg/dL

## 2023-06-20 NOTE — Telephone Encounter (Signed)
Pt can't afford Jardiance. Pharmacy said $534.00 per month. With her insurance.  Pt cannot afford that.       Pt would rather take what she has been on, how ever, she will take another medication if it is not so expensive

## 2023-06-21 NOTE — Telephone Encounter (Signed)
I have called and spoke to this patient.  Per Dr Demetrios Loll:    Ask her to check on cost of farxiga 10 mg   Patient tells me that she thinks she is doing fine on what she is doing now.  The reason her #'s where higher was because she was eating a lot of junk.  She has stopped eating junk and wants to try that rather then adding a new medication. She would like to know if you are okay with this and letting her try controlling her diet more.

## 2023-06-21 NOTE — Telephone Encounter (Signed)
Pt calls asking for Kennyth Arnold who is no available. I relayed Dr. Wanda Plump message. Pt verbalizes understanding and states she will check on Farxiga price. It that is still too expensive she will continue with dietary changes and keep 3 month follow up appt

## 2023-09-18 ENCOUNTER — Ambulatory Visit: Payer: BLUE CROSS/BLUE SHIELD | Attending: Geriatric Medicine | Primary: Geriatric Medicine

## 2023-10-04 ENCOUNTER — Ambulatory Visit: Payer: BLUE CROSS/BLUE SHIELD | Attending: Geriatric Medicine | Primary: Geriatric Medicine

## 2023-11-27 ENCOUNTER — Ambulatory Visit: Payer: BLUE CROSS/BLUE SHIELD | Attending: Geriatric Medicine | Primary: Geriatric Medicine

## 2023-12-04 NOTE — Progress Notes (Signed)
 Patient's HM shows they are overdue for Colorectal Screening.   Care Everywhere and [employer] files searched.  No results to attach to order nor HM updated.

## 2023-12-06 ENCOUNTER — Ambulatory Visit
Admit: 2023-12-06 | Discharge: 2023-12-06 | Payer: BLUE CROSS/BLUE SHIELD | Attending: Geriatric Medicine | Primary: Geriatric Medicine

## 2023-12-06 VITALS — BP 143/72 | HR 96 | Temp 97.00000°F | Resp 16 | Ht 67.0 in | Wt 163.1 lb

## 2023-12-06 DIAGNOSIS — E1121 Type 2 diabetes mellitus with diabetic nephropathy: Secondary | ICD-10-CM

## 2023-12-06 LAB — AMB POC HEMOGLOBIN A1C: Hemoglobin A1C, POC: 6.8 %

## 2023-12-06 MED ORDER — EMPAGLIFLOZIN 10 MG PO TABS
10 | ORAL_TABLET | Freq: Every day | ORAL | 1 refills | 30.00000 days | Status: DC
Start: 2023-12-06 — End: 2024-08-31

## 2023-12-06 MED ORDER — LOSARTAN POTASSIUM 50 MG PO TABS
50 | ORAL_TABLET | Freq: Every day | ORAL | 1 refills | Status: DC
Start: 2023-12-06 — End: 2024-08-31

## 2023-12-06 MED ORDER — METOPROLOL SUCCINATE ER 50 MG PO TB24
50 | ORAL_TABLET | Freq: Every day | ORAL | 1 refills | 90.00000 days | Status: DC
Start: 2023-12-06 — End: 2024-08-31

## 2023-12-06 MED ORDER — ATORVASTATIN CALCIUM 80 MG PO TABS
80 | ORAL_TABLET | Freq: Every day | ORAL | 1 refills | 90.00000 days | Status: DC
Start: 2023-12-06 — End: 2024-08-31

## 2023-12-06 MED ORDER — PIOGLITAZONE HCL 45 MG PO TABS
45 | ORAL_TABLET | Freq: Every day | ORAL | 1 refills | Status: DC
Start: 2023-12-06 — End: 2024-08-31

## 2023-12-06 MED ORDER — LEVOTHYROXINE SODIUM 50 MCG PO TABS
50 | ORAL_TABLET | Freq: Every day | ORAL | 1 refills | Status: DC
Start: 2023-12-06 — End: 2024-08-31

## 2023-12-06 MED ORDER — FLUOXETINE HCL 20 MG PO CAPS
20 | ORAL_CAPSULE | Freq: Every day | ORAL | 1 refills | Status: DC
Start: 2023-12-06 — End: 2024-08-31

## 2023-12-06 MED ORDER — GLIPIZIDE 5 MG PO TABS
5 | ORAL_TABLET | Freq: Two times a day (BID) | ORAL | 1 refills | Status: DC
Start: 2023-12-06 — End: 2024-08-31

## 2023-12-06 NOTE — Addendum Note (Signed)
 Addended by: Benito Mccreedy on: 12/06/2023 10:48 AM     Modules accepted: Orders

## 2023-12-06 NOTE — Progress Notes (Signed)
 "Have you been to the ER, urgent care clinic since your last visit?  Hospitalized since your last visit?"    NO    "Have you seen or consulted any other health care providers outside our system since your last visit?"    NO    Have you had a mammogram?"   No, patient aware she is due     Date of last Mammogram: 06/20/2021       "Have you had a colorectal cancer screening such as a colonoscopy/FIT/Cologuard?    Yes - 2024 - unsure of doctors name     No colonoscopy on file  No cologuard on file  No FIT/FOBT on file   No flexible sigmoidoscopy on file     "Have you had a diabetic eye exam?"    No, patient aware she is due      Date of last diabetic eye exam: 07/03/2018     12/06/2023      Chief Complaint   Patient presents with    Hypertension    Diabetes    Cholesterol Problem         History of Present Illness:        Madison Peters is a 73 y.o. female who returns feeling well. Some chronic cough. Tolerating addition of Jardiance.      Allergies   Allergen Reactions    Ace Inhibitors Cough    Erythromycin Other (See Comments)     Does not remember.    Metformin And Related Nausea Only    Penicillins Nausea Only    Amoxicillin Nausea And Vomiting    Sulfa Antibiotics Nausea And Vomiting       Current Outpatient Medications   Medication Sig Dispense Refill    esomeprazole Magnesium (NEXIUM) 20 MG PACK Take 1 packet by mouth daily      losartan (COZAAR) 50 MG tablet Take 1 tablet by mouth daily 90 tablet 1    empagliflozin (JARDIANCE) 10 MG tablet Take 1 tablet by mouth daily 90 tablet 1    FLUoxetine (PROZAC) 20 MG capsule Take 2 capsules by mouth daily 180 capsule 1    pioglitazone (ACTOS) 45 MG tablet Take 1 tablet by mouth daily 90 tablet 1    glipiZIDE (GLUCOTROL) 5 MG tablet Take 1 tablet by mouth 2 times daily 180 tablet 1    levothyroxine (SYNTHROID) 50 MCG tablet Take 1 tablet by mouth daily 90 tablet 1    metoprolol succinate (TOPROL XL) 50 MG extended release tablet Take 1 tablet by mouth daily 90 tablet 1     atorvastatin (LIPITOR) 80 MG tablet Take 1 tablet by mouth daily 90 tablet 1    glucose monitoring (FREESTYLE FREEDOM) kit 1 kit by Does not apply route daily 1 kit 0    blood glucose monitor strips Test 3 times a day & as needed for symptoms of irregular blood glucose. Dispense sufficient amount for indicated testing frequency plus additional to accommodate PRN testing needs. 100 strip 5    Lancets MISC 1 each by Does not apply route daily 100 each 5    acetaminophen (TYLENOL) 500 MG tablet Take by mouth every 6 hours as needed       No current facility-administered medications for this visit.             Physical Examination:    BP (!) 143/72 (BP Site: Left Upper Arm, Patient Position: Sitting, BP Cuff Size: Medium Adult)   Pulse 96   Temp 97  F (36.1 C) (Oral)   Resp 16   Ht 1.702 m (5\' 7" )   Wt 74 kg (163 lb 2 oz)   SpO2 94%   BMI 25.55 kg/m    General:  Alert, cooperative, no distress.   HEENT:  Normocephalic, without obvious abnormality, atraumatic.Conjunctivae/corneas clear. Pupils equal, round, reactive to light. Extraocular movements intact.TMs and external canals normal bilaterally. Nasal mucosa and oropharynx clear.   Lungs: Clear to auscultation bilaterally.   Chest wall:  No tenderness or deformity.   Heart:  Regular rate and rhythm, S1, S2 normal, no murmur, click, rub, or gallop.   Abdomen:   Soft, non-tender. Bowel sounds normal. No masses. No organomegaly.   Extremities: Extremities normal, atraumatic, no cyanosis or edema.   Pulses: 2+ and symmetric all extremities.   Skin: Skin color, texture, turgor normal. No rashes or lesions.   Lymph nodes: Cervical, supraclavicular, and axillary nodes normal.   Neurologic: CNII-XII intact. Normal strength, sensation, and reflexes throughout.     Results for orders placed or performed in visit on 12/06/23   AMB POC HEMOGLOBIN A1C   Result Value Ref Range    Hemoglobin A1C, POC 6.8 %         ASSESSMENT AND PLAN    1. Type 2 diabetes with nephropathy  (HCC)  Improved. Repeat confirmatory alb/cr. Recheck in six months  - AMB POC HEMOGLOBIN A1C  - Albumin/Creatinine Ratio, Urine; Future  - losartan (COZAAR) 50 MG tablet; Take 1 tablet by mouth daily  Dispense: 90 tablet; Refill: 1  - empagliflozin (JARDIANCE) 10 MG tablet; Take 1 tablet by mouth daily  Dispense: 90 tablet; Refill: 1  - pioglitazone (ACTOS) 45 MG tablet; Take 1 tablet by mouth daily  Dispense: 90 tablet; Refill: 1  - glipiZIDE (GLUCOTROL) 5 MG tablet; Take 1 tablet by mouth 2 times daily  Dispense: 180 tablet; Refill: 1  - Albumin/Creatinine Ratio, Urine    2. Tobacco use disorder, continuous  Encouraged to stop smoking.    3. Acquired hypothyroidism    - TSH; Future  - TSH    4. Pulmonary emphysema, unspecified emphysema type (HCC)      5. Colon cancer screening    - Cologuard (Fecal DNA Colorectal Cancer Screening)    6. Primary hypertension  Increase losartan  - losartan (COZAAR) 50 MG tablet; Take 1 tablet by mouth daily  Dispense: 90 tablet; Refill: 1  - metoprolol succinate (TOPROL XL) 50 MG extended release tablet; Take 1 tablet by mouth daily  Dispense: 90 tablet; Refill: 1  - COLLECTION VENOUS BLOOD,VENIPUNCTURE    7. Anxiety and depression  stable  - FLUoxetine (PROZAC) 20 MG capsule; Take 2 capsules by mouth daily  Dispense: 180 capsule; Refill: 1    8. Thyroid disease    - levothyroxine (SYNTHROID) 50 MCG tablet; Take 1 tablet by mouth daily  Dispense: 90 tablet; Refill: 1    9. Hyperlipidemia, unspecified hyperlipidemia type    - atorvastatin (LIPITOR) 80 MG tablet; Take 1 tablet by mouth daily  Dispense: 90 tablet; Refill: 1              Orders Placed This Encounter   Procedures    COLLECTION VENOUS BLOOD,VENIPUNCTURE    Cologuard (Fecal DNA Colorectal Cancer Screening)    TSH     Standing Status:   Future     Number of Occurrences:   1     Expected Date:   12/06/2023     Expiration Date:  12/05/2024    Albumin/Creatinine Ratio, Urine     Standing Status:   Future     Number of  Occurrences:   1     Expected Date:   12/06/2023     Expiration Date:   12/05/2024    AMB POC HEMOGLOBIN A1C       RTC 6 months    Shanon Rosser, MD

## 2023-12-06 NOTE — Progress Notes (Signed)
 Successful venipuncture in patient's right ac X 1 sticks.  The patient tolerated this procedure w/o c/o pain or discomfort.

## 2023-12-07 LAB — TSH: TSH, 3rd Generation: 1.93 u[IU]/mL (ref 0.36–3.74)

## 2023-12-09 ENCOUNTER — Other Ambulatory Visit: Admit: 2023-12-09 | Discharge: 2023-12-09 | Payer: BLUE CROSS/BLUE SHIELD | Primary: Geriatric Medicine

## 2023-12-09 DIAGNOSIS — E1121 Type 2 diabetes mellitus with diabetic nephropathy: Secondary | ICD-10-CM

## 2023-12-09 NOTE — Progress Notes (Signed)
 Urine sample brought to office for Microablumin

## 2023-12-10 LAB — ALBUMIN/CREATININE RATIO, URINE
Albumin Urine: 4.82 mg/dL
Albumin/Creatinine Ratio: 57 mg/g — ABNORMAL HIGH (ref 0–30)
Creatinine, Ur: 83.9 mg/dL

## 2024-05-04 DEATH — deceased

## 2024-06-12 ENCOUNTER — Ambulatory Visit: Payer: BLUE CROSS/BLUE SHIELD | Attending: Geriatric Medicine | Primary: Geriatric Medicine

## 2024-08-31 ENCOUNTER — Ambulatory Visit
Admit: 2024-08-31 | Discharge: 2024-08-31 | Payer: BLUE CROSS/BLUE SHIELD | Attending: Geriatric Medicine | Primary: Geriatric Medicine

## 2024-08-31 VITALS — BP 135/80 | HR 105 | Temp 98.30000°F | Resp 16 | Ht 67.0 in | Wt 160.4 lb

## 2024-08-31 DIAGNOSIS — E1121 Type 2 diabetes mellitus with diabetic nephropathy: Principal | ICD-10-CM

## 2024-08-31 LAB — AMB POC HEMOGLOBIN A1C: Hemoglobin A1C, POC: 7.7 %

## 2024-08-31 MED ORDER — ATORVASTATIN CALCIUM 80 MG PO TABS
80 | ORAL_TABLET | Freq: Every day | ORAL | 1 refills | 90.00000 days | Status: AC
Start: 2024-08-31 — End: ?

## 2024-08-31 MED ORDER — LOSARTAN POTASSIUM 100 MG PO TABS
100 | ORAL_TABLET | Freq: Every day | ORAL | 1 refills | 90.00000 days | Status: AC
Start: 2024-08-31 — End: ?

## 2024-08-31 MED ORDER — PIOGLITAZONE HCL 45 MG PO TABS
45 | ORAL_TABLET | Freq: Every day | ORAL | 1 refills | 90.00000 days | Status: AC
Start: 2024-08-31 — End: ?

## 2024-08-31 MED ORDER — LEVOTHYROXINE SODIUM 50 MCG PO TABS
50 | ORAL_TABLET | Freq: Every day | ORAL | 1 refills | 90.00000 days | Status: AC
Start: 2024-08-31 — End: ?

## 2024-08-31 MED ORDER — FLUOXETINE HCL 20 MG PO CAPS
20 | ORAL_CAPSULE | Freq: Every day | ORAL | 1 refills | 90.00000 days | Status: AC
Start: 2024-08-31 — End: ?

## 2024-08-31 MED ORDER — GLIPIZIDE 5 MG PO TABS
5 | ORAL_TABLET | Freq: Two times a day (BID) | ORAL | 1 refills | 90.00000 days | Status: AC
Start: 2024-08-31 — End: ?

## 2024-08-31 MED ORDER — BENZONATATE 100 MG PO CAPS
100 | ORAL_CAPSULE | Freq: Three times a day (TID) | ORAL | 0 refills | 10.00000 days | Status: AC | PRN
Start: 2024-08-31 — End: 2024-09-10

## 2024-08-31 MED ORDER — METOPROLOL SUCCINATE ER 50 MG PO TB24
50 | ORAL_TABLET | Freq: Every day | ORAL | 1 refills | 90.00000 days | Status: AC
Start: 2024-08-31 — End: ?

## 2024-08-31 MED ORDER — AZITHROMYCIN 250 MG PO TABS
250 | ORAL_TABLET | ORAL | 0 refills | 5.00000 days | Status: AC
Start: 2024-08-31 — End: 2024-09-10

## 2024-08-31 MED ORDER — EMPAGLIFLOZIN 10 MG PO TABS
10 | ORAL_TABLET | Freq: Every day | ORAL | 1 refills | 30.00000 days | Status: AC
Start: 2024-08-31 — End: ?

## 2024-08-31 NOTE — Progress Notes (Signed)
 "Have you been to the ER, urgent care clinic since your last visit?  Hospitalized since your last visit?   NO    Have you seen or consulted any other health care providers outside our system since your last visit?   NO    Have you had a mammogram?   NO    Date of last Mammogram: 06/20/2021       Have you had a colorectal cancer screening such as a colonoscopy/FIT/Cologuard?    NO    No colonoscopy on file  No cologuard on file  No FIT/FOBT on file   No flexible sigmoidoscopy on file     Have you had a diabetic eye exam?    NO     Date of last diabetic eye exam: 07/03/2018     08/31/2024      Chief Complaint   Patient presents with    Diabetes         History of Present Illness:        [name] is a 73 y.o. female returns with one month of cough and congestion. Taking meds without difficulty. Not checking BP.      Allergies   Allergen Reactions    Ace Inhibitors Cough    Erythromycin Other (See Comments)     Does not remember.    Metformin  And Related Nausea Only    Penicillins Nausea Only    Amoxicillin Nausea And Vomiting    Sulfa Antibiotics Nausea And Vomiting       Current Outpatient Medications   Medication Sig Dispense Refill    esomeprazole Magnesium (NEXIUM) 20 MG PACK Take 1 packet by mouth daily      losartan  (COZAAR ) 50 MG tablet Take 1 tablet by mouth daily 90 tablet 1    empagliflozin  (JARDIANCE ) 10 MG tablet Take 1 tablet by mouth daily 90 tablet 1    FLUoxetine  (PROZAC ) 20 MG capsule Take 2 capsules by mouth daily 180 capsule 1    pioglitazone  (ACTOS ) 45 MG tablet Take 1 tablet by mouth daily 90 tablet 1    glipiZIDE  (GLUCOTROL ) 5 MG tablet Take 1 tablet by mouth 2 times daily 180 tablet 1    levothyroxine  (SYNTHROID ) 50 MCG tablet Take 1 tablet by mouth daily 90 tablet 1    metoprolol  succinate (TOPROL  XL) 50 MG extended release tablet Take 1 tablet by mouth daily 90 tablet 1    atorvastatin  (LIPITOR) 80 MG tablet Take 1 tablet by mouth daily 90 tablet 1    glucose monitoring (FREESTYLE FREEDOM)  kit 1 kit by Does not apply route daily 1 kit 0    blood glucose monitor strips Test 3 times a day & as needed for symptoms of irregular blood glucose. Dispense sufficient amount for indicated testing frequency plus additional to accommodate PRN testing needs. 100 strip 5    Lancets MISC 1 each by Does not apply route daily 100 each 5    acetaminophen (TYLENOL) 500 MG tablet Take by mouth every 6 hours as needed       No current facility-administered medications for this visit.             Physical Examination:    BP (!) 160/80 (BP Site: Left Upper Arm, Patient Position: Sitting, BP Cuff Size: Medium Adult)   Pulse (!) 105   Temp 98.3 F (36.8 C) (Oral)   Resp 16   Ht 1.702 m (5' 7)   Wt 72.7 kg (160 lb 6 oz)  SpO2 93%   BMI 25.12 kg/m    General:  Alert, cooperative, no distress.   HEENT:  Normocephalic, without obvious abnormality, atraumatic.Conjunctivae/corneas clear. Pupils equal, round, reactive to light. Extraocular movements intact.TMs and external canals normal bilaterally. Nasal mucosa and oropharynx clear.   Lungs: Clear to auscultation bilaterally.   Chest wall:  No tenderness or deformity.   Heart:  Regular rate and rhythm, S1, S2 normal, no murmur, click, rub, or gallop.   Abdomen:   Soft, non-tender. Bowel sounds normal. No masses. No organomegaly.   Extremities: Extremities normal, atraumatic, no cyanosis or edema.   Pulses: 2+ and symmetric all extremities.   Skin: Skin color, texture, turgor normal. No rashes or lesions.   Lymph nodes: Cervical, supraclavicular, and axillary nodes normal.   Neurologic: CNII-XII intact. Normal strength, sensation, and reflexes throughout.     Results for orders placed or performed in visit on 08/31/24   AMB POC HEMOGLOBIN A1C   Result Value Ref Range    Hemoglobin A1C, POC 7.7 %         ASSESSMENT AND PLAN    1. Type 2 diabetes with nephropathy (HCC)  Discussed concentrated sweets. Recheck in 3 months  - AMB POC HEMOGLOBIN A1C  - HM DIABETES FOOT EXAM  -  empagliflozin  (JARDIANCE ) 10 MG tablet; Take 1 tablet by mouth daily  Dispense: 90 tablet; Refill: 1  - pioglitazone  (ACTOS ) 45 MG tablet; Take 1 tablet by mouth daily  Dispense: 90 tablet; Refill: 1  - glipiZIDE  (GLUCOTROL ) 5 MG tablet; Take 1 tablet by mouth 2 times daily  Dispense: 180 tablet; Refill: 1  - losartan  (COZAAR ) 100 MG tablet; Take 1 tablet by mouth daily  Dispense: 90 tablet; Refill: 1    2. Tobacco use disorder, continuous  Encouraged to stop smoking    3. Primary hypertension  Increase losartan . Recheck in 3 months  - losartan  (COZAAR ) 100 MG tablet; Take 1 tablet by mouth daily  Dispense: 90 tablet; Refill: 1  - metoprolol  succinate (TOPROL  XL) 50 MG extended release tablet; Take 1 tablet by mouth daily  Dispense: 90 tablet; Refill: 1    4. Colon cancer screening    - Cologuard (Fecal DNA Colorectal Cancer Screening)    5. Mixed hyperlipidemia    - Lipid Panel; Future  - Lipid Panel    6. Anxiety and depression    - FLUoxetine  (PROZAC ) 20 MG capsule; Take 2 capsules by mouth daily  Dispense: 180 capsule; Refill: 1    7. Acquired hypothyroidism    - levothyroxine  (SYNTHROID ) 50 MCG tablet; Take 1 tablet by mouth daily  Dispense: 90 tablet; Refill: 1    8. Hyperlipidemia, unspecified hyperlipidemia type    - atorvastatin  (LIPITOR) 80 MG tablet; Take 1 tablet by mouth daily  Dispense: 90 tablet; Refill: 1    9. Encounter for immunization    - Influenza, FLUAD Trivalent, (age 52 y+), IM, Preservative Free, 0.5mL    10. 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

## 2024-08-31 NOTE — Progress Notes (Signed)
"  Successful venipuncture in patient's Left AC X 1 sticks.  The patient tolerated this procedure w/o c/o pain or discomfort.  "

## 2024-09-01 LAB — LIPID PANEL
Chol/HDL Ratio: 5.3 — ABNORMAL HIGH (ref 0.0–5.0)
Cholesterol, Total: 184 mg/dL (ref 0–200)
HDL: 35 mg/dL — ABNORMAL LOW (ref 40–60)
LDL Cholesterol: 119 mg/dL — ABNORMAL HIGH (ref 0–100)
Triglycerides: 155 mg/dL — ABNORMAL HIGH (ref 0–150)
VLDL Cholesterol Calculated: 31 mg/dL

## 2024-09-04 ENCOUNTER — Ambulatory Visit: Admit: 2024-09-04 | Discharge: 2024-09-04 | Payer: BLUE CROSS/BLUE SHIELD | Primary: Geriatric Medicine

## 2024-09-04 VITALS — BP 142/98 | HR 99 | Temp 98.40000°F | Resp 16 | Ht 67.0 in | Wt 159.4 lb

## 2024-09-04 DIAGNOSIS — H6123 Impacted cerumen, bilateral: Principal | ICD-10-CM

## 2024-09-04 MED ORDER — NEOMYCIN-POLYMYXIN-HC 3.5-10000-1 OT SOLN
3.5-10000-1 | Freq: Four times a day (QID) | OTIC | 0 refills | Status: DC
Start: 2024-09-04 — End: 2024-09-04

## 2024-09-04 NOTE — Progress Notes (Signed)
 "    Madison Peters (DOB:  [birth year]) is a 74 y.o. female,Established patient, here for evaluation of the following chief complaint(s):  Hearing Problem (Was trying to get wax out of her ears and must have pushed it in and now she can not hear.)      Subjective   History of Present Illness  Ms. Tregoning is a 74 year old female who presents with complaint of needing her ears cleaned. Started 2 to 3 days used a q tip and now I cant hear at all. She states she used a Q tip to cleaned her and she thinks she pushed and packed the wax in her ears. I was digging trying to get the was out. Both ears are tender the right more so than the left.    Allergies   Allergen Reactions    Ace Inhibitors Cough    Erythromycin Other (See Comments)     Does not remember.    Metformin  And Related Nausea Only    Penicillins Nausea Only    Amoxicillin Nausea And Vomiting    Sulfa Antibiotics Nausea And Vomiting       Current Outpatient Medications   Medication Sig Dispense Refill    neomycin -polymyxin-hydrocortisone (CORTISPORIN) 3.5-10000-1 otic solution Place 4 drops into the right ear 4 times daily for 10 days Instill into right Ear. 8 mL 0    empagliflozin  (JARDIANCE ) 10 MG tablet Take 1 tablet by mouth daily 90 tablet 1    FLUoxetine  (PROZAC ) 20 MG capsule Take 2 capsules by mouth daily 180 capsule 1    pioglitazone  (ACTOS ) 45 MG tablet Take 1 tablet by mouth daily 90 tablet 1    glipiZIDE  (GLUCOTROL ) 5 MG tablet Take 1 tablet by mouth 2 times daily 180 tablet 1    levothyroxine  (SYNTHROID ) 50 MCG tablet Take 1 tablet by mouth daily 90 tablet 1    losartan  (COZAAR ) 100 MG tablet Take 1 tablet by mouth daily 90 tablet 1    metoprolol  succinate (TOPROL  XL) 50 MG extended release tablet Take 1 tablet by mouth daily 90 tablet 1    atorvastatin  (LIPITOR) 80 MG tablet Take 1 tablet by mouth daily 90 tablet 1    azithromycin  (ZITHROMAX ) 250 MG tablet 500mg  on day 1 followed by 250mg  on days 2 - 5 6 tablet 0    benzonatate  (TESSALON ) 100 MG  capsule Take 1 capsule by mouth 3 times daily as needed for Cough 30 capsule 0    esomeprazole Magnesium (NEXIUM) 20 MG PACK Take 1 packet by mouth daily      glucose monitoring (FREESTYLE FREEDOM) kit 1 kit by Does not apply route daily 1 kit 0    blood glucose monitor strips Test 3 times a day & as needed for symptoms of irregular blood glucose. Dispense sufficient amount for indicated testing frequency plus additional to accommodate PRN testing needs. 100 strip 5    Lancets MISC 1 each by Does not apply route daily 100 each 5    acetaminophen (TYLENOL) 500 MG tablet Take by mouth every 6 hours as needed       No current facility-administered medications for this visit.        Past Medical History:   Diagnosis Date    Acquired hypothyroidism     hypothryoidism         Anxiety     Dyspepsia     Emphysema     Essential hypertension     Headache(784.0)  Kidney stones     Lipoma     Left calf    Thyroid disease     hypothryoidism    Type 2 diabetes mellitus with microalbuminuria, without long-term current use of insulin (HCC)        Family History   Problem Relation Age of Onset    Heart Disease Mother     Other Father         AAA       Review of Systems    A comprehensive review of systems was negative except for that written in the HPI.       Objective     Physical Exam  Constitutional:       Appearance: Normal appearance.   HENT:      Head: Normocephalic and atraumatic.      Right Ear: Tympanic membrane, ear canal and external ear normal. There is no impacted cerumen.      Left Ear: Tympanic membrane, ear canal and external ear normal. There is no impacted cerumen.      Ears:      Comments: Bilateral ears cleared with irrigation and Curettage. Right ear noted to be irritated notes visible blood in the inner canal. Bilateral TMs are intact.     Nose: Nose normal. No congestion or rhinorrhea.      Mouth/Throat:      Mouth: Mucous membranes are moist.      Pharynx: Oropharynx is clear. No oropharyngeal exudate or  posterior oropharyngeal erythema.   Eyes:      General: No scleral icterus.        Right eye: No discharge.         Left eye: No discharge.      Extraocular Movements: Extraocular movements intact.      Conjunctiva/sclera: Conjunctivae normal.      Pupils: Pupils are equal, round, and reactive to light.   Cardiovascular:      Rate and Rhythm: Normal rate and regular rhythm.      Pulses: Normal pulses.      Heart sounds: Normal heart sounds. No murmur heard.     No friction rub. No gallop.   Pulmonary:      Effort: Pulmonary effort is normal. No respiratory distress.      Breath sounds: Normal breath sounds. No stridor. No wheezing, rhonchi or rales.   Chest:      Chest wall: No tenderness.   Musculoskeletal:      Cervical back: Normal range of motion and neck supple. No rigidity or tenderness.      Right lower leg: No edema.      Left lower leg: No edema.   Lymphadenopathy:      Cervical: No cervical adenopathy.   Skin:     General: Skin is warm and dry.      Coloration: Skin is not jaundiced or pale.      Findings: No bruising, erythema, lesion or rash.   Neurological:      General: No focal deficit present.      Mental Status: She is alert.   Psychiatric:         Mood and Affect: Mood normal.         Behavior: Behavior normal.          Vitals:    09/04/24 1337   BP: (!) 142/98   BP Site: Right Upper Arm   Patient Position: Sitting   BP Cuff Size: Medium Adult   Pulse: 99  Resp: 16   Temp: 98.4 F (36.9 C)   TempSrc: Oral   SpO2: 95%   Weight: 72.3 kg (159 lb 6.4 oz)   Height: 1.702 m (5' 7)        No results found for this visit on 09/04/24.         Assessment & Plan    Assessment & Plan  Bilateral impacted cerumen   Bilateral ears cleared with irrigation and Curettage.  Right ear canal noted blood/irritated   Orders:    REMOVAL IMPACTED CERUMEN IRRIGATION/LVG UNILAT    Abrasion of right ear with infection, initial encounter   Right ear canal noted blood/irritated     Orders:     neomycin -polymyxin-hydrocortisone (CORTISPORIN) 3.5-10000-1 otic solution; Place 4 drops into the right ear 4 times daily for 10 days Instill into right Ear.      Return if symptoms worsen or fail to improve.             An electronic signature was used to authenticate this note.    --Torrence Arts, APRN - NP    "

## 2024-09-04 NOTE — Progress Notes (Signed)
"  Have you been to the ER, urgent care clinic since your last visit?  Hospitalized since your last visit?   NO    Have you seen or consulted any other health care providers outside our system since your last visit?   NO    Have you had a mammogram?   NO    Date of last Mammogram: 06/20/2021       Have you had a colorectal cancer screening such as a colonoscopy/FIT/Cologuard?    NO    No colonoscopy on file  No cologuard on file  No FIT/FOBT on file   No flexible sigmoidoscopy on file     Have you had a diabetic eye exam?    NO     Date of last diabetic eye exam: 07/03/2018       BL ear lavage completed.  Warm water and Hydrogen peroxide mix used to remove cerumen from ears BL.  Right Canal cleared after 5 lavages and left ear cleared after 5 lavages. Patient tolerated well.  "

## 2024-09-05 MED ORDER — NEOMYCIN-POLYMYXIN-HC 3.5-10000-1 OT SOLN
3.5-10000-1 | Freq: Four times a day (QID) | OTIC | 0 refills | Status: AC
Start: 2024-09-05 — End: 2024-09-14

## 2024-10-01 NOTE — Telephone Encounter (Signed)
 Patient returns call.  I have read her the lab result letter from Dr Elwood.  Patient verbalizes understanding.

## 2024-10-01 NOTE — Telephone Encounter (Signed)
 Pt asks for Glade to call her she has a question about her recent test results    [phone]

## 2024-10-01 NOTE — Telephone Encounter (Signed)
 I was talking to this patient and the phone disconnected.  I have tried to call her back and the phone will not allow the call to go through.
